# Patient Record
Sex: Female | Born: 1955 | Race: Black or African American | Hispanic: No | Smoking: Never smoker
Health system: Southern US, Community
[De-identification: ages and names within clinical notes are randomized; demographics above are authoritative.]

## PROBLEM LIST (undated history)

## (undated) DIAGNOSIS — Z8489 Family history of other specified conditions: Secondary | ICD-10-CM

## (undated) DIAGNOSIS — D649 Anemia, unspecified: Secondary | ICD-10-CM

## (undated) DIAGNOSIS — M542 Cervicalgia: Secondary | ICD-10-CM

## (undated) DIAGNOSIS — R51 Headache: Secondary | ICD-10-CM

## (undated) DIAGNOSIS — K219 Gastro-esophageal reflux disease without esophagitis: Secondary | ICD-10-CM

## (undated) DIAGNOSIS — I10 Essential (primary) hypertension: Secondary | ICD-10-CM

## (undated) DIAGNOSIS — K589 Irritable bowel syndrome without diarrhea: Secondary | ICD-10-CM

## (undated) DIAGNOSIS — I341 Nonrheumatic mitral (valve) prolapse: Secondary | ICD-10-CM

## (undated) DIAGNOSIS — O24419 Gestational diabetes mellitus in pregnancy, unspecified control: Secondary | ICD-10-CM

## (undated) DIAGNOSIS — G709 Myoneural disorder, unspecified: Secondary | ICD-10-CM

## (undated) DIAGNOSIS — F419 Anxiety disorder, unspecified: Secondary | ICD-10-CM

## (undated) DIAGNOSIS — R002 Palpitations: Secondary | ICD-10-CM

## (undated) HISTORY — DX: Essential (primary) hypertension: I10

## (undated) HISTORY — DX: Cervicalgia: M54.2

## (undated) HISTORY — DX: Irritable bowel syndrome, unspecified: K58.9

## (undated) HISTORY — DX: Headache: R51

## (undated) HISTORY — DX: Anxiety disorder, unspecified: F41.9

## (undated) HISTORY — DX: Gastro-esophageal reflux disease without esophagitis: K21.9

## (undated) HISTORY — PX: LAPAROSCOPY: SHX197

## (undated) HISTORY — DX: Gestational diabetes mellitus in pregnancy, unspecified control: O24.419

## (undated) HISTORY — DX: Nonrheumatic mitral (valve) prolapse: I34.1

## (undated) HISTORY — DX: Palpitations: R00.2

## (undated) HISTORY — DX: Anemia, unspecified: D64.9

## (undated) HISTORY — PX: OTHER SURGICAL HISTORY: SHX169

---

## 1997-11-22 ENCOUNTER — Other Ambulatory Visit: Admission: RE | Admit: 1997-11-22 | Discharge: 1997-11-22 | Payer: Self-pay | Admitting: *Deleted

## 1999-06-05 ENCOUNTER — Other Ambulatory Visit: Admission: RE | Admit: 1999-06-05 | Discharge: 1999-06-05 | Payer: Self-pay | Admitting: Gynecology

## 2000-07-08 ENCOUNTER — Encounter: Payer: Self-pay | Admitting: Pulmonary Disease

## 2000-07-08 ENCOUNTER — Ambulatory Visit (HOSPITAL_COMMUNITY): Admission: RE | Admit: 2000-07-08 | Discharge: 2000-07-08 | Payer: Self-pay | Admitting: Pulmonary Disease

## 2002-01-31 ENCOUNTER — Other Ambulatory Visit: Admission: RE | Admit: 2002-01-31 | Discharge: 2002-01-31 | Payer: Self-pay | Admitting: *Deleted

## 2003-03-14 ENCOUNTER — Other Ambulatory Visit: Admission: RE | Admit: 2003-03-14 | Discharge: 2003-03-14 | Payer: Self-pay | Admitting: *Deleted

## 2003-07-01 ENCOUNTER — Ambulatory Visit (HOSPITAL_COMMUNITY): Admission: RE | Admit: 2003-07-01 | Discharge: 2003-07-01 | Payer: Self-pay | Admitting: Gastroenterology

## 2004-01-05 HISTORY — PX: COLONOSCOPY: SHX174

## 2004-10-10 ENCOUNTER — Encounter: Admission: RE | Admit: 2004-10-10 | Discharge: 2004-10-10 | Payer: Self-pay | Admitting: *Deleted

## 2005-06-09 ENCOUNTER — Other Ambulatory Visit: Admission: RE | Admit: 2005-06-09 | Discharge: 2005-06-09 | Payer: Self-pay | Admitting: Obstetrics and Gynecology

## 2006-02-22 ENCOUNTER — Ambulatory Visit: Payer: Self-pay | Admitting: Pulmonary Disease

## 2006-03-01 ENCOUNTER — Ambulatory Visit: Payer: Self-pay | Admitting: Pulmonary Disease

## 2006-03-01 LAB — CONVERTED CEMR LAB
AST: 25 units/L (ref 0–37)
Albumin: 3.7 g/dL (ref 3.5–5.2)
Basophils Absolute: 0 10*3/uL (ref 0.0–0.1)
Bilirubin, Direct: 0.1 mg/dL (ref 0.0–0.3)
Chloride: 108 meq/L (ref 96–112)
Cholesterol: 184 mg/dL (ref 0–200)
Eosinophils Absolute: 0.1 10*3/uL (ref 0.0–0.6)
Eosinophils Relative: 3.8 % (ref 0.0–5.0)
GFR calc non Af Amer: 93 mL/min
Glucose, Bld: 97 mg/dL (ref 70–99)
HCT: 40.6 % (ref 36.0–46.0)
Hemoglobin: 14.2 g/dL (ref 12.0–15.0)
Lymphocytes Relative: 53.6 % — ABNORMAL HIGH (ref 12.0–46.0)
MCHC: 34.9 g/dL (ref 30.0–36.0)
MCV: 92.7 fL (ref 78.0–100.0)
Monocytes Absolute: 0.2 10*3/uL (ref 0.2–0.7)
Neutro Abs: 1.3 10*3/uL — ABNORMAL LOW (ref 1.4–7.7)
Neutrophils Relative %: 36.3 % — ABNORMAL LOW (ref 43.0–77.0)
Potassium: 4.6 meq/L (ref 3.5–5.1)
RBC: 4.37 M/uL (ref 3.87–5.11)
Sodium: 143 meq/L (ref 135–145)
TSH: 1.57 microintl units/mL (ref 0.35–5.50)
Total Bilirubin: 0.7 mg/dL (ref 0.3–1.2)
Total CHOL/HDL Ratio: 2.6
WBC: 3.5 10*3/uL — ABNORMAL LOW (ref 4.5–10.5)

## 2006-08-16 ENCOUNTER — Emergency Department (HOSPITAL_COMMUNITY): Admission: EM | Admit: 2006-08-16 | Discharge: 2006-08-16 | Payer: Self-pay | Admitting: Emergency Medicine

## 2007-03-24 DIAGNOSIS — K589 Irritable bowel syndrome without diarrhea: Secondary | ICD-10-CM | POA: Insufficient documentation

## 2007-03-24 DIAGNOSIS — F411 Generalized anxiety disorder: Secondary | ICD-10-CM | POA: Insufficient documentation

## 2007-03-24 DIAGNOSIS — J309 Allergic rhinitis, unspecified: Secondary | ICD-10-CM | POA: Insufficient documentation

## 2007-03-24 DIAGNOSIS — K219 Gastro-esophageal reflux disease without esophagitis: Secondary | ICD-10-CM | POA: Insufficient documentation

## 2007-03-24 DIAGNOSIS — Z8679 Personal history of other diseases of the circulatory system: Secondary | ICD-10-CM | POA: Insufficient documentation

## 2007-03-24 DIAGNOSIS — O99810 Abnormal glucose complicating pregnancy: Secondary | ICD-10-CM | POA: Insufficient documentation

## 2007-03-27 ENCOUNTER — Telehealth (INDEPENDENT_AMBULATORY_CARE_PROVIDER_SITE_OTHER): Payer: Self-pay | Admitting: *Deleted

## 2007-04-17 ENCOUNTER — Telehealth (INDEPENDENT_AMBULATORY_CARE_PROVIDER_SITE_OTHER): Payer: Self-pay | Admitting: *Deleted

## 2008-02-15 ENCOUNTER — Encounter: Payer: Self-pay | Admitting: Pulmonary Disease

## 2008-12-25 ENCOUNTER — Ambulatory Visit: Payer: Self-pay | Admitting: Pulmonary Disease

## 2008-12-25 DIAGNOSIS — M542 Cervicalgia: Secondary | ICD-10-CM | POA: Insufficient documentation

## 2008-12-25 DIAGNOSIS — K5900 Constipation, unspecified: Secondary | ICD-10-CM | POA: Insufficient documentation

## 2008-12-25 DIAGNOSIS — R002 Palpitations: Secondary | ICD-10-CM | POA: Insufficient documentation

## 2008-12-25 DIAGNOSIS — D649 Anemia, unspecified: Secondary | ICD-10-CM | POA: Insufficient documentation

## 2008-12-25 DIAGNOSIS — R519 Headache, unspecified: Secondary | ICD-10-CM | POA: Insufficient documentation

## 2008-12-25 DIAGNOSIS — R51 Headache: Secondary | ICD-10-CM | POA: Insufficient documentation

## 2009-01-14 ENCOUNTER — Emergency Department (HOSPITAL_COMMUNITY): Admission: EM | Admit: 2009-01-14 | Discharge: 2009-01-14 | Payer: Self-pay | Admitting: Emergency Medicine

## 2009-08-20 ENCOUNTER — Telehealth (INDEPENDENT_AMBULATORY_CARE_PROVIDER_SITE_OTHER): Payer: Self-pay | Admitting: *Deleted

## 2010-01-01 ENCOUNTER — Ambulatory Visit (HOSPITAL_COMMUNITY)
Admission: RE | Admit: 2010-01-01 | Discharge: 2010-01-01 | Payer: Self-pay | Source: Home / Self Care | Attending: Obstetrics and Gynecology | Admitting: Obstetrics and Gynecology

## 2010-02-03 NOTE — Progress Notes (Signed)
Summary: Company secretary Note From Other Clinic   Caller: Dr. Kriste Basque Call For: Dr. Kriste Basque Summary of Call: Received call from [place] with Dr. Milinda Hirschfeld.  States pt is currently in the dental chair but per Cala Bradford pt is stating she needs to take abx prior to dental appt for mitral valve prolapse.  Pt is there for probing of gum tissue and possible filling/crown.  Cala Bradford calling for clarrifiaction of this.   **Her number [phone] ask for her or Jodie.   Initial call taken by: Gweneth Dimitri RN,  August 20, 2009 11:42 AM  Follow-up for Phone Call        per SN: record reviewed.  dx of MVP.  does not need an abx.  called spoke with Jodie with SN's recs.  Jodie read this back to me and verbalized her understanding. Follow-up by: Boone Master CNA/MA,  August 20, 2009 12:03 PM

## 2010-02-17 ENCOUNTER — Telehealth (INDEPENDENT_AMBULATORY_CARE_PROVIDER_SITE_OTHER): Payer: Self-pay | Admitting: *Deleted

## 2010-02-19 ENCOUNTER — Ambulatory Visit: Payer: Self-pay | Admitting: Pulmonary Disease

## 2010-02-23 ENCOUNTER — Ambulatory Visit (INDEPENDENT_AMBULATORY_CARE_PROVIDER_SITE_OTHER): Payer: Managed Care, Other (non HMO) | Admitting: Pulmonary Disease

## 2010-02-23 ENCOUNTER — Encounter: Payer: Self-pay | Admitting: Pulmonary Disease

## 2010-02-23 DIAGNOSIS — Z8679 Personal history of other diseases of the circulatory system: Secondary | ICD-10-CM

## 2010-02-23 DIAGNOSIS — K219 Gastro-esophageal reflux disease without esophagitis: Secondary | ICD-10-CM

## 2010-02-23 DIAGNOSIS — D649 Anemia, unspecified: Secondary | ICD-10-CM

## 2010-02-23 DIAGNOSIS — R002 Palpitations: Secondary | ICD-10-CM

## 2010-02-23 DIAGNOSIS — J309 Allergic rhinitis, unspecified: Secondary | ICD-10-CM

## 2010-02-23 DIAGNOSIS — K5900 Constipation, unspecified: Secondary | ICD-10-CM

## 2010-02-23 DIAGNOSIS — K589 Irritable bowel syndrome without diarrhea: Secondary | ICD-10-CM

## 2010-02-23 DIAGNOSIS — F411 Generalized anxiety disorder: Secondary | ICD-10-CM

## 2010-02-25 NOTE — Progress Notes (Signed)
Summary: refill pt is out-LMTCB x 1  Phone Note Call from Patient Call back at [phone]   Caller: Patient Call For: nadel Summary of Call: needs refill on atenolol [facility] [address] pt is out  Initial call taken by: Lacinda Axon,  February 17, 2010 12:52 PM  Follow-up for Phone Call        Not seen since 2010, needs appt- LMTCB x 1 Vernie Murders  February 17, 2010 1:47 PM  Spoek with pt and notified needs ov and will refill meds to last until that date.  Pt verbalized understanding.  Appt was sched for 02/19/10 at 11:30 am and refilled atenolol to last until that date.  Follow-up by: Vernie Murders,  February 17, 2010 4:36 PM    Prescriptions: ATENOLOL 50 MG  TABS (ATENOLOL) take 1 tab by mouth once daily  #4 x 0   Entered by:   Vernie Murders   Authorized by:   Michele Mcalpine MD   Signed by:   Vernie Murders on 02/17/2010   Method used:   Electronically to        [facility]  [id number] ([facility])       [address]       [place], [place]  [address]       Ph: [phone] or [phone]       Fax: [phone]   RxID:   [id number]

## 2010-02-27 ENCOUNTER — Other Ambulatory Visit: Payer: Managed Care, Other (non HMO)

## 2010-03-12 NOTE — Assessment & Plan Note (Signed)
Summary: followup//lmr   CC:  14 month ROV & review of mult medical problems....  History of Present Illness: 55 y/o BF here for a follow up visit...    ~  December 25, 2008:  she was last seen 2/08 for refill of her Atenolol for palpit... she has been out of work & still no insurance for full eval or CPX... she notes that she has been doing satis overall- no new complaints or concerns... she would like to get the Aten refilled & she uses OTC Prevacid, Calcium, MVI, Vit D...   ~  February 23, 2010:  46mo ROV- CC not resting well & fair energy;  requests refills for 90d supplies...    Hx MVP w/ occas palpit on Aten50 & doing well she notes;  using Pev15 OTC Prn for reflux symptoms;  GI per [facility] & pt encouraged to f/u w/ her egarding routine screening colonoscopy;  she still sees DrStringer for GYN- Pap, Mammogram, etc are all OK per pt hx;  remote hx neck pain & HAs but doing well w/o recurrent problems apparent;  ?prev hx MS? & she denies neuro problems at this time... she is requested to go to the lab for FASTING blood work, but she declined to do these tests for $$ reasons...   Current Problems:   ALLERGIC RHINITIS (ICD-477.9) - she uses OTC antihist Prn...  MITRAL VALVE PROLAPSE, HX OF (ICD-V12.50), & PALPITATIONS (ICD-785.1) - on ATENOLOL 50mg /d... prev Holter monitor 2005 showed PAC's/ PVC's...  GERD (ICD-530.81) - prev on Aciphex 20mg /d... eval by DrNat-Mann  in 2006 w/ ?EGD at [facility] (we don't have records)... she had EGD 8/02 by Dorris Singh w/ esophagiis seen...  ~  1/10:  she wants cheaper med- try PREVACID 24H OTC daily...   IRRITABLE BOWEL SYNDROME (ICD-564.1), & CONSTIPATION (ICD-564.00) - eval by DrNat-Man 2006.  ~  2/12:  pt is requested to contact DrNat-Mann for needed screening colonoscopy...  Hx of GESTATIONAL DIABETES (ICD-648.80) - GYN= DrStringer & seen 2009 w/ PAP, Mammogram, & Rx w/ Calcium, MVI, Vit D...  ? of MULTIPLE SCLEROSIS (ICD-340) - she presented w/  dysesthesias in 1995 w/ eval by DrSteifel... MRI said to be compatible w/ MS & hosp for IV Solumedrol at that time... hx of being seen at [facility] in the [facility] w/ LP that was also consistant w/ MS... no problems since then x occas sensory symptoms- DrStiefel released her in 1997...  Hx of HEADACHE (ICD-784.0) - eval by drAdelman 1993- neg MRI x ectopic Cbll tissue extending below the level of the foramen magnum... Rx'd w/ SOMA & improved...  Hx of NECK PAIN (ICD-723.1) - she developed neck pain 2007 w/ eval DrKramer for Ortho, and saw DrJenkins for Neurosurg- Dx'd cerv strain & MRI showed few disc bulges & signal abnormalities r/o demyelinating process like MS (she didn't tell DrJenkins about her prev hx as above)...  ANXIETY (ICD-300.00)  Hx of ANEMIA (ICD-285.9)  ~  labs 2/08 showed Hg= 14.2  Health Maintenance - takes OMEGA-3 Fish Oil   Preventive Screening-Counseling & Management  Alcohol-Tobacco     Smoking Status: quit  Comments: pt stated that she tried smoking as a teenager but has not since then  Allergies: 1)  ! Aspirin 2)  ! Cipro  Comments:  Nurse/Medical Assistant: The patient's medications and allergies were reviewed with the patient and were updated in the Medication and Allergy Lists.  Past History:  Past Medical History: ALLERGIC RHINITIS (ICD-477.9) MITRAL VALVE PROLAPSE, HX OF (ICD-V12.50) PALPITATIONS (  ICD-785.1) GERD (ICD-530.81) IRRITABLE BOWEL SYNDROME (ICD-564.1) CONSTIPATION (ICD-564.00) Hx of GESTATIONAL DIABETES (ICD-648.80) ? of MULTIPLE SCLEROSIS (ICD-340) Hx of HEADACHE (ICD-784.0) Hx of NECK PAIN (ICD-723.1) ANXIETY (ICD-300.00) Hx of ANEMIA (ICD-285.9)  Family History: Reviewed history and no changes required.  Social History: Reviewed history and no changes required.  Review of Systems      See HPI  The patient denies anorexia, fever, weight loss, weight gain, vision loss, decreased hearing, hoarseness, chest pain, syncope, dyspnea  on exertion, peripheral edema, prolonged cough, headaches, hemoptysis, abdominal pain, melena, hematochezia, severe indigestion/heartburn, hematuria, incontinence, muscle weakness, suspicious skin lesions, transient blindness, difficulty walking, depression, unusual weight change, abnormal bleeding, enlarged lymph nodes, and angioedema.    Vital Signs:  Patient profile:   55 year old female Height:      62.5 inches Weight:      143 pounds BMI:     25.83 O2 Sat:      100 % on Room air Temp:     96.7 degrees F oral Pulse rate:   55 / minute BP sitting:   120 / 82  (left arm) Cuff size:   regular  Vitals Entered By: Randell Loop CMA (February 23, 2010 11:41 AM)  O2 Sat at Rest %:  100 O2 Flow:  Room air CC: 14 month ROV & review of mult medical problems... Is Patient Diabetic? No Pain Assessment Patient in pain? no      Comments no changes in meds today   Physical Exam  Additional Exam:  WD, WN, 55 y/o BF in NAD... GENERAL:  Alert & oriented; pleasant & cooperative... HEENT:  [place]/AT, EOM-wnl, PERRLA, EACs-clear, TMs-wnl, NOSE-clear, THROAT-clear & wnl. NECK:  Supple w/ full ROM; no JVD; normal carotid impulses w/o bruits; no thyromegaly or nodules palpated; no lymphadenopathy. CHEST:  Clear to P & A; without wheezes/ rales/ or rhonchi. HEART:  Regular Rhythm; without murmurs/ rubs/ or gallops. ABDOMEN:  Soft & nontender; normal bowel sounds; no organomegaly or masses detected. EXT: without deformities or arthritic changes; no varicose veins/ venous insuffic/ or edema. NEURO:  CN's intact; motor testing normal; sensory testing normal; gait normal & balance OK. DERM:  No lesions noted; no rash etc...    Impression & Recommendations:  Problem # 1:  PALPITATIONS (ICD-785.1) She has been stable on Aten50> w/o recurrent palpit... she wishes to contiue as is & requests refill perscription- OK. Her updated medication list for this problem includes:    Atenolol 50 Mg Tabs  (Atenolol) .Marland Kitchen... Take 1 tab by mouth once daily  Problem # 2:  GERD (ICD-530.81) She uses OTC Prev15 as needed... Her updated medication list for this problem includes:    Prevacid 24hr 15 Mg Cpdr (Lansoprazole) .Marland Kitchen... Take one tab by mouth once daily as directed for stomach acid...  Problem # 3:  IRRITABLE BOWEL SYNDROME (ICD-564.1) She is reminded to contact DrNat-Mann, GI > for needed screening colonoscopy...  Problem # 4:  ? of MULTIPLE SCLEROSIS (ICD-340) She deies Neuro sensory or motor symptoms...  Problem # 5:  OTHER MEDICAL DIAGNOSES AS LISTED>>>  Complete Medication List: 1)  Atenolol 50 Mg Tabs (Atenolol) .... Take 1 tab by mouth once daily 2)  Prevacid 24hr 15 Mg Cpdr (Lansoprazole) .... Take one tab by mouth once daily as directed for stomach acid.Marland KitchenMarland Kitchen 3)  Caltrate 600+d Plus 600-400 Mg-unit Tabs (Calcium carbonate-vit d-min) .... Take 1 tab daily.Marland KitchenMarland Kitchen 4)  Womens Multivitamin Plus Tabs (Multiple vitamins-minerals) .... Take 1 tab by mouth once daily.Marland KitchenMarland Kitchen  Patient Instructions: 1)  Today we updated your med list- see below.... 2)  We refilled your ATENOLOL & discussed slowly tapering this medication.Marland KitchenMarland Kitchen 3)  Remember> no caffeine... 4)  Please return to our lab one morning soon for your FASTING blood work... please call the "phone tree" in a few days for your lab results.Marland KitchenMarland Kitchen  5)  Call for any problems.Marland KitchenMarland Kitchen 6)  Please schedule a follow-up appointment in 1 year. Prescriptions: ATENOLOL 50 MG  TABS (ATENOLOL) take 1 tab by mouth once daily  #90 x 4   Entered and Authorized by:   Michele Mcalpine MD   Signed by:   Michele Mcalpine MD on 02/23/2010   Method used:   Print then Give to Patient   RxID:   [id number]    Immunization History:  Influenza Immunization History:    Influenza:  declined (02/23/2010)  Pneumovax Immunization History:    Pneumovax:  declined (02/23/2010)

## 2010-03-17 ENCOUNTER — Encounter: Payer: Self-pay | Admitting: Pulmonary Disease

## 2010-03-17 ENCOUNTER — Encounter (INDEPENDENT_AMBULATORY_CARE_PROVIDER_SITE_OTHER): Payer: Self-pay | Admitting: *Deleted

## 2010-03-17 ENCOUNTER — Other Ambulatory Visit: Payer: Managed Care, Other (non HMO)

## 2010-03-17 ENCOUNTER — Other Ambulatory Visit: Payer: Self-pay | Admitting: Pulmonary Disease

## 2010-03-17 DIAGNOSIS — E039 Hypothyroidism, unspecified: Secondary | ICD-10-CM

## 2010-03-17 DIAGNOSIS — D649 Anemia, unspecified: Secondary | ICD-10-CM

## 2010-03-17 DIAGNOSIS — E785 Hyperlipidemia, unspecified: Secondary | ICD-10-CM

## 2010-03-17 DIAGNOSIS — R748 Abnormal levels of other serum enzymes: Secondary | ICD-10-CM

## 2010-03-17 DIAGNOSIS — I10 Essential (primary) hypertension: Secondary | ICD-10-CM

## 2010-03-17 DIAGNOSIS — E7800 Pure hypercholesterolemia, unspecified: Secondary | ICD-10-CM

## 2010-03-17 LAB — CBC WITH DIFFERENTIAL/PLATELET
Eosinophils Relative: 2.2 % (ref 0.0–5.0)
Lymphocytes Relative: 56.9 % — ABNORMAL HIGH (ref 12.0–46.0)
Monocytes Relative: 7.3 % (ref 3.0–12.0)
Neutrophils Relative %: 32.9 % — ABNORMAL LOW (ref 43.0–77.0)
Platelets: 193 10*3/uL (ref 150.0–400.0)
RBC: 4.5 Mil/uL (ref 3.87–5.11)
WBC: 3.2 10*3/uL — ABNORMAL LOW (ref 4.5–10.5)

## 2010-03-17 LAB — HEPATIC FUNCTION PANEL
ALT: 17 U/L (ref 0–35)
AST: 21 U/L (ref 0–37)
Albumin: 4.1 g/dL (ref 3.5–5.2)

## 2010-03-17 LAB — BASIC METABOLIC PANEL
BUN: 18 mg/dL (ref 6–23)
Chloride: 107 mEq/L (ref 96–112)
Creatinine, Ser: 0.6 mg/dL (ref 0.4–1.2)
Glucose, Bld: 93 mg/dL (ref 70–99)
Potassium: 4.9 mEq/L (ref 3.5–5.1)

## 2010-03-17 LAB — LIPID PANEL
Cholesterol: 206 mg/dL — ABNORMAL HIGH (ref 0–200)
HDL: 75.6 mg/dL (ref >39.00)

## 2010-03-17 LAB — LDL CHOLESTEROL, DIRECT: Direct LDL: 109.3 mg/dL

## 2010-03-17 LAB — TSH: TSH: 1.35 u[IU]/mL (ref 0.35–5.50)

## 2010-03-22 LAB — POCT URINALYSIS DIP (DEVICE)
Bilirubin Urine: NEGATIVE
Glucose, UA: NEGATIVE mg/dL
Ketones, ur: NEGATIVE mg/dL
Protein, ur: NEGATIVE mg/dL

## 2010-10-19 LAB — DIFFERENTIAL
Basophils Relative: 0
Lymphs Abs: 1.6
Monocytes Absolute: 0.4
Monocytes Relative: 4
Neutro Abs: 8.5 — ABNORMAL HIGH

## 2010-10-19 LAB — I-STAT 8, (EC8 V) (CONVERTED LAB)
Acid-Base Excess: 3 — ABNORMAL HIGH
BUN: 6
Chloride: 103
HCT: 49 — ABNORMAL HIGH
Hemoglobin: 16.7 — ABNORMAL HIGH
Operator id: 146091
Potassium: 4.3
pCO2, Ven: 49.1

## 2010-10-19 LAB — CBC
Hemoglobin: 16 — ABNORMAL HIGH
MCHC: 35
MCV: 92
RBC: 4.97
WBC: 10.6 — ABNORMAL HIGH

## 2010-10-19 LAB — POCT I-STAT CREATININE: Creatinine, Ser: 0.8

## 2011-02-24 ENCOUNTER — Ambulatory Visit: Payer: Managed Care, Other (non HMO) | Admitting: Pulmonary Disease

## 2011-03-21 ENCOUNTER — Other Ambulatory Visit: Payer: Self-pay | Admitting: Pulmonary Disease

## 2011-04-05 ENCOUNTER — Encounter: Payer: Self-pay | Admitting: Pulmonary Disease

## 2011-04-05 ENCOUNTER — Other Ambulatory Visit: Payer: Self-pay | Admitting: Obstetrics and Gynecology

## 2011-04-05 ENCOUNTER — Ambulatory Visit (INDEPENDENT_AMBULATORY_CARE_PROVIDER_SITE_OTHER): Payer: Managed Care, Other (non HMO) | Admitting: Pulmonary Disease

## 2011-04-05 VITALS — BP 134/78 | HR 56 | Temp 99.2°F | Ht 62.5 in | Wt 148.4 lb

## 2011-04-05 DIAGNOSIS — Z0000 Encounter for general adult medical examination without abnormal findings: Secondary | ICD-10-CM

## 2011-04-05 DIAGNOSIS — Z8679 Personal history of other diseases of the circulatory system: Secondary | ICD-10-CM

## 2011-04-05 DIAGNOSIS — K589 Irritable bowel syndrome without diarrhea: Secondary | ICD-10-CM

## 2011-04-05 DIAGNOSIS — F411 Generalized anxiety disorder: Secondary | ICD-10-CM

## 2011-04-05 DIAGNOSIS — K219 Gastro-esophageal reflux disease without esophagitis: Secondary | ICD-10-CM

## 2011-04-05 DIAGNOSIS — R002 Palpitations: Secondary | ICD-10-CM

## 2011-04-05 MED ORDER — ALPRAZOLAM 0.5 MG PO TABS: ORAL_TABLET | ORAL | Status: DC

## 2011-04-05 MED ORDER — ATENOLOL 50 MG PO TABS: 50.0000 mg | ORAL_TABLET | Freq: Every day | ORAL | Status: DC

## 2011-04-05 NOTE — Progress Notes (Addendum)
Subjective:     Patient ID: Janice Gross, female   DOB: [birth year], 56 y.o.   MRN: [id number]  HPI 56 y/o BF here for a follow up visit...   ~  December 25, 2008:  she was last seen 2/08 for refill of her Atenolol for palpit... she has been out of work & still no insurance for full eval or CPX... she notes that she has been doing satis overall- no new complaints or concerns... she would like to get the Aten refilled & she uses OTC Prevacid, Calcium, MVI, Vit D...  ~  February 23, 2010:  1mo ROV- CC not resting well & fair energy;  requests refills for 90d supplies...    Hx MVP w/ occas palpit on Aten50 & doing well she notes;  using Pev15 OTC Prn for reflux symptoms;  GI per [facility] & pt encouraged to f/u w/ her egarding routine screening colonoscopy;  she still sees DrStringer for GYN- Pap, Mammogram, etc are all OK per pt hx;  remote hx neck pain & HAs but doing well w/o recurrent problems apparent;  ?prev hx MS? & she denies neuro problems at this time... she is requested to go to the lab for FASTING blood work, but she declined to do these tests for $$ reasons...  ~  April 05, 2011:  86mo ROV & her CC is insomnia w/ difficulty initiating & maintaining sleep> states she averages 4H sleep per night; we reviewed sleep hygiene recommendations & OTC Rx... She is also anxious & we decided to try ALPRAZOLAM 0.5mg  1/2 to 1 tab po Tid for this & her sleep related issue...    She is c/o persistent palpit & has prev been rec to avoid caffeine, nicotine, pseudophed, etc;  ATENOLOL 50mg - 1/2 tab prn helps she says...     We reviewed prob list, meds, xrays and lack of lab data- she refuses due to cost issues> see below for updates >> EKG 4/13 showed SBrady, rate52, WNL, NAD...   Problem List:    ALLERGIC RHINITIS (ICD-477.9) - she uses OTC antihist Prn...  MITRAL VALVE PROLAPSE, HX OF (ICD-V12.50), & PALPITATIONS (ICD-785.1) - on ATENOLOL 50mg - takes 1/2 daily & extra 1/2 prn palpit... prev Holter  monitor 2005 showed PAC's/ PVC's... ~  EKG 4/13 showed SBrady, rate52, WNL, NAD...  GERD (ICD-530.81) - prev on Aciphex 20mg /d... eval by DrNat-Mann  in 2006 w/ ?EGD at [facility] (we don't have records)... she had EGD 8/02 by Dorris Singh w/ esophagiis seen... ~  1/10:  she wants cheaper med- try PREVACID 24H OTC daily...   IRRITABLE BOWEL SYNDROME (ICD-564.1), & CONSTIPATION (ICD-564.00) - eval by DrNat-Man 2006. ~  2/12:  pt is requested to contact DrNat-Mann for needed screening colonoscopy...  Hx of GESTATIONAL DIABETES (ICD-648.80) - GYN= DrStringer & seen 2009 w/ PAP, Mammogram, & Rx w/ Calcium, MVI, Vit D...  ? of MULTIPLE SCLEROSIS (ICD-340) - she presented w/ dysesthesias in 1995 w/ eval by DrSteifel... MRI said to be compatible w/ MS & hosp for IV Solumedrol at that time... hx of being seen at [facility] in the 1980's w/ LP that was also consistant w/ MS... no problems since then x occas sensory symptoms- DrStiefel released her in 1997...  Hx of HEADACHE (ICD-784.0) - eval by drAdelman 1993- neg MRI x ectopic Cbll tissue extending below the level of the foramen magnum... Rx'd w/ SOMA & improved...  Hx of NECK PAIN (ICD-723.1) - she developed neck pain 2007 w/ eval DrKramer for Ortho, and  saw DrJenkins for Neurosurg- Dx'd cerv strain & MRI showed few disc bulges & signal abnormalities r/o demyelinating process like MS (she didn't tell DrJenkins about her prev hx as above)...  ANXIETY (ICD-300.00) - we prescribed ALPRAZOLAM 0.5mg  1/2 to 1 tab Tid as needed...  Hx of ANEMIA (ICD-285.9) ~  labs 2/08 showed Hg= 14.2  Health Maintenance - takes OMEGA-3 Fish Oil   Past Surgical History  Procedure Date  . Surgery to remove ovarian cyst   . C section x 1   . Laparoscopy   . Colonoscopy     Outpatient Encounter Prescriptions as of 04/05/2011  Medication Sig Dispense Refill  . atenolol (TENORMIN) 50 MG tablet Take 1 tablet (50 mg total) by mouth daily.  90 tablet  3  . Biotin (BIOTIN 5000) 5  MG CAPS Take 1 capsule by mouth daily.      . Calcium Carbonate-Vitamin D (CALTRATE 600+D) 600-400 MG-UNIT per tablet Take 1 tablet by mouth daily.      . fish oil-omega-3 fatty acids 1000 MG capsule Take 2 g by mouth daily.      . Multiple Vitamins-Minerals (WOMENS MULTIVITAMIN PLUS PO) Take 1 tablet by mouth daily.      . lansoprazole (PREVACID) 15 MG capsule Take 15 mg by mouth daily.        Allergies  Allergen Reactions  . Aspirin     Makes her lower back hurt  . Ciprofloxacin   . Iodine     shaking    Current Medications, Allergies, Past Medical History, Past Surgical History, Family History, and Social History were reviewed in [employer] record.   Review of Systems        See HPI - all other systems neg except as noted...  The patient denies anorexia, fever, weight loss, weight gain, vision loss, decreased hearing, hoarseness, chest pain, syncope, dyspnea on exertion, peripheral edema, prolonged cough, headaches, hemoptysis, abdominal pain, melena, hematochezia, severe indigestion/heartburn, hematuria, incontinence, muscle weakness, suspicious skin lesions, transient blindness, difficulty walking, depression, unusual weight change, abnormal bleeding, enlarged lymph nodes, and angioedema.     Objective:   Physical Exam     WD, WN, 55 y/o BF in NAD... GENERAL:  Alert & oriented; pleasant & cooperative... HEENT:  [place]/AT, EOM-wnl, PERRLA, EACs-clear, TMs-wnl, NOSE-clear, THROAT-clear & wnl. NECK:  Supple w/ full ROM; no JVD; normal carotid impulses w/o bruits; no thyromegaly or nodules palpated; no lymphadenopathy. CHEST:  Clear to P & A; without wheezes/ rales/ or rhonchi. HEART:  Regular Rhythm; without murmurs/ rubs/ or gallops. ABDOMEN:  Soft & nontender; normal bowel sounds; no organomegaly or masses detected. EXT: without deformities or arthritic changes; no varicose veins/ venous insuffic/ or edema. NEURO:  CN's intact; motor testing normal; sensory  testing normal; gait normal & balance OK. DERM:  No lesions noted; no rash etc...  RADIOLOGY DATA:  Reviewed in the EPIC EMR & discussed w/ the patient...  LABORATORY DATA:  Reviewed in the EPIC EMR & discussed w/ the patient...   Assessment:     Hx AR>  OK on OTC meds but reminded to avoid pseudophed etc...  MVP/ Palpit>  Hx Pacs & PVCs in the past; advised to avoid caffeine, nicotine, pseudophed, etc; on Atenolol 50- 1/2 to 1 daily...  GERD>  Prev on Prevacid & it helped; discussed OTC Prilosec/ Prevacid/ H2Blockers etc...  IBS, Constip>  Followed by [facility]; it is apparent she still needs screening colonoscopy & asked to contact her for this procedure...  ?  MS>  Apparently w/o any neuromusc issues since 1997 & remains asymptomatic...  Hx HAs>  Prev eval by [facility]; prev Rx w/ Soma350 & improved...  Hx Neck Pain>  Eval by DrKramer for Ortho in past, also DrJenkins for NS...  Anxiety>  On ALPRAZOLAM for prn use...    Plan:     Patient's Medications  New Prescriptions   ALPRAZOLAM (XANAX) 0.5 MG TABLET    Take 1/2 to 1 tablet by mouth three times daily as needed  Previous Medications   BIOTIN (BIOTIN 5000) 5 MG CAPS    Take 1 capsule by mouth daily.   CALCIUM CARBONATE-VITAMIN D (CALTRATE 600+D) 600-400 MG-UNIT PER TABLET    Take 1 tablet by mouth daily.   FISH OIL-OMEGA-3 FATTY ACIDS 1000 MG CAPSULE    Take 2 g by mouth daily.   LANSOPRAZOLE (PREVACID) 15 MG CAPSULE    Take 15 mg by mouth daily.   MULTIPLE VITAMINS-MINERALS (WOMENS MULTIVITAMIN PLUS PO)    Take 1 tablet by mouth daily.  Modified Medications   Modified Medication Previous Medication   ATENOLOL (TENORMIN) 50 MG TABLET atenolol (TENORMIN) 50 MG tablet      Take 1 tablet (50 mg total) by mouth daily.    TAKE 1 TAB BY MOUTH ONCE DAILY  Discontinued Medications   No medications on file

## 2011-04-05 NOTE — Patient Instructions (Addendum)
Today we updated your med list in our EPIC system...    Continue your current medications the same...    We wrote a new prescription for ALPRAZOLAM 0.5mg  to take 1/2 to 1 tab  Up to 3 times daily as needed...  Call for any problems...  Let's plan a follow up eval in 1 yrs time, call sooner if needed for any reason.Marland KitchenMarland Kitchen

## 2011-04-13 ENCOUNTER — Other Ambulatory Visit: Payer: Self-pay | Admitting: Obstetrics and Gynecology

## 2011-04-13 DIAGNOSIS — Z1231 Encounter for screening mammogram for malignant neoplasm of breast: Secondary | ICD-10-CM

## 2011-04-19 ENCOUNTER — Ambulatory Visit: Payer: Self-pay | Admitting: Obstetrics and Gynecology

## 2011-05-07 ENCOUNTER — Ambulatory Visit (HOSPITAL_COMMUNITY)
Admission: RE | Admit: 2011-05-07 | Discharge: 2011-05-07 | Disposition: A | Discharge status: Home / Self Care | Payer: Self-pay | Source: Ambulatory Visit | Attending: Obstetrics and Gynecology | Admitting: Obstetrics and Gynecology

## 2011-05-07 DIAGNOSIS — Z1231 Encounter for screening mammogram for malignant neoplasm of breast: Secondary | ICD-10-CM

## 2011-06-07 ENCOUNTER — Ambulatory Visit: Payer: Self-pay | Admitting: Obstetrics and Gynecology

## 2011-06-08 ENCOUNTER — Encounter: Payer: Self-pay | Admitting: Obstetrics and Gynecology

## 2011-07-01 ENCOUNTER — Ambulatory Visit: Payer: Self-pay | Admitting: Obstetrics and Gynecology

## 2011-07-02 ENCOUNTER — Encounter: Payer: Self-pay | Admitting: Obstetrics and Gynecology

## 2011-07-02 DIAGNOSIS — B379 Candidiasis, unspecified: Secondary | ICD-10-CM | POA: Insufficient documentation

## 2011-07-05 ENCOUNTER — Ambulatory Visit (INDEPENDENT_AMBULATORY_CARE_PROVIDER_SITE_OTHER): Payer: Commercial Indemnity | Admitting: Obstetrics and Gynecology

## 2011-07-05 ENCOUNTER — Encounter: Payer: Self-pay | Admitting: Obstetrics and Gynecology

## 2011-07-05 VITALS — BP 124/70 | Temp 98.6°F | Resp 16 | Ht 61.0 in | Wt 146.0 lb

## 2011-07-05 DIAGNOSIS — Z01419 Encounter for gynecological examination (general) (routine) without abnormal findings: Secondary | ICD-10-CM

## 2011-07-05 DIAGNOSIS — Z803 Family history of malignant neoplasm of breast: Secondary | ICD-10-CM

## 2011-07-05 DIAGNOSIS — N952 Postmenopausal atrophic vaginitis: Secondary | ICD-10-CM

## 2011-07-05 DIAGNOSIS — E663 Overweight: Secondary | ICD-10-CM

## 2011-07-05 MED ORDER — ESTRADIOL 10 MCG VA TABS: 10.0000 ug | ORAL_TABLET | VAGINAL | Status: DC

## 2011-07-05 NOTE — Progress Notes (Signed)
Last Pap: 03/2008 WNL: Yes Regular Periods:no Contraception: None  Monthly Breast exam:yes Tetanus<46yrs:no Nl.Bladder Function:no Daily BMs:yes Healthy Diet:yes Calcium:yes Mammogram:yes Date of Mammogram: 05/2011-WNL Exercise:yes How often exercise: Walks everyday Seatbelt: yes Abuse at home: no Stressful work:yes Sigmoid-colonoscopy: Yes/2008 Bone Density: No PCP: Dr. Kriste Basque Change in PMH: None Change in [facility]: Sister has breast cancer  *C/O vaginal dryness & wants to discuss family hx of breast cancer  Subjective:    Janice Gross is a 56 y.o. female G1P1 who presents for annual exam. The patient complains of vaginal dryness.  Her insomnia has improved. The patient's mother and 2 sisters have been diagnosed with breast cancer.  The diagnoses was made with each was at the 58 years of age.  The patient has several female cousins with breast cancer.  She has a history of anxiety.  She is doing well on her medication.  The following portions of the patient's history were reviewed and updated as appropriate: allergies, current medications, past family history, past medical history, past social history, past surgical history and problem list. See above.  Review of Systems Pertinent items are noted in HPI. Gastrointestinal:No change in bowel habits, no abdominal pain, no rectal bleeding Genitourinary:negative for dysuria, frequency, hematuria, nocturia and urinary incontinence    Objective:     BP 124/70  Temp 98.6 F (37 C)  Resp 16  Ht 5\' 1"  (1.549 m)  Wt 146 lb (66.225 kg)  BMI 27.59 kg/m2  Weight:  Wt Readings from Last 1 Encounters:  07/05/11 146 lb (66.225 kg)     BMI: Body mass index is 27.59 kg/(m^2). General Appearance: Alert, appropriate appearance for age. No acute distress HEENT: Grossly normal Neck / Thyroid: Supple, no masses, nodes or enlargement Lungs: clear to auscultation bilaterally Back: No CVA tenderness Breast Exam: No masses or nodes.No  dimpling, nipple retraction or discharge. Cardiovascular: Regular rate and rhythm. S1, S2, no murmur Gastrointestinal: Soft, non-tender, no masses or organomegaly  ++++++++++++++++++++++++++++++++++++++++++++++++++++++++  Pelvic Exam: External genitalia: normal general appearance Vaginal: atrophic mucosa Cervix: normal appearance Adnexa: normal bimanual exam Uterus: normal size shape and consistency Rectovaginal: normal rectal, no masses  ++++++++++++++++++++++++++++++++++++++++++++++++++++++++  Lymphatic Exam: Non-palpable nodes in neck, clavicular, axillary, or inguinal regions  Psychiatric: Alert and oriented, appropriate affect.    Urinalysis:Not done      Assessment:    Normal gyn exam   Atrophic vaginitis  Mother, 2 sisters, and cousins with breast cancer  Overweight or obese: Yes  Pelvic relaxation: yes  Menopausal symptoms: Yes. Severe: Yes.   Plan:   Order genetic counseling because of the strong family history of breast cancer. BRCA testing discussed.  Vagifem 10 g twice each week per vagina  Follow-up:  for annual exam  STD screen request: none,  RPR: No,  HBsAg: No.  Hepatitis C: No.  The updated Pap smear screening guidelines were discussed with the patient. The patient requested that I obtain a Pap smear: No.  Kegel exercises discussed: Yes.  Proper diet and regular exercise were reviewed.  Annual mammograms recommended starting at age 60. Proper breast care was discussed.  Screening colonoscopy is recommended beginning at age 37.  Regular health maintenance was reviewed.  Sleep hygiene was discussed.  Adequate calcium and vitamin D intake was emphasized.  Mylinda Latina.D.

## 2011-09-30 ENCOUNTER — Telehealth: Payer: Self-pay | Admitting: Obstetrics and Gynecology

## 2011-09-30 NOTE — Telephone Encounter (Signed)
LM for pt that she will need eval for ? Yeast inf. Prior to any meds being prescribed. I rec that she try OTC preparations for her sx's, or for her to cb to sced OV. Melody Comas A

## 2012-04-04 ENCOUNTER — Encounter: Payer: Self-pay | Admitting: Pulmonary Disease

## 2012-04-04 ENCOUNTER — Encounter: Payer: Self-pay | Admitting: Gastroenterology

## 2012-04-04 ENCOUNTER — Ambulatory Visit (INDEPENDENT_AMBULATORY_CARE_PROVIDER_SITE_OTHER)
Admission: RE | Admit: 2012-04-04 | Discharge: 2012-04-04 | Disposition: A | Discharge status: Home / Self Care | Payer: PRIVATE HEALTH INSURANCE | Source: Ambulatory Visit | Attending: Pulmonary Disease | Admitting: Pulmonary Disease

## 2012-04-04 ENCOUNTER — Other Ambulatory Visit (INDEPENDENT_AMBULATORY_CARE_PROVIDER_SITE_OTHER): Payer: PRIVATE HEALTH INSURANCE

## 2012-04-04 ENCOUNTER — Ambulatory Visit (INDEPENDENT_AMBULATORY_CARE_PROVIDER_SITE_OTHER): Payer: PRIVATE HEALTH INSURANCE | Admitting: Pulmonary Disease

## 2012-04-04 VITALS — BP 130/82 | HR 73 | Temp 98.2°F | Ht 62.5 in | Wt 158.8 lb

## 2012-04-04 DIAGNOSIS — Z8669 Personal history of other diseases of the nervous system and sense organs: Secondary | ICD-10-CM

## 2012-04-04 DIAGNOSIS — K589 Irritable bowel syndrome without diarrhea: Secondary | ICD-10-CM

## 2012-04-04 DIAGNOSIS — K219 Gastro-esophageal reflux disease without esophagitis: Secondary | ICD-10-CM

## 2012-04-04 DIAGNOSIS — F411 Generalized anxiety disorder: Secondary | ICD-10-CM

## 2012-04-04 DIAGNOSIS — Z0000 Encounter for general adult medical examination without abnormal findings: Secondary | ICD-10-CM

## 2012-04-04 DIAGNOSIS — K5900 Constipation, unspecified: Secondary | ICD-10-CM

## 2012-04-04 DIAGNOSIS — Z8679 Personal history of other diseases of the circulatory system: Secondary | ICD-10-CM

## 2012-04-04 DIAGNOSIS — R002 Palpitations: Secondary | ICD-10-CM

## 2012-04-04 LAB — CBC WITH DIFFERENTIAL/PLATELET
Basophils Relative: 0.7 % (ref 0.0–3.0)
Eosinophils Relative: 1.3 % (ref 0.0–5.0)
Hemoglobin: 13.9 g/dL (ref 12.0–15.0)
MCV: 91.8 fl (ref 78.0–100.0)
Monocytes Absolute: 0.3 10*3/uL (ref 0.1–1.0)
Neutro Abs: 2.3 10*3/uL (ref 1.4–7.7)
Neutrophils Relative %: 43 % (ref 43.0–77.0)
RBC: 4.39 Mil/uL (ref 3.87–5.11)
WBC: 5.3 10*3/uL (ref 4.5–10.5)

## 2012-04-04 LAB — BASIC METABOLIC PANEL
BUN: 18 mg/dL (ref 6–23)
Calcium: 9.3 mg/dL (ref 8.4–10.5)
Creatinine, Ser: 0.8 mg/dL (ref 0.4–1.2)
GFR: 89.19 mL/min (ref >60.00)

## 2012-04-04 LAB — HEPATIC FUNCTION PANEL
ALT: 32 U/L (ref 0–35)
Bilirubin, Direct: 0.1 mg/dL (ref 0.0–0.3)
Total Bilirubin: 0.6 mg/dL (ref 0.3–1.2)

## 2012-04-04 MED ORDER — ALPRAZOLAM 0.5 MG PO TABS: ORAL_TABLET | ORAL | Status: DC

## 2012-04-04 MED ORDER — ATENOLOL 50 MG PO TABS: ORAL_TABLET | ORAL | Status: DC

## 2012-04-04 NOTE — Patient Instructions (Addendum)
Today we updated your med list in our EPIC system...    Continue your current medications the same...    We refilled your meds per request...  For the Abdominal symptoms>     Try the acid suppressor Prevacid or Prilosec daily about 30 min before the 1st meal of the day...    Take the [facility] colon health pill daily as well...    Try the OTC PHAZYME- one tab 4 times daily- w/ meals and at bedtime...  We will arrange for a GI appt w/ DrKaplan...  Today we did your follow up CXR & non-fasting blood work...    We will contact you w/ the results when available...   Let's get on track w/ our diet and exercise program...  Call for any questions...  Let's plan a follow up visit in 89yr, sooner if needed for problems.Marland KitchenMarland Kitchen

## 2012-04-04 NOTE — Progress Notes (Signed)
Subjective:     Patient ID: Janice Gross, female   DOB: [birth year], 57 y.o.   MRN: [id number]  HPI 57 y/o BF here for a follow up visit...   ~  December 25, 2008:  she was last seen 2/08 for refill of her Atenolol for palpit... she has been out of work & still no insurance for full eval or CPX... she notes that she has been doing satis overall- no new complaints or concerns... she would like to get the Aten refilled & she uses OTC Prevacid, Calcium, MVI, Vit D...  ~  February 23, 2010:  58mo ROV- CC not resting well & fair energy;  requests refills for 90d supplies...    Hx MVP w/ occas palpit on Aten50 & doing well she notes;  using Pev15 OTC Prn for reflux symptoms;  GI per [facility] & pt encouraged to f/u w/ her egarding routine screening colonoscopy;  she still sees DrStringer for GYN- Pap, Mammogram, etc are all OK per pt hx;  remote hx neck pain & HAs but doing well w/o recurrent problems apparent;  ?prev hx MS? & she denies neuro problems at this time... she is requested to go to the lab for FASTING blood work, but she declined to do these tests for $$ reasons...  ~  April 05, 2011:  42mo ROV & her CC is insomnia w/ difficulty initiating & maintaining sleep> states she averages 4H sleep per night; we reviewed sleep hygiene recommendations & OTC Rx... She is also anxious & we decided to try ALPRAZOLAM 0.5mg  1/2 to 1 tab po Tid for this & her sleep related issue...    She is c/o persistent palpit & has prev been rec to avoid caffeine, nicotine, pseudophed, etc;  ATENOLOL 50mg - 1/2 tab prn helps she says...     We reviewed prob list, meds, xrays and lack of lab data- she refuses due to cost issues> see below for updates >> EKG 4/13 showed SBrady, rate52, WNL, NAD.Marland Kitchen.  ~  April 04, 2012:  Yearly ROV & CPX> [name] reports a good year overall but her CC = "I have a major problem w/ my digestion- I feel swollen when I eat" c/o not digesting her food, bloating, constip, and in need of GI work-up (she  will decide re: DrNat-Mann or [facility] for this & needed colonoscopy) ... We reviewed the following medical problems during today's office visit >>     AR> on OTC antihist prn; she denies cough, sput, hemoptysis, SOB, etc...    MVP> on Aten50-1/2 daily; BP= 130/82 & doing well w/o CP, palpit, SOB, edema, etc; reminded to avoid caffeine...    GI- GERD, IBS, Constip> on Prev15 OTC daily & Probiotic; she had eval DrNat-Mann 2006    Hx gestational DM yrs ago> on diet alone; labs confirm normal BS and no signs of DM...    ?Hx MS> SEE BELOW- she has not had any manifestations since the late 1990s...    Hx HAs> on OTC analgesics as needed; she notes HA from "tension in my neck" & advised heating pad etc...    Anxiety> on Alpraz0.5prn; she tells me that she takes 1/2 tab Qhs & tolerates well... We reviewed prob list, meds, xrays and labs> see below for updates >> she refuses all vaccinations due to perceived reaction yrs ago... CXR 4/14 showed normal heart size, clear lungs, WNL, NAD... LABS 4/14:  Chems- wnl;  CBC- wnl;  TSH=1.27;  VitD=59.Marland KitchenMarland Kitchen  Problem List:    ALLERGIC RHINITIS (ICD-477.9) - she uses OTC antihist Prn...  MITRAL VALVE PROLAPSE, HX OF (ICD-V12.50), & PALPITATIONS (ICD-785.1) >>  ~  on ATENOLOL 50mg - takes 1/2 daily & extra 1/2 prn palpit... prev Holter monitor 2005 showed PAC's/ PVC's... ~  EKG 4/13 showed SBrady, rate52, WNL, NAD...  GERD (ICD-530.81) - prev on Aciphex 20mg /d... eval by DrNat-Mann  in 2006 w/ ?EGD at [facility] (we don't have records)... she had EGD 8/02 by Dorris Singh w/ esophagiis seen... ~  1/10:  she wants cheaper med- try PREVACID 24H OTC daily...  ~  4/14:  CPX c/o indigestion & needs referral to GI for further eval & colonoscopy due...  IRRITABLE BOWEL SYNDROME (ICD-564.1), & CONSTIPATION (ICD-564.00) - eval by DrNat-Man 2006. ~  2/12:  pt is requested to contact DrNat-Mann for needed screening colonoscopy...  Hx of GESTATIONAL DIABETES (ICD-648.80) -  GYN= DrStringer & seen 2009 w/ PAP, Mammogram, & Rx w/ Calcium, MVI, Vit D...  ? of MULTIPLE SCLEROSIS (ICD-340) - she presented w/ dysesthesias in 1995 w/ eval by DrSteifel... MRI said to be compatible w/ MS & hosp for IV Solumedrol at that time... hx of being seen at [facility] in the 1980's w/ LP that was also consistant w/ MS... no problems since then x occas sensory symptoms- DrStiefel released her in 1997...  Hx of HEADACHE (ICD-784.0) - eval by DrAdelman 1993- neg MRI x ectopic Cbll tissue extending below the level of the foramen magnum... Rx'd w/ SOMA & improved...  Hx of NECK PAIN (ICD-723.1) - she developed neck pain 2007 w/ eval DrKramer for Ortho, and saw DrJenkins for Neurosurg- Dx'd cerv strain & MRI showed few disc bulges & signal abnormalities r/o demyelinating process like MS (she didn't tell DrJenkins about her prev hx as above)...  ANXIETY (ICD-300.00) - we prescribed ALPRAZOLAM 0.5mg  1/2 to 1 tab Tid as needed...  Hx of ANEMIA (ICD-285.9) ~  labs 2/08 showed Hg= 14.2  Health Maintenance - takes OMEGA-3 Fish Oil   Past Surgical History  Procedure Laterality Date  . Surgery to remove ovarian cyst    . C section x 1    . Laparoscopy    . Colonoscopy      Outpatient Encounter Prescriptions as of 04/04/2012  Medication Sig Dispense Refill  . ALPRAZolam (XANAX) 0.5 MG tablet Take 1/2 to 1 tablet by mouth at bedtime as needed      . atenolol (TENORMIN) 50 MG tablet Take 1/2 tablet by mouth daily      . Biotin (BIOTIN 5000) 5 MG CAPS Take 1 capsule by mouth daily.      . Calcium Carbonate-Vitamin D (CALTRATE 600+D) 600-400 MG-UNIT per tablet Take 1 tablet by mouth daily.      . fish oil-omega-3 fatty acids 1000 MG capsule Take 2 g by mouth daily.      . lansoprazole (PREVACID) 15 MG capsule Take 15 mg by mouth daily.      . Multiple Vitamins-Minerals (WOMENS MULTIVITAMIN PLUS PO) Take 1 tablet by mouth daily.      . [DISCONTINUED] ALPRAZolam (XANAX) 0.5 MG tablet Take 1/2 to 1  tablet by mouth three times daily as needed  90 tablet  5  . [DISCONTINUED] atenolol (TENORMIN) 50 MG tablet Take 1 tablet (50 mg total) by mouth daily.  90 tablet  3  . Estradiol (VAGIFEM) 10 MCG TABS Place 1 tablet (10 mcg total) vaginally 2 (two) times a week.  8 tablet  11   No  facility-administered encounter medications on file as of 04/04/2012.    Allergies  Allergen Reactions  . Aspirin     Makes her lower back hurt  . Iodine     shaking    Current Medications, Allergies, Past Medical History, Past Surgical History, Family History, and Social History were reviewed in [employer] record.   Review of Systems        See HPI - all other systems neg except as noted...  The patient denies anorexia, fever, weight loss, weight gain, vision loss, decreased hearing, hoarseness, chest pain, syncope, dyspnea on exertion, peripheral edema, prolonged cough, headaches, hemoptysis, abdominal pain, melena, hematochezia, severe indigestion/heartburn, hematuria, incontinence, muscle weakness, suspicious skin lesions, transient blindness, difficulty walking, depression, unusual weight change, abnormal bleeding, enlarged lymph nodes, and angioedema.     Objective:   Physical Exam     WD, WN, 57 y/o BF in NAD... GENERAL:  Alert & oriented; pleasant & cooperative... HEENT:  [place]/AT, EOM-wnl, PERRLA, EACs-clear, TMs-wnl, NOSE-clear, THROAT-clear & wnl. NECK:  Supple w/ full ROM; no JVD; normal carotid impulses w/o bruits; no thyromegaly or nodules palpated; no lymphadenopathy. CHEST:  Clear to P & A; without wheezes/ rales/ or rhonchi. HEART:  Regular Rhythm; without murmurs/ rubs/ or gallops. ABDOMEN:  Soft & nontender; normal bowel sounds; no organomegaly or masses detected. EXT: without deformities or arthritic changes; no varicose veins/ venous insuffic/ or edema. NEURO:  CN's intact; motor testing normal; sensory testing normal; gait normal & balance OK. DERM:  No lesions  noted; no rash etc...  RADIOLOGY DATA:  Reviewed in the EPIC EMR & discussed w/ the patient...  LABORATORY DATA:  Reviewed in the EPIC EMR & discussed w/ the patient...   Assessment:      Hx AR>  OK on OTC meds but reminded to avoid pseudophed etc...  MVP/ Palpit>  Hx Pacs & PVCs in the past; advised to avoid caffeine, nicotine, pseudophed, etc; on Atenolol 50- 1/2 to 1 daily...  GERD>  Prev on Prevacid & it helped; discussed OTC Prilosec/ Prevacid/ H2Blockers etc...  IBS, Constip>  We still do not have records of EGD/ Colon from 2006 DrNat-Mann; needs GI eval for her indigestion complaints & ?f/u colon...  ?MS>  Apparently w/o any neuromusc issues since 1997 & remains asymptomatic...  Hx HAs>  Prev eval by [facility]; prev Rx w/ Soma350 & improved...  Hx Neck Pain>  Eval by DrKramer for Ortho in past, also DrJenkins for NS...  Anxiety>  On ALPRAZOLAM for prn use...    Plan:     Patient's Medications  New Prescriptions   No medications on file  Previous Medications   CALCIUM CARBONATE-VITAMIN D (CALTRATE 600+D) 600-400 MG-UNIT PER TABLET    Take 1 tablet by mouth daily.   CHOLECALCIFEROL (VITAMIN D) 400 UNITS TABS    Take 400 Units by mouth daily.   FISH OIL-OMEGA-3 FATTY ACIDS 1000 MG CAPSULE    Take 2 g by mouth daily.   L-GLUTAMINE 500 MG CAPS    Take 1 capsule by mouth daily.   LANSOPRAZOLE (PREVACID) 15 MG CAPSULE    Take 15 mg by mouth daily.   MULTIPLE VITAMINS-MINERALS (WOMENS MULTIVITAMIN PLUS PO)    Take 1 tablet by mouth daily.   PROBIOTIC PRODUCT (PROBIOTIC DAILY) CAPS    Take 1 capsule by mouth daily.   WHEAT DEXTRIN (BENEFIBER DRINK MIX) PACK    Take 1 Package by mouth 2 (two) times daily.  Modified Medications   Modified  Medication Previous Medication   ALPRAZOLAM (XANAX) 0.5 MG TABLET ALPRAZolam (XANAX) 0.5 MG tablet      Take 1/2 to 1 tablet by mouth at bedtime as needed    Take 1/2 to 1 tablet by mouth three times daily as needed   ATENOLOL (TENORMIN) 50  MG TABLET atenolol (TENORMIN) 50 MG tablet      Take 1/2 tablet by mouth daily    Take 1 tablet (50 mg total) by mouth daily.  Discontinued Medications   ALPRAZOLAM (XANAX) 0.5 MG TABLET    Take 1/2 to 1 tablet by mouth at bedtime as needed   ATENOLOL (TENORMIN) 50 MG TABLET    Take 1/2 tablet by mouth daily   BIOTIN (BIOTIN 5000) 5 MG CAPS    Take 1 capsule by mouth daily.   ESTRADIOL (VAGIFEM) 10 MCG TABS    Place 1 tablet (10 mcg total) vaginally 2 (two) times a week.

## 2012-04-05 LAB — VITAMIN D 25 HYDROXY (VIT D DEFICIENCY, FRACTURES): Vit D, 25-Hydroxy: 59 ng/mL (ref 30–89)

## 2012-04-06 ENCOUNTER — Telehealth: Payer: Self-pay | Admitting: Pulmonary Disease

## 2012-04-06 NOTE — Telephone Encounter (Signed)
I spoke with patient about results and she verbalized understanding and had no questions 

## 2012-04-06 NOTE — Telephone Encounter (Signed)
Notes Recorded by Michele Mcalpine, MD on 04/06/2012 at 8:55 AM Please notify patient> Continue same meds and supplements... Chems, CBC, Thyroid, VitD> ALL WNL...  Notes Recorded by Michele Mcalpine, MD on 04/06/2012 at 8:56 AM Please notify patient>  CXR is clear, NAD...      [id number]. Carron Curie, CMA

## 2012-04-06 NOTE — Telephone Encounter (Signed)
Returned call. Hazel Sams

## 2012-05-03 ENCOUNTER — Encounter: Payer: Self-pay | Admitting: Gastroenterology

## 2012-05-03 ENCOUNTER — Ambulatory Visit (INDEPENDENT_AMBULATORY_CARE_PROVIDER_SITE_OTHER): Payer: PRIVATE HEALTH INSURANCE | Admitting: Gastroenterology

## 2012-05-03 VITALS — BP 110/70 | HR 80 | Ht 62.5 in | Wt 156.4 lb

## 2012-05-03 DIAGNOSIS — K5900 Constipation, unspecified: Secondary | ICD-10-CM

## 2012-05-03 DIAGNOSIS — K3189 Other diseases of stomach and duodenum: Secondary | ICD-10-CM | POA: Insufficient documentation

## 2012-05-03 DIAGNOSIS — R1013 Epigastric pain: Secondary | ICD-10-CM | POA: Insufficient documentation

## 2012-05-03 NOTE — Patient Instructions (Addendum)
Will will obtain your colonoscopy records from Dr Loreta Ave from 2006

## 2012-05-03 NOTE — Assessment & Plan Note (Signed)
Dyspeptic symptoms are possibly related to GERD. Constipation may be contributing.  Recommendations #1 increase Prevacid to 30 mg daily #2 therapy for constipation

## 2012-05-03 NOTE — Assessment & Plan Note (Signed)
Patient's constipation is likely functional.  Recommendations #1 she will consider enrollment in a chronic idiopathic constipation trial

## 2012-05-03 NOTE — Progress Notes (Signed)
History of Present Illness: Pleasant 57 year old Afro-American female referred at the request of Dr. Kriste Basque for evaluation of constipation. This has been a chronic problem for several years. She has the sense of incomplete evacuation. She complains of postprandial upper abdominal fullness. She denies nausea, vomiting or pyrosis. She takes Prevacid 15 mg a day.  She underwent colonoscopy and upper endoscopy in 2006 although results are not known.  With straining she has had protrusion of hemorrhoids but she denies pain, itching or bleeding.    Past Medical History  Diagnosis Date  . Allergic rhinitis   . Mitral valve prolapse   . Palpitations   . GERD (gastroesophageal reflux disease)   . IBS (irritable bowel syndrome)   . Constipation   . Gestational diabetes   . Multiple sclerosis   . Headache   . Headache   . Neck pain   . Anxiety   . Anemia    Past Surgical History  Procedure Laterality Date  . Surgery to remove ovarian cyst    . C section x 1    . Laparoscopy    . Colonoscopy     family history includes Breast cancer in her mother and sisters; Diabetes in her brother and sisters; and Heart disease in her father. Current Outpatient Prescriptions  Medication Sig Dispense Refill  . ALPRAZolam (XANAX) 0.5 MG tablet Take 1/2 to 1 tablet by mouth at bedtime as needed  30 tablet  5  . atenolol (TENORMIN) 50 MG tablet Take 1/2 tablet by mouth daily  45 tablet  3  . Calcium Carbonate-Vitamin D (CALTRATE 600+D) 600-400 MG-UNIT per tablet Take 1 tablet by mouth daily.      . cholecalciferol (VITAMIN D) 400 UNITS TABS Take 400 Units by mouth daily.      . fish oil-omega-3 fatty acids 1000 MG capsule Take 2 g by mouth daily.      . L-Glutamine 500 MG CAPS Take 1 capsule by mouth daily.      . lansoprazole (PREVACID) 15 MG capsule Take 15 mg by mouth daily.      . Multiple Vitamins-Minerals (WOMENS MULTIVITAMIN PLUS PO) Take 1 tablet by mouth daily.      . Probiotic Product (PROBIOTIC  DAILY) CAPS Take 1 capsule by mouth daily.      . Wheat Dextrin (BENEFIBER DRINK MIX) PACK Take 1 Package by mouth 2 (two) times daily.       No current facility-administered medications for this visit.   Allergies as of 05/03/2012 - Review Complete 05/03/2012  Allergen Reaction Noted  . Aspirin  03/24/2007  . Iodine  04/05/2011    reports that she has never smoked. She has never used smokeless tobacco. She reports that she does not drink alcohol or use illicit drugs.     Review of Systems: Pertinent positive and negative review of systems were noted in the above HPI section. All other review of systems were otherwise negative.  Vital signs were reviewed in today's medical record Physical Exam: General: Well developed , well nourished, no acute distress Skin: anicteric Head: Normocephalic and atraumatic Eyes:  sclerae anicteric, EOMI Ears: Normal auditory acuity Mouth: No deformity or lesions Neck: Supple, no masses or thyromegaly Lungs: Clear throughout to auscultation Heart: Regular rate and rhythm; no murmurs, rubs or bruits Abdomen: Soft, non tender and non distended. No masses, hepatosplenomegaly or hernias noted. Normal Bowel sounds Rectal:deferred Musculoskeletal: Symmetrical with no gross deformities  Skin: No lesions on visible extremities Pulses:  Normal pulses noted  Extremities: No clubbing, cyanosis, edema or deformities noted Neurological: Alert oriented x 4, grossly nonfocal Cervical Nodes:  No significant cervical adenopathy Inguinal Nodes: No significant inguinal adenopathy Psychological:  Alert and cooperative. Normal mood and affect

## 2012-06-19 ENCOUNTER — Ambulatory Visit: Payer: Self-pay

## 2012-06-21 ENCOUNTER — Other Ambulatory Visit: Payer: Self-pay | Admitting: Pulmonary Disease

## 2012-08-09 ENCOUNTER — Encounter: Payer: Self-pay | Admitting: Gynecology

## 2012-08-09 ENCOUNTER — Ambulatory Visit (INDEPENDENT_AMBULATORY_CARE_PROVIDER_SITE_OTHER): Payer: No Typology Code available for payment source | Admitting: Gynecology

## 2012-08-09 ENCOUNTER — Other Ambulatory Visit (HOSPITAL_COMMUNITY)
Admission: RE | Admit: 2012-08-09 | Discharge: 2012-08-09 | Disposition: A | Discharge status: Home / Self Care | Payer: No Typology Code available for payment source | Source: Ambulatory Visit | Attending: Gynecology | Admitting: Gynecology

## 2012-08-09 VITALS — BP 120/76 | Ht 60.25 in | Wt 147.0 lb

## 2012-08-09 DIAGNOSIS — Z01419 Encounter for gynecological examination (general) (routine) without abnormal findings: Secondary | ICD-10-CM

## 2012-08-09 DIAGNOSIS — Z1151 Encounter for screening for human papillomavirus (HPV): Secondary | ICD-10-CM | POA: Insufficient documentation

## 2012-08-09 DIAGNOSIS — N951 Menopausal and female climacteric states: Secondary | ICD-10-CM

## 2012-08-09 DIAGNOSIS — N952 Postmenopausal atrophic vaginitis: Secondary | ICD-10-CM | POA: Insufficient documentation

## 2012-08-09 DIAGNOSIS — Z803 Family history of malignant neoplasm of breast: Secondary | ICD-10-CM

## 2012-08-09 MED ORDER — NONFORMULARY OR COMPOUNDED ITEM: Status: DC

## 2012-08-09 NOTE — Patient Instructions (Addendum)
BRCA-1 and BRCA-2 BRCA-1 and BRCA-2 are 2 genes that are linked with hereditary breast and ovarian cancers. About 200,000 women are diagnosed with invasive breast cancer each year and about 23,000 with ovarian cancer (according to the American Cancer Society). Of these cancers, about 5% to 10% will be due to a mutation in one of the BRCA genes. Men can also inherit an increased risk of developing breast cancer, primarily from an alteration in the BRCA-2 gene.  Individuals with mutations in BRCA1 or BRCA2 have significantly elevated risks for breast cancer (up to 80% lifetime risk), ovarian cancer (up to 40% lifetime risk), bilateral breast cancer and other types of cancers. BRCA mutations are inherited and passed from generation to generation. One half of the time, they are passed from the father's side of the family.  The DNA in white blood cells is used to detect mutations in the BRCA genes. While the gene products (proteins) of the BRCA genes act only in breast and ovarian tissue, the genes are present in every cell of the body and blood is the most easily accessible source of that DNA. PREPARATION FOR TEST The test for BRCA mutations is done on a blood sample collected by needle from a vein in the arm. The test does not require surgical biopsy of breast or ovarian tissue.  NORMAL FINDINGS No genetic mutations. Ranges for normal findings may vary among different laboratories and hospitals. You should always check with your doctor after having lab work or other tests done to discuss the meaning of your test results and whether your values are considered within normal limits. MEANING OF TEST  Your caregiver will go over the test results with you and discuss the importance and meaning of your results, as well as treatment options and the need for additional tests if necessary. OBTAINING THE TEST RESULTS It is your responsibility to obtain your test results. Ask the lab or department performing the test  when and how you will get your results. OTHER THINGS TO KNOW Your test results may have implications for other family members. When one member of a family is tested for BRCA mutations, issues often arise about how or whether to share this information with other family members. Seek advice from a genetic counselor about communication of result with your family members.  Pre and post test consultation with a health care provider knowledgeable about genetic testing cannot be overemphasized.  There are many issues to be considered when preparing for a genetic test and upon learning the results, and a genetic counselor has the knowledge and experience to help you sort through them.  If the BRCA test is positive, the options include increased frequency of check-ups (e.g., mammography, blood tests for CA-125, or transvaginal ultrasonography); medications that could reduce risk (e.g., oral contraceptives or tamoxifen); or surgical removal of the ovaries or breasts. There are a number of variables involved and it is important to discuss your options with your doctor and genetic counselor. Research studies have reported that for every 1000 women negative for BRCA mutations, between 12 and 45 of them will develop breast cancer by age 50 and between 3 and 4 will develop ovarian cancer by age 50. The risk increases with age. The test can be ordered by a doctor, preferably by one who can also offer genetic counseling. The blood sample will be sent to a laboratory that specializes in BRCA testing. The American Society of Clinical Oncology and the National Breast Cancer Coalition encourage women seeking the   test to participate in long-term outcome studies to help gather information on the effectiveness of different check-up and treatment options. Document Released: 01/15/2004 Document Revised: 03/15/2011 Document Reviewed: 11/27/2007 [facility] Patient Information 2014 [place], [place].  Shingles Vaccine What You Need to  Know WHAT IS SHINGLES?  Shingles is a painful skin rash, often with blisters. It is also called Herpes Zoster or just Zoster.  A shingles rash usually appears on one side of the face or body and lasts from 2 to 4 weeks. Its main symptom is pain, which can be quite severe. Other symptoms of shingles can include fever, headache, chills, and upset stomach. Very rarely, a shingles infection can lead to pneumonia, hearing problems, blindness, brain inflammation (encephalitis), or death.  For about 1 person in 5, severe pain can continue even after the rash clears up. This is called post-herpetic neuralgia.  Shingles is caused by the Varicella Zoster virus. This is the same virus that causes chickenpox. Only someone who has had a case of chickenpox or rarely, has gotten chickenpox vaccine, can get shingles. The virus stays in your body. It can reappear many years later to cause a case of shingles.  You cannot catch shingles from another person with shingles. However, a person who has never had chickenpox (or chickenpox vaccine) could get chickenpox from someone with shingles. This is not very common.  Shingles is far more common in people 59 and older than in younger people. It is also more common in people whose immune systems are weakened because of a disease such as cancer or drugs such as steroids or chemotherapy.  At least 1 million people get shingles per year in the [place]. SHINGLES VACCINE  A vaccine for shingles was licensed in 2006. In clinical trials, the vaccine reduced the risk of shingles by 50%. It can also reduce the pain in people who still get shingles after being vaccinated.  A single dose of shingles vaccine is recommended for adults 58 years of age and older. SOME PEOPLE SHOULD NOT GET SHINGLES VACCINE OR SHOULD WAIT A person should not get shingles vaccine if he or she:  Has ever had a life-threatening allergic reaction to gelatin, the antibiotic neomycin, or any other  component of shingles vaccine. Tell your caregiver if you have any severe allergies.  Has a weakened immune system because of current:  AIDS or another disease that affects the immune system.  Treatment with drugs that affect the immune system, such as prolonged use of high-dose steroids.  Cancer treatment, such as radiation or chemotherapy.  Cancer affecting the bone marrow or lymphatic system, such as leukemia or lymphoma.  Is pregnant, or might be pregnant. Women should not become pregnant until at least 4 weeks after getting shingles vaccine. Someone with a minor illness, such as a cold, may be vaccinated. Anyone with a moderate or severe acute illness should usually wait until he or she recovers before getting the vaccine. This includes anyone with a temperature of 101.3 F (38 C) or higher. WHAT ARE THE RISKS FROM SHINGLES VACCINE?  A vaccine, like any medicine, could possibly cause serious problems, such as severe allergic reactions. However, the risk of a vaccine causing serious harm, or death, is extremely small.  No serious problems have been identified with shingles vaccine. Mild Problems  Redness, soreness, swelling, or itching at the site of the injection (about 1 person in 3).  Headache (about 1 person in 70). Like all vaccines, shingles vaccine is being closely monitored  for unusual or severe problems. WHAT IF THERE IS A MODERATE OR SEVERE REACTION? What should I look for? Any unusual condition, such as a severe allergic reaction or a high fever. If a severe allergic reaction occurred, it would be within a few minutes to an hour after the shot. Signs of a serious allergic reaction can include difficulty breathing, weakness, hoarseness or wheezing, a fast heartbeat, hives, dizziness, paleness, or swelling of the throat. What should I do?  Call your caregiver, or get the person to a caregiver right away.  Tell the caregiver what happened, the date and time it happened,  and when the vaccination was given.  Ask the caregiver to report the reaction by filing a Vaccine Adverse Event Reporting System (VAERS) form. Or, you can file this report through the VAERS web site at www.vaers.LAgents.no or by calling 1-(513)781-3717. VAERS does not provide medical advice. HOW CAN I LEARN MORE?  Ask your caregiver. He or she can give you the vaccine package insert or suggest other sources of information.  Contact the Centers for Disease Control and Prevention (CDC):  Call (520)269-7266 (1-800-CDC-INFO).  Visit the CDC website at PicCapture.uy CDC Shingles Vaccine VIS (10/10/07) Document Released: 10/18/2005 Document Revised: 03/15/2011 Document Reviewed: 10/10/2007 [employer] Patient Information 2014 [place], [place].  Bone Densitometry Bone densitometry is a special X-ray that measures your bone density and can be used to help predict your risk of bone fractures. This test is used to determine bone mineral content and density to diagnose osteoporosis. Osteoporosis is the loss of bone that may cause the bone to become weak. Osteoporosis commonly occurs in women entering menopause. However, it may be found in men and in people with other diseases. PREPARATION FOR TEST No preparation necessary. WHO SHOULD BE TESTED?  All women older than 41.  Postmenopausal women (50 to 54) with risk factors for osteoporosis.  People with a previous fracture caused by normal activities.  People with a small body frame (less than 127 poundsor a body mass index [BMI] of less than 21).  People who have a parent with a hip fracture or history of osteoporosis.  People who smoke.  People who have rheumatoid arthritis.  Anyone who engages in excessive alcohol use (more than 3 drinks most days).  Women who experience early menopause. WHEN SHOULD YOU BE RETESTED? Current guidelines suggest that you should wait at least 2 years before doing a bone density test again if your first test  was normal.Recent studies indicated that women with normal bone density may be able to wait a few years before needing to repeat a bone density test. You should discuss this with your caregiver.  NORMAL FINDINGS   Normal: less than standard deviation below normal (greater than -1).  Osteopenia: 1 to 2.5 standard deviations below normal (-1 to -2.5).  Osteoporosis: greater than 2.5 standard deviations below normal (less than -2.5). Test results are reported as a "T score" and a "Z score."The T score is a number that compares your bone density with the bone density of healthy, young women.The Z score is a number that compares your bone density with the scores of women who are the same age, gender, and race.  Ranges for normal findings may vary among different laboratories and hospitals. You should always check with your doctor after having lab work or other tests done to discuss the meaning of your test results and whether your values are considered within normal limits. MEANING OF TEST  Your caregiver will go over  the test results with you and discuss the importance and meaning of your results, as well as treatment options and the need for additional tests if necessary. OBTAINING THE TEST RESULTS It is your responsibility to obtain your test results. Ask the lab or department performing the test when and how you will get your results. Document Released: 01/13/2004 Document Revised: 03/15/2011 Document Reviewed: 02/04/2010 [facility] Patient Information 2014 [place], [place].

## 2012-08-09 NOTE — Progress Notes (Signed)
Janice Gross 1955/08/29 [id number]   History:    57 y.o.  for annual gyn exam who is new to the practice. Patient's been complaining of vaginal dryness and constipation. Patient was previously been followed by another provider here in [place]. Patient states she had a normal Pap smear in 2013. Patient denies any prior history of abnormal Pap smears. Patient with strong family history of breast cancer as follows:  Mother history of breast cancer 3 sisters with breast cancer 2 nieces with breast cancer  Patient has not received genetic counseling for BRCA1 and BRCA2 testing yet. Patient had a colonoscopy 7 years ago patient at times feels bloated. She suffers a tonsillar dyspareunia because of her vaginal dryness. Patient several years ago had been on HRT she discontinued the vaginal estrogen because of fear of breast cancer.  Past medical history,surgical history, family history and social history were all reviewed and documented in the EPIC chart.  Gynecologic History No LMP recorded. Patient is postmenopausal. Contraception: post menopausal status Last Pap: 2013. Results were: normal Last mammogram: 2014. Results were: we do not have records the patient stated that it was normal  Obstetric History OB History   Grav Para Term Preterm Abortions TAB SAB Ect Mult Living   1 1        1      # Outc Date GA Lbr Len/2nd Wgt Sex Del Anes PTL Lv   1 PAR                ROS: A ROS was performed and pertinent positives and negatives are included in the history.  GENERAL: No fevers or chills. HEENT: No change in vision, no earache, sore throat or sinus congestion. NECK: No pain or stiffness. CARDIOVASCULAR: No chest pain or pressure. No palpitations. PULMONARY: No shortness of breath, cough or wheeze. GASTROINTESTINAL: No abdominal pain, nausea, vomiting or diarrhea, melena or bright red blood per rectum. GENITOURINARY: No urinary frequency, urgency, hesitancy or dysuria. MUSCULOSKELETAL: No  joint or muscle pain, no back pain, no recent trauma. DERMATOLOGIC: No rash, no itching, no lesions. ENDOCRINE: No polyuria, polydipsia, no heat or cold intolerance. No recent change in weight. HEMATOLOGICAL: No anemia or easy bruising or bleeding. NEUROLOGIC: No headache, seizures, numbness, tingling or weakness. PSYCHIATRIC: No depression, no loss of interest in normal activity or change in sleep pattern.   vaginal dryness and irritation and dyspareunia  Exam: chaperone present  BP 120/76  Ht 5' 0.25" (1.53 m)  Wt 147 lb (66.679 kg)  BMI 28.48 kg/m2  Body mass index is 28.48 kg/(m^2).  General appearance : Well developed well nourished female. No acute distress HEENT: Neck supple, trachea midline, no carotid bruits, no thyroidmegaly Lungs: Clear to auscultation, no rhonchi or wheezes, or rib retractions  Heart: Regular rate and rhythm, no murmurs or gallops Breast:Examined in sitting and supine position were symmetrical in appearance, no palpable masses or tenderness,  no skin retraction, no nipple inversion, no nipple discharge, no skin discoloration, no axillary or supraclavicular lymphadenopathy Abdomen: no palpable masses or tenderness, no rebound or guarding Extremities: no edema or skin discoloration or tenderness  Pelvic:  Bartholin, Urethra, Skene Glands: Within normal limits             Vagina: No gross lesions or discharge,vaginal atrophy  Cervix: No gross lesions or discharge  Uterus  anteverted, normal size, shape and consistency, non-tender and mobile  Adnexa  Without masses or tenderness  Anus and perineum  normal   Rectovaginal  normal sphincter tone without palpated masses or tenderness             Hemoccult cards provided     Assessment/Plan:  57 y.o. female for annual exam with history of constipation. I've recommended that she begin taking MiraLax 1 tablespoon with juice daily. I've recommended she followup with again her gastroenterologist for further evaluation  of of bloating sensation after meals. We did do a Pap smear today. We discussed the new Pap smear screening guidelines. Patient will be referred to the [facility] for genetic counseling and testing for BRCA1 and BRCA2 since she has strong family history of breast cancer. She was reminded cement in the office in [place] for testing. Her primary physician Dr. Kriste Basque is her PCP and has been drawn her lab work. We had a lengthy discussion on hormone replacement therapy as well as the women's health initiative study. We discussed the risks benefits and pros and cons of oral versus vaginal versus transdermal applications. She would like to go on a low-dose transvaginal estrogen we will place her on estradiol 0.02% to apply intravaginally twice a week. I did give her literature and information on Osphena. Patient will schedule her bone density study here in our office in the next 2 weeks.    Ok Edwards MD, 5:43 PM 08/09/2012

## 2012-08-16 ENCOUNTER — Telehealth: Payer: Self-pay | Admitting: *Deleted

## 2012-08-16 NOTE — Telephone Encounter (Signed)
Patient has only been on medication for one week. She may want to apply it once a week for the first couple weeks then go twice a week thereafter. The other option would be to buy a probiotic gel over-the-counter called Luvena or Rehresh both are nonhormonal. There is a new product in the market call Osphena that is an oral tablet taken to help with vaginal dryness and dyspareunia. I would need to see her to counsel her and go over the medication give her samples if she wants to try this alternative.

## 2012-08-16 NOTE — Telephone Encounter (Signed)
Pt said she will try Rx once a week, then twice a week thereafter. And call to follow up.

## 2012-08-16 NOTE — Telephone Encounter (Signed)
Pt was given Rx for estradiol 0.02% to apply intravaginally twice a week, 08/09/12 pt said she noticed some cramping and tenderness. Pt asked if another rx could be given? Please advise

## 2012-08-25 ENCOUNTER — Other Ambulatory Visit: Payer: Self-pay | Admitting: Pulmonary Disease

## 2012-10-12 ENCOUNTER — Other Ambulatory Visit: Payer: Self-pay | Admitting: Pulmonary Disease

## 2012-11-11 ENCOUNTER — Encounter (HOSPITAL_COMMUNITY): Payer: Self-pay | Admitting: Emergency Medicine

## 2012-11-11 ENCOUNTER — Emergency Department (HOSPITAL_COMMUNITY)
Admission: EM | Admit: 2012-11-11 | Discharge: 2012-11-11 | Disposition: A | Discharge status: Home / Self Care | Payer: Commercial Managed Care - PPO | Attending: Emergency Medicine | Admitting: Emergency Medicine

## 2012-11-11 DIAGNOSIS — K5900 Constipation, unspecified: Secondary | ICD-10-CM | POA: Insufficient documentation

## 2012-11-11 DIAGNOSIS — S0993XA Unspecified injury of face, initial encounter: Secondary | ICD-10-CM | POA: Diagnosis present

## 2012-11-11 DIAGNOSIS — S4980XA Other specified injuries of shoulder and upper arm, unspecified arm, initial encounter: Secondary | ICD-10-CM | POA: Diagnosis not present

## 2012-11-11 DIAGNOSIS — F411 Generalized anxiety disorder: Secondary | ICD-10-CM | POA: Insufficient documentation

## 2012-11-11 DIAGNOSIS — Z79899 Other long term (current) drug therapy: Secondary | ICD-10-CM | POA: Diagnosis not present

## 2012-11-11 DIAGNOSIS — Z862 Personal history of diseases of the blood and blood-forming organs and certain disorders involving the immune mechanism: Secondary | ICD-10-CM | POA: Insufficient documentation

## 2012-11-11 DIAGNOSIS — Z8632 Personal history of gestational diabetes: Secondary | ICD-10-CM | POA: Insufficient documentation

## 2012-11-11 DIAGNOSIS — Y92410 Unspecified street and highway as the place of occurrence of the external cause: Secondary | ICD-10-CM | POA: Diagnosis not present

## 2012-11-11 DIAGNOSIS — IMO0002 Reserved for concepts with insufficient information to code with codable children: Secondary | ICD-10-CM | POA: Diagnosis not present

## 2012-11-11 DIAGNOSIS — K219 Gastro-esophageal reflux disease without esophagitis: Secondary | ICD-10-CM | POA: Insufficient documentation

## 2012-11-11 DIAGNOSIS — S46909A Unspecified injury of unspecified muscle, fascia and tendon at shoulder and upper arm level, unspecified arm, initial encounter: Secondary | ICD-10-CM | POA: Insufficient documentation

## 2012-11-11 DIAGNOSIS — Y9389 Activity, other specified: Secondary | ICD-10-CM | POA: Diagnosis not present

## 2012-11-11 MED ORDER — DIAZEPAM 5 MG PO TABS: 5.0000 mg | ORAL_TABLET | Freq: Two times a day (BID) | ORAL | Status: DC

## 2012-11-11 MED ORDER — KETOROLAC TROMETHAMINE 60 MG/2ML IM SOLN
60.0000 mg | Freq: Once | INTRAMUSCULAR | Status: AC
  Administered 2012-11-11: 60 mg via INTRAMUSCULAR
  Filled 2012-11-11: qty 2

## 2012-11-11 MED ORDER — OXYCODONE-ACETAMINOPHEN 5-325 MG PO TABS: 2.0000 | ORAL_TABLET | ORAL | Status: DC | PRN
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
## 2012-11-11 NOTE — ED Provider Notes (Signed)
Medical screening examination/treatment/procedure(s) were performed by non-physician practitioner and as supervising physician I was immediately available for consultation/collaboration.  Keylen Uzelac L Evetta Renner, MD 11/11/12 1625 
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
## 2013-03-21 ENCOUNTER — Other Ambulatory Visit: Payer: Self-pay | Admitting: Pulmonary Disease

## 2013-03-22 NOTE — Telephone Encounter (Signed)
Called home # line rang several times and no VM Called mobile and LMTCB x1 Need to be made aware SN retiring from [facility] 04/04/13 Need to set up with new PCP.

## 2013-03-22 NOTE — Telephone Encounter (Signed)
lmtcb x2 

## 2013-03-22 NOTE — Telephone Encounter (Signed)
Pt returning call.Janice Gross ° °

## 2013-03-23 ENCOUNTER — Telehealth: Payer: Self-pay | Admitting: Pulmonary Disease

## 2013-03-23 NOTE — Telephone Encounter (Signed)
Inge Rise, CMA at 03/22/2013 11:01 AM     Status: Signed        Called home # line rang several times and no VM  Called mobile and LMTCB x1  Need to be made aware SN retiring from [facility] 04/04/13  Need to set up with new PCP  ---  Called and made pt aware. Nothing further needed. She was giving phone # to call

## 2013-03-28 ENCOUNTER — Telehealth: Payer: Self-pay | Admitting: Pulmonary Disease

## 2013-03-28 MED ORDER — ALPRAZOLAM 0.5 MG PO TABS: ORAL_TABLET | ORAL | Status: DC

## 2013-03-28 NOTE — Telephone Encounter (Signed)
Called and lmomtcb for the pt to make her aware of SN retiring from Primary care and changing over to pulmonary only.   rx for the xanax has been called to the pts pharmacy.  Will need to help her set up with new primary care.

## 2013-03-28 NOTE — Telephone Encounter (Signed)
lmomtcb x1 

## 2013-03-28 NOTE — Telephone Encounter (Signed)
Pt has returned Leigh's call.

## 2013-03-29 NOTE — Telephone Encounter (Signed)
LMTCB x2  

## 2013-03-30 NOTE — Telephone Encounter (Signed)
ATC, LMTCB

## 2013-04-02 NOTE — Telephone Encounter (Signed)
I spoke with the pt and advised of SN retiring. # given to the pt for [employer]. [place] Bing, CMA

## 2013-04-16 ENCOUNTER — Encounter: Payer: Self-pay | Admitting: Family Medicine

## 2013-04-16 ENCOUNTER — Ambulatory Visit (INDEPENDENT_AMBULATORY_CARE_PROVIDER_SITE_OTHER): Payer: Commercial Managed Care - PPO | Admitting: Family Medicine

## 2013-04-16 VITALS — BP 110/80 | Temp 98.5°F | Ht 60.5 in | Wt 142.0 lb

## 2013-04-16 DIAGNOSIS — G4700 Insomnia, unspecified: Secondary | ICD-10-CM

## 2013-04-16 DIAGNOSIS — K5900 Constipation, unspecified: Secondary | ICD-10-CM

## 2013-04-16 DIAGNOSIS — F411 Generalized anxiety disorder: Secondary | ICD-10-CM

## 2013-04-16 DIAGNOSIS — K219 Gastro-esophageal reflux disease without esophagitis: Secondary | ICD-10-CM

## 2013-04-16 DIAGNOSIS — Z8679 Personal history of other diseases of the circulatory system: Secondary | ICD-10-CM

## 2013-04-16 DIAGNOSIS — R002 Palpitations: Secondary | ICD-10-CM

## 2013-04-16 DIAGNOSIS — K589 Irritable bowel syndrome without diarrhea: Secondary | ICD-10-CM

## 2013-04-16 MED ORDER — ALPRAZOLAM 0.25 MG PO TABS: ORAL_TABLET | ORAL | Status: DC

## 2013-04-16 NOTE — Progress Notes (Signed)
Chief Complaint  Patient presents with  . Establish Care    HPI:  Janice Gross is here to establish care.  Last PCP and physical: Sees Dr. Toney Rakes for gyn gyn physicals and UTD per her report. Reports usually got her labs there.  IBS/GERD: -followed by GI, chronic -was supposed to do study but did not have time -no change -chronic constipation, bloating and poor digestion  Needs refills  Insomnia: -had trouble initiating sleep usually taking 1-2 hours to fall asleep and then got anxious and then woke up not feeling rested -no snoring or apneic spells  Mitral Valve Prolapse/Palpitations: -takes atenolol -no SOB, CP, swelling  Large breasts: -dents in shoulder from bra, shoulder pain from breast weight -difficult to exercise -wants referral to consider breast reduction  HM: -is going to think about vaccines - had reaction in the past, but told not a reaction  Has the following chronic problems and concerns today:  Patient Active Problem List   Diagnosis Date Noted  . Family history of breast cancer in first degree relative 08/09/2012  . Vaginal atrophy 08/09/2012  . Menopause syndrome 08/09/2012  . Dyspepsia and other specified disorders of function of stomach 05/03/2012  . History of neurological disorder 04/04/2012  . Overweight 07/05/2011  . Atrophic vaginitis 07/05/2011  . Family history of breast cancer in mother 07/05/2011  . Recurrent yeast infection 07/02/2011  . ANEMIA 12/25/2008  . CONSTIPATION 12/25/2008  . NECK PAIN 12/25/2008  . HEADACHE 12/25/2008  . PALPITATIONS 12/25/2008  . ANXIETY 03/24/2007  . ALLERGIC RHINITIS 03/24/2007  . GERD 03/24/2007  . IRRITABLE BOWEL SYNDROME 03/24/2007  . GESTATIONAL DIABETES 03/24/2007  . MITRAL VALVE PROLAPSE, HX OF 03/24/2007    Health Maintenance:  ROS: See pertinent positives and negatives per HPI.  Past Medical History  Diagnosis Date  . Allergic rhinitis   . Mitral valve prolapse   .  Palpitations   . GERD (gastroesophageal reflux disease)   . IBS (irritable bowel syndrome)   . Constipation   . Headache(784.0)   . Headache(784.0)   . Neck pain   . Anxiety   . Anemia   . Gestational diabetes     Family History  Problem Relation Age of Onset  . Breast cancer Sister   . Diabetes Sister   . Breast cancer Sister   . Diabetes Sister   . Breast cancer Mother   . Breast cancer Sister   . Heart disease Father   . Diabetes Brother     History   Social History  . Marital Status: Divorced    Spouse Name: N/A    Number of Children: 1  . Years of Education: N/A   Occupational History  . CONTRACTOR    Social History Main Topics  . Smoking status: Never Smoker   . Smokeless tobacco: Never Used     Comment: tried as a teenager  . Alcohol Use: No  . Drug Use: No  . Sexual Activity: Yes    Birth Control/ Protection: None   Other Topics Concern  . None   Social History Narrative   Work or School: Scientist, physiological Situation: lives alone      Spiritual Beliefs: Christian      Lifestyle:walking on a regular basis; diet is fair             Current outpatient prescriptions:ALPRAZolam (XANAX) 0.25 MG tablet, 0.25mg  nightly then wean per instructions provided, Disp: 60 tablet, Rfl: 0;  atenolol (TENORMIN) 50 MG tablet, Take 25 mg by mouth daily., Disp: , Rfl: ;  Calcium Carbonate-Vitamin D (CALTRATE 600+D) 600-400 MG-UNIT per tablet, Take 1 tablet by mouth daily., Disp: , Rfl: ;  cholecalciferol (VITAMIN D) 400 UNITS TABS, Take 400 Units by mouth daily., Disp: , Rfl:  fish oil-omega-3 fatty acids 1000 MG capsule, Take 2 g by mouth daily., Disp: , Rfl: ;  NONFORMULARY OR COMPOUNDED ITEM, Place 1 application vaginally 2 (two) times a week. Estradiol 0.02 % 1 mL prefilled applicator., Disp: , Rfl: ;  polyethylene glycol (MIRALAX / GLYCOLAX) packet, Take 17 g by mouth daily., Disp: , Rfl: ;  Probiotic Product (PROBIOTIC DAILY) CAPS, Take 1 capsule by  mouth daily., Disp: , Rfl:   EXAM:  Filed Vitals:   04/16/13 1609  BP: 110/80  Temp: 98.5 F (36.9 C)    Body mass index is 27.27 kg/(m^2).  GENERAL: vitals reviewed and listed above, alert, oriented, appears well hydrated and in no acute distress  HEENT: atraumatic, conjunttiva clear, no obvious abnormalities on inspection of external nose and ears  NECK: no obvious masses on inspection  LUNGS: clear to auscultation bilaterally, no wheezes, rales or rhonchi, good air movement  CV: HRRR, no peripheral edema  MS: moves all extremities without noticeable abnormality  PSYCH: pleasant and cooperative, no obvious depression or anxiety  ASSESSMENT AND PLAN:  Discussed the following assessment and plan:  Insomnia - Plan: ALPRAZolam (XANAX) 0.25 MG tablet  GERD  CONSTIPATION  ANXIETY  Irritable bowel syndrome  Palpitations  MITRAL VALVE PROLAPSE, HX OF  -We reviewed the PMH, PSH, FH, SH, Meds and Allergies. -We provided refills for any medications we will prescribe as needed. -We addressed current concerns per orders and patient instructions. -We have asked for records for pertinent exams, studies, vaccines and notes from previous providers. -We have advised patient to follow up per instructions below. -follow up for physical  -Patient advised to return or notify a doctor immediately if symptoms worsen or persist or new concerns arise.  Patient Instructions  Insomnia: -cool and dark bedroom and reserve only for sleep and... -wake up at the same time every day; try to have a set bedtime as well -try to sleep for about 15 minutes, if not successful get up and do a quiet activity, journal, etc then go back to bed; repeat as many time as needed - DO NOT WORRY THAT YOU ARE NOT SLEEPING -wean off the xanax slowly - 0.25mg  for a few weeks nightly, then half tab night for a few weeks, then every othe rnight for a few weeks, then a few times per week for a few weeks, then  stop -we advise counesling as well -if it seems you will need the xanax chornically would advise seeing psychiatrist  -We have ordered labs or studies at this visit. It can take up to 1-2 weeks for results and processing. We will contact you with instructions IF your results are abnormal. Normal results will be released to your [facility]. If you have not heard from [place] or can not find your results in [facility] in 2 weeks please contact our office.  -PLEASE SIGN UP FOR MYCHART TODAY   We recommend the following healthy lifestyle measures: - eat a healthy diet consisting of lots of vegetables, fruits, beans, nuts, seeds, healthy meats such as white chicken and fish and whole grains.  - avoid fried foods, fast food, processed foods, sodas, red meet and other fattening foods.  - get  a least 150 minutes of aerobic exercise per week.   Follow up in: CPE in next few months        Lucretia Kern

## 2013-04-16 NOTE — Patient Instructions (Addendum)
Insomnia: -cool and dark bedroom and reserve only for sleep and... -wake up at the same time every day; try to have a set bedtime as well -try to sleep for about 15 minutes, if not successful get up and do a quiet activity, journal, etc then go back to bed; repeat as many time as needed - DO NOT WORRY THAT YOU ARE NOT SLEEPING -wean off the xanax slowly - 0.25mg  for a few weeks nightly, then half tab night for a few weeks, then every othe rnight for a few weeks, then a few times per week for a few weeks, then stop -we advise counesling as well -if it seems you will need the xanax chornically would advise seeing psychiatrist  -We have ordered labs or studies at this visit. It can take up to 1-2 weeks for results and processing. We will contact you with instructions IF your results are abnormal. Normal results will be released to your [facility]. If you have not heard from [place] or can not find your results in [facility] in 2 weeks please contact our office.  -PLEASE SIGN UP FOR MYCHART TODAY   We recommend the following healthy lifestyle measures: - eat a healthy diet consisting of lots of vegetables, fruits, beans, nuts, seeds, healthy meats such as white chicken and fish and whole grains.  - avoid fried foods, fast food, processed foods, sodas, red meet and other fattening foods.  - get a least 150 minutes of aerobic exercise per week.   Follow up in: CPE in next few months

## 2013-04-16 NOTE — Progress Notes (Signed)
Pre visit review using our clinic review tool, if applicable. No additional management support is needed unless otherwise documented below in the visit note. 

## 2013-05-24 ENCOUNTER — Ambulatory Visit (INDEPENDENT_AMBULATORY_CARE_PROVIDER_SITE_OTHER): Payer: Commercial Managed Care - PPO

## 2013-05-24 ENCOUNTER — Other Ambulatory Visit: Payer: Self-pay | Admitting: Gynecology

## 2013-05-24 DIAGNOSIS — Z92241 Personal history of systemic steroid therapy: Secondary | ICD-10-CM

## 2013-05-24 DIAGNOSIS — IMO0002 Reserved for concepts with insufficient information to code with codable children: Secondary | ICD-10-CM

## 2013-05-24 DIAGNOSIS — Z13820 Encounter for screening for osteoporosis: Secondary | ICD-10-CM

## 2013-05-24 DIAGNOSIS — N951 Menopausal and female climacteric states: Secondary | ICD-10-CM

## 2013-06-04 ENCOUNTER — Encounter: Payer: Self-pay | Admitting: Family Medicine

## 2013-06-04 ENCOUNTER — Ambulatory Visit (INDEPENDENT_AMBULATORY_CARE_PROVIDER_SITE_OTHER): Payer: Commercial Managed Care - PPO | Admitting: Family Medicine

## 2013-06-04 VITALS — BP 128/80 | HR 57 | Temp 98.2°F | Ht 60.75 in | Wt 143.5 lb

## 2013-06-04 DIAGNOSIS — K5900 Constipation, unspecified: Secondary | ICD-10-CM

## 2013-06-04 DIAGNOSIS — R1013 Epigastric pain: Secondary | ICD-10-CM

## 2013-06-04 DIAGNOSIS — Z0000 Encounter for general adult medical examination without abnormal findings: Secondary | ICD-10-CM

## 2013-06-04 DIAGNOSIS — R002 Palpitations: Secondary | ICD-10-CM

## 2013-06-04 DIAGNOSIS — F411 Generalized anxiety disorder: Secondary | ICD-10-CM

## 2013-06-04 DIAGNOSIS — K219 Gastro-esophageal reflux disease without esophagitis: Secondary | ICD-10-CM

## 2013-06-04 DIAGNOSIS — K3189 Other diseases of stomach and duodenum: Secondary | ICD-10-CM

## 2013-06-04 DIAGNOSIS — Z23 Encounter for immunization: Secondary | ICD-10-CM

## 2013-06-04 DIAGNOSIS — K589 Irritable bowel syndrome without diarrhea: Secondary | ICD-10-CM

## 2013-06-04 DIAGNOSIS — M62838 Other muscle spasm: Secondary | ICD-10-CM

## 2013-06-04 LAB — LIPID PANEL
CHOL/HDL RATIO: 3
Cholesterol: 206 mg/dL — ABNORMAL HIGH (ref 0–200)
HDL: 78.8 mg/dL (ref >39.00)
LDL Cholesterol: 118 mg/dL — ABNORMAL HIGH (ref 0–99)
TRIGLYCERIDES: 46 mg/dL (ref 0.0–149.0)
VLDL: 9.2 mg/dL (ref 0.0–40.0)

## 2013-06-04 LAB — HEMOGLOBIN A1C: Hgb A1c MFr Bld: 5.1 % (ref 4.6–6.5)

## 2013-06-04 NOTE — Patient Instructions (Signed)
-  call your gastroenterologist for an appointment: Address: [address], [place], [place] [address]  [phone]  -take the pravacid daily and fiber supplement daily  -get mammogram  -mirilax when constipated once daily in liquid for 3 days and then can do twice daily if not working until soft BM daily for three days then stop  We recommend the following healthy lifestyle measures: - eat a healthy diet consisting of lots of vegetables, fruits, beans, nuts, seeds, healthy meats such as white chicken and fish and whole grains.  - avoid fried foods, fast food, processed foods, sodas, red meet and other fattening foods.  - get a least 150 minutes of aerobic exercise per week.   -We have ordered labs or studies at this visit. It can take up to 1-2 weeks for results and processing. We will contact you with instructions IF your results are abnormal. Normal results will be released to your [facility]. If you have not heard from [place] or can not find your results in [facility] in 2 weeks please contact our office.  -follow up as needed and yearly

## 2013-06-04 NOTE — Progress Notes (Signed)
Pre visit review using our clinic review tool, if applicable. No additional management support is needed unless otherwise documented below in the visit note. 

## 2013-06-04 NOTE — Progress Notes (Signed)
No chief complaint on file.   HPI:  Here for CPE:  -Concerns today/follow up:  GAD/Insomnia: -was to wean of xanax and do sleep hygeine; has weaned off the xanax  -reports: she has been working on the sleep hygeine and is improving -she has started exercise  Palpitations: -takes atenolol  IBS/GERD:  -followed by GI, chronic  -was supposed to do study but did not have time  -no change  -chronic constipation, bloating and poor digestion, GERD, on prevacid - not taking this now -she has not called GI  Chronic neck and shoulder muscle tension: -when flys and from breasts -wants to see plastic surgeon  -Diet: variety of foods, balance and well rounded, larger portion sizes  -Taking Vit D and calcium: yes  -Exercise: started walking and going to the gym  -Diabetes and Dyslipidemia Screening: doing labs today - FASTING  -Hx of HTN: no  -Vaccines: wants tdap; she will check on shingles vaccine  -pap history: sees Dr. Toney Rakes  -FDLMP: N/A  -sexual activity: yes, female partner, no new partners  -wants STI testing: no  -FH breast, colon or ovarian ca: see FH -gets mammogram yearly -sees GI for colonoscopy  -Alcohol, Tobacco, drug use: see social history  Review of Systems - Review of Systems  Constitutional: Negative for fever, weight loss and malaise/fatigue.  HENT: Negative for hearing loss.   Eyes: Negative for blurred vision and double vision.  Respiratory: Negative for cough and shortness of breath.   Cardiovascular: Negative for chest pain and palpitations.  Gastrointestinal: Positive for heartburn and constipation. Negative for blood in stool and melena.  Genitourinary: Negative for dysuria and urgency.  Musculoskeletal: Negative for falls and myalgias.  Skin: Negative for rash.  Neurological: Negative for seizures and headaches.  Endo/Heme/Allergies: Does not bruise/bleed easily.  Psychiatric/Behavioral: Negative for depression and memory loss.      Past Medical History  Diagnosis Date  . Allergic rhinitis   . Mitral valve prolapse   . Palpitations   . GERD (gastroesophageal reflux disease)   . IBS (irritable bowel syndrome)   . Constipation   . Headache(784.0)   . Headache(784.0)   . Neck pain   . Anxiety   . Anemia   . Gestational diabetes     Past Surgical History  Procedure Laterality Date  . Surgery to remove ovarian cyst    . C section x 1    . Laparoscopy    . Colonoscopy      Family History  Problem Relation Age of Onset  . Breast cancer Sister   . Diabetes Sister   . Breast cancer Sister   . Diabetes Sister   . Breast cancer Mother   . Breast cancer Sister   . Heart disease Father   . Diabetes Brother     History   Social History  . Marital Status: Divorced    Spouse Name: N/A    Number of Children: 1  . Years of Education: N/A   Occupational History  . CONTRACTOR    Social History Main Topics  . Smoking status: Never Smoker   . Smokeless tobacco: Never Used     Comment: tried as a teenager  . Alcohol Use: No  . Drug Use: No  . Sexual Activity: Yes    Birth Control/ Protection: None   Other Topics Concern  . 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 Take 12.5mg  daily, Disp: , Rfl: ;  Calcium Carbonate-Vitamin D (CALTRATE 600+D) 600-400 MG-UNIT per tablet, Take 1 tablet by mouth daily., Disp: , Rfl: ;  cholecalciferol (VITAMIN D) 400 UNITS TABS, Take 400 Units by mouth daily., Disp: , Rfl:  fish oil-omega-3 fatty acids 1000 MG capsule, Take 2 g by mouth daily., Disp: , Rfl: ;  Multiple Vitamin (MULTIVITAMINS  PO), Take by mouth., Disp: , Rfl: ;  NONFORMULARY OR COMPOUNDED ITEM, Place 1 application vaginally 2 (two) times a week. Estradiol 0.02 % 1 mL prefilled applicator., Disp: , Rfl: ;  polyethylene glycol (MIRALAX / GLYCOLAX) packet, Take 17 g by mouth daily., Disp: , Rfl:  Probiotic Product (PROBIOTIC DAILY) CAPS, Take 1 capsule by mouth daily., Disp: , Rfl:   EXAM:  Filed Vitals:   06/04/13 0820  BP: 128/80  Pulse: 57  Temp: 98.2 F (36.8 C)    GENERAL: vitals reviewed and listed below, alert, oriented, appears well hydrated and in no acute distress  HEENT: head atraumatic, PERRLA, normal appearance of eyes, ears, nose and mouth. moist mucus membranes.  NECK: supple, no masses or lymphadenopathy  LUNGS: clear to auscultation bilaterally, no rales, rhonchi or wheeze  CV: HRRR, no peripheral edema or cyanosis, normal pedal pulses  BREAST: declined - done with gyn  ABDOMEN: bowel sounds normal, soft, non tender to palpation, no masses, no rebound or guarding  GU: declined done with gyn  RECTAL: refused  SKIN: no rash or abnormal lesions  MS: normal gait, moves all extremities normally  NEURO: CN II-XII grossly intact, normal muscle strength and sensation to light touch on extremities  PSYCH: normal affect, pleasant and cooperative  ASSESSMENT AND PLAN:  Discussed the following assessment and plan:  Encounter for preventive health examination - Plan: Lipid panel, Hemoglobin A1c  Neck muscle spasm -exercises provided, return precuations, supportive care discussed -number for plastic surgeon provided  GERD -restart prevacid follow with GI  CONSTIPATION -fiber, mirilax, follow with GI  Irritable bowel syndrome  ANXIETY  Dyspepsia and other specified disorders of function of stomach  Palpitations  -Discussed and advised all [place] preventive services health task force level A and B recommendations for age, sex and risks.  -Advised at least 150 minutes of exercise  per week and a healthy diet low in saturated fats and sweets and consisting of fresh fruits and vegetables, lean meats such as fish and white chicken and whole grains.  -labs, studies and vaccines per orders this encounter  Orders Placed This Encounter  Procedures  . Lipid panel  . Hemoglobin A1c    Patient Instructions  -call your gastroenterologist for an appointment: Address: [address] [place], [place], [place] [address]  [phone]  -take the pravacid daily and fiber supplement daily  -get mammogram  -mirilax when constipated once daily in liquid for 3 days and then can do twice daily if not working until soft BM daily for three days then stop  We recommend the following healthy lifestyle measures: - eat a healthy diet consisting of lots of vegetables, fruits, beans, nuts, seeds, healthy meats such as white chicken and fish and whole grains.  - avoid  fried foods, fast food, processed foods, sodas, red meet and other fattening foods.  - get a least 150 minutes of aerobic exercise per week.   -follow up as needed and yearly     Patient advised to return to clinic immediately if symptoms worsen or persist or new concerns.    No Follow-up on file.  Lucretia Kern

## 2013-06-04 NOTE — Addendum Note (Signed)
Addended by: Agnes Lawrence on: 06/04/2013 09:48 AM   Modules accepted: Orders

## 2013-07-03 ENCOUNTER — Encounter: Payer: Self-pay | Admitting: Gynecology

## 2013-07-16 ENCOUNTER — Telehealth: Payer: Self-pay | Admitting: Family Medicine

## 2013-07-16 NOTE — Telephone Encounter (Signed)
I called the pt and left a detailed message with the information below and asked that the pt call back with the physicians name.

## 2013-07-16 NOTE — Telephone Encounter (Signed)
Please have her let me know whom she wants to see and I will make a referral, though I prefer she see the same office that started her evaluation.

## 2013-07-16 NOTE — Telephone Encounter (Signed)
Pt wants another referral to see a GI specialist. She does not want to see Dr. Deatra Ina again.

## 2013-07-18 ENCOUNTER — Telehealth: Payer: Self-pay | Admitting: Family Medicine

## 2013-07-18 DIAGNOSIS — K589 Irritable bowel syndrome without diarrhea: Secondary | ICD-10-CM

## 2013-07-18 DIAGNOSIS — K219 Gastro-esophageal reflux disease without esophagitis: Secondary | ICD-10-CM

## 2013-07-18 NOTE — Telephone Encounter (Signed)
Pt states dr Maudie Mercury wanted to know who she would prefer to see: Pt states either Dr Kalman Shan or Dr Elicia Lamp.

## 2013-07-19 NOTE — Telephone Encounter (Signed)
Order placed for referral to Dr Cristina Gong as the pt has been seen by Dr Deatra Ina and requests to change to a different GI physician.

## 2013-07-27 ENCOUNTER — Telehealth: Payer: Self-pay | Admitting: Family Medicine

## 2013-07-27 NOTE — Telephone Encounter (Signed)
Sent referral to [facility] to be scheduled

## 2013-07-27 NOTE — Telephone Encounter (Signed)
Pt states [facility] told her dr buccinni is not accepting any pt that has seen a gi doc before, and she has.. So pt wuld like to switch to dr Maurene Capes for referral

## 2013-07-31 ENCOUNTER — Telehealth: Payer: Self-pay | Admitting: Gastroenterology

## 2013-07-31 NOTE — Telephone Encounter (Signed)
Spoke with pt and let her know that Dr. Olevia Perches is not accepting transfers due to her retiring next year. Asked pt if she wanted to switch to another MD and she said no.

## 2013-08-01 ENCOUNTER — Telehealth: Payer: Self-pay | Admitting: Gastroenterology

## 2013-08-07 NOTE — Telephone Encounter (Signed)
Pt would like to switch from Dr. Deatra Ina to Dr. Ardis Hughs. Dr. Deatra Ina will you approve the switch?

## 2013-08-17 ENCOUNTER — Encounter: Payer: No Typology Code available for payment source | Admitting: Gynecology

## 2013-09-06 NOTE — Telephone Encounter (Signed)
Ok

## 2013-09-18 ENCOUNTER — Telehealth: Payer: Self-pay | Admitting: Gastroenterology

## 2013-09-18 NOTE — Telephone Encounter (Signed)
Dr. Jacobs will you accept?  

## 2013-09-18 NOTE — Telephone Encounter (Signed)
Did you send it to Dr Ardis Hughs for his response?  I can't find it.  Thanks

## 2013-09-18 NOTE — Telephone Encounter (Signed)
The pt was notified and scheduled to see Dr Ardis Hughs 11/23/13

## 2013-09-18 NOTE — Telephone Encounter (Signed)
I don't know why you cannot find it, but it was routed back to [facility]. So I have routed it to Dr Ardis Hughs now.

## 2013-09-18 NOTE — Telephone Encounter (Signed)
Did you get a message about this patient switching?

## 2013-09-18 NOTE — Telephone Encounter (Signed)
Switch is OK with me. 

## 2013-09-18 NOTE — Telephone Encounter (Signed)
Sure did. It's in the telephone messages from July.

## 2013-09-21 ENCOUNTER — Encounter: Payer: Self-pay | Admitting: Gynecology

## 2013-09-21 ENCOUNTER — Ambulatory Visit (INDEPENDENT_AMBULATORY_CARE_PROVIDER_SITE_OTHER): Payer: Commercial Managed Care - PPO | Admitting: Gynecology

## 2013-09-21 VITALS — BP 126/82 | Ht 60.5 in | Wt 144.8 lb

## 2013-09-21 DIAGNOSIS — Z01419 Encounter for gynecological examination (general) (routine) without abnormal findings: Secondary | ICD-10-CM

## 2013-09-21 DIAGNOSIS — N9089 Other specified noninflammatory disorders of vulva and perineum: Secondary | ICD-10-CM

## 2013-09-21 DIAGNOSIS — G4700 Insomnia, unspecified: Secondary | ICD-10-CM

## 2013-09-21 DIAGNOSIS — Z780 Asymptomatic menopausal state: Secondary | ICD-10-CM

## 2013-09-21 DIAGNOSIS — Z79890 Hormone replacement therapy (postmenopausal): Secondary | ICD-10-CM

## 2013-09-21 DIAGNOSIS — N951 Menopausal and female climacteric states: Secondary | ICD-10-CM

## 2013-09-21 DIAGNOSIS — N952 Postmenopausal atrophic vaginitis: Secondary | ICD-10-CM

## 2013-09-21 DIAGNOSIS — Z803 Family history of malignant neoplasm of breast: Secondary | ICD-10-CM

## 2013-09-21 MED ORDER — ZOLPIDEM TARTRATE 10 MG PO TABS: 10.0000 mg | ORAL_TABLET | Freq: Every evening | ORAL | Status: DC | PRN

## 2013-09-21 MED ORDER — ZOLPIDEM TARTRATE 10 MG PO TABS: 10.0000 mg | ORAL_TABLET | Freq: Every evening | ORAL | Status: AC | PRN

## 2013-09-21 MED ORDER — NONFORMULARY OR COMPOUNDED ITEM: Status: DC

## 2013-09-21 MED ORDER — NONFORMULARY OR COMPOUNDED ITEM: 1.0000 "application " | Status: DC

## 2013-09-21 NOTE — Addendum Note (Signed)
Addended by: Terrance Mass on: 09/21/2013 05:03 PM   Modules accepted: Orders, Medications

## 2013-09-21 NOTE — Patient Instructions (Signed)
Shingles Vaccine What You Need to Know WHAT IS SHINGLES?  Shingles is a painful skin rash, often with blisters. It is also called Herpes Zoster or just Zoster.  A shingles rash usually appears on one side of the face or body and lasts from 2 to 4 weeks. Its main symptom is pain, which can be quite severe. Other symptoms of shingles can include fever, headache, chills, and upset stomach. Very rarely, a shingles infection can lead to pneumonia, hearing problems, blindness, brain inflammation (encephalitis), or death.  For about 1 person in 5, severe pain can continue even after the rash clears up. This is called post-herpetic neuralgia.  Shingles is caused by the Varicella Zoster virus. This is the same virus that causes chickenpox. Only someone who has had a case of chickenpox or rarely, has gotten chickenpox vaccine, can get shingles. The virus stays in your body. It can reappear many years later to cause a case of shingles.  You cannot catch shingles from another person with shingles. However, a person who has never had chickenpox (or chickenpox vaccine) could get chickenpox from someone with shingles. This is not very common.  Shingles is far more common in people 50 and older than in younger people. It is also more common in people whose immune systems are weakened because of a disease such as cancer or drugs such as steroids or chemotherapy.  At least 1 million people get shingles per year in the [place]. SHINGLES VACCINE  A vaccine for shingles was licensed in 2006. In clinical trials, the vaccine reduced the risk of shingles by 50%. It can also reduce the pain in people who still get shingles after being vaccinated.  A single dose of shingles vaccine is recommended for adults 60 years of age and older. SOME PEOPLE SHOULD NOT GET SHINGLES VACCINE OR SHOULD WAIT A person should not get shingles vaccine if he or she:  Has ever had a life-threatening allergic reaction to gelatin, the  antibiotic neomycin, or any other component of shingles vaccine. Tell your caregiver if you have any severe allergies.  Has a weakened immune system because of current:  AIDS or another disease that affects the immune system.  Treatment with drugs that affect the immune system, such as prolonged use of high-dose steroids.  Cancer treatment, such as radiation or chemotherapy.  Cancer affecting the bone marrow or lymphatic system, such as leukemia or lymphoma.  Is pregnant, or might be pregnant. Women should not become pregnant until at least 4 weeks after getting shingles vaccine. Someone with a minor illness, such as a cold, may be vaccinated. Anyone with a moderate or severe acute illness should usually wait until he or she recovers before getting the vaccine. This includes anyone with a temperature of 101.3 F (38 C) or higher. WHAT ARE THE RISKS FROM SHINGLES VACCINE?  A vaccine, like any medicine, could possibly cause serious problems, such as severe allergic reactions. However, the risk of a vaccine causing serious harm, or death, is extremely small.  No serious problems have been identified with shingles vaccine. Mild Problems  Redness, soreness, swelling, or itching at the site of the injection (about 1 person in 3).  Headache (about 1 person in 70). Like all vaccines, shingles vaccine is being closely monitored for unusual or severe problems. WHAT IF THERE IS A MODERATE OR SEVERE REACTION? What should I look for? Any unusual condition, such as a severe allergic reaction or a high fever. If a severe allergic reaction   occurred, it would be within a few minutes to an hour after the shot. Signs of a serious allergic reaction can include difficulty breathing, weakness, hoarseness or wheezing, a fast heartbeat, hives, dizziness, paleness, or swelling of the throat. What should I do?  Call your caregiver, or get the person to a caregiver right away.  Tell the caregiver what  happened, the date and time it happened, and when the vaccination was given.  Ask the caregiver to report the reaction by filing a Vaccine Adverse Event Reporting System (VAERS) form. Or, you can file this report through the VAERS web site at www.vaers.hhs.gov or by calling 1-800-822-7967. VAERS does not provide medical advice. HOW CAN I LEARN MORE?  Ask your caregiver. He or she can give you the vaccine package insert or suggest other sources of information.  Contact the Centers for Disease Control and Prevention (CDC):  Call 1-800-232-4636 (1-800-CDC-INFO).  Visit the CDC website at www.cdc.gov/vaccines CDC Shingles Vaccine VIS (10/10/07) Document Released: 10/18/2005 Document Revised: 03/15/2011 Document Reviewed: 04/12/2012 Exit[employer] Patient Information 2015 [employer]. This information is not intended to replace advice given to you by your health care provider. Make sure you discuss any questions you have with your health care provider.  

## 2013-09-21 NOTE — Addendum Note (Signed)
Addended by: Terrance Mass on: 09/21/2013 04:58 PM   Modules accepted: Orders

## 2013-09-21 NOTE — Progress Notes (Signed)
Janice Gross Jun 09, 1955 [id number]   History:    58 y.o.  for annual gyn exam with no complaints today.Patient denies any prior history of abnormal Pap smears. Patient with strong family history of breast cancer as follows:  Mother history of breast cancer  3 sisters with breast cancer  2 nieces with breast cancer  Patient has not received genetic counseling for BRCA1 and BRCA2 testing yet. Patient had a colonoscopy 8  years ago patient had a normal bone density study mammogram this year. Patient not interested in flute vaccine but would like to receive additional vaccine. Patient has suffered also from insomnia.   Past medical history,surgical history, family history and social history were all reviewed and documented in the EPIC chart.  Gynecologic History No LMP recorded. Patient is postmenopausal. Contraception: post menopausal status Last Pap: 2014. Results were: normal Last mammogram: 2015. Results were: normal  Obstetric History OB History  Gravida Para Term Preterm AB SAB TAB Ectopic Multiple Living  1 1        1     # Outcome Date GA Lbr Len/2nd Weight Sex Delivery Anes PTL Lv  1 PAR                ROS: A ROS was performed and pertinent positives and negatives are included in the history.  GENERAL: No fevers or chills. HEENT: No change in vision, no earache, sore throat or sinus congestion. NECK: No pain or stiffness. CARDIOVASCULAR: No chest pain or pressure. No palpitations. PULMONARY: No shortness of breath, cough or wheeze. GASTROINTESTINAL: No abdominal pain, nausea, vomiting or diarrhea, melena or bright red blood per rectum. GENITOURINARY: No urinary frequency, urgency, hesitancy or dysuria. MUSCULOSKELETAL: No joint or muscle pain, no back pain, no recent trauma. DERMATOLOGIC: No rash, no itching, no lesions. ENDOCRINE: No polyuria, polydipsia, no heat or cold intolerance. No recent change in weight. HEMATOLOGICAL: No anemia or easy bruising or bleeding.  NEUROLOGIC: No headache, seizures, numbness, tingling or weakness. PSYCHIATRIC: No depression, no loss of interest in normal activity or change in sleep pattern.     Exam: chaperone present  BP 126/82  Ht 5' 0.5" (1.537 m)  Wt 144 lb 12.8 oz (65.681 kg)  BMI 27.80 kg/m2  Body mass index is 27.8 kg/(m^2).  General appearance : Well developed well nourished female. No acute distress HEENT: Neck supple, trachea midline, no carotid bruits, no thyroidmegaly Lungs: Clear to auscultation, no rhonchi or wheezes, or rib retractions  Heart: Regular rate and rhythm, no murmurs or gallops Breast:Examined in sitting and supine position were symmetrical in appearance, no palpable masses or tenderness,  no skin retraction, no nipple inversion, no nipple discharge, no skin discoloration, no axillary or supraclavicular lymphadenopathy Abdomen: no palpable masses or tenderness, no rebound or guarding Extremities: no edema or skin discoloration or tenderness  Pelvic:  Bartholin, Urethra, Skene Glands: Within normal limits             Vagina: No gross lesions or discharge, atrophic changes  Cervix: No gross lesions or discharge  Uterus  anteverted, normal size, shape and consistency, non-tender and mobile  Adnexa  Without masses or tenderness  Anus and perineum  normal   Rectovaginal  normal sphincter tone without palpated masses or tenderness             Hemoccult card provided     Assessment/Plan:  58 y.o. female for annual exam,  Patient [facility] for genetic counseling  and testing for BRCA1 and BRCA2 since she has strong family history of breast cancer. She'll return back to the office in 1-2 weeks for a pelvic ultrasound for better assessment of her ovaries and we'll do so yearly because of strong family history of breast cancer. Pap smear not done today. Prescription to receive her shingles vaccine was provided. Patient was reminded to do her monthly breast  exam. We discussed importance of calcium vitamin D and regular exercise for osteoporosis prevention. She was reminded to submit to the office the fecal Hemoccult cards for testing at a later date. Patient declined flu vaccine. Prescription for Ambien 10 mg to take 1 by mouth at bedtime when necessary insomnia provided. Prescription refill of her vaginal estrogen to apply twice a week was provided as well.   Note: This dictation was prepared with  Dragon/digital dictation along withSmart phrase technology. Any transcriptional errors that result from this process are unintentional.   Terrance Mass MD, 4:48 PM 09/21/2013

## 2013-09-24 NOTE — Telephone Encounter (Signed)
Appointment scheduled for 11-23-13 w/Dr Ardis [email or web address].yf

## 2013-10-08 ENCOUNTER — Telehealth: Payer: Self-pay | Admitting: Family Medicine

## 2013-10-08 NOTE — Telephone Encounter (Signed)
Advise any restrictions be advised by treated physician in GI. Thanks.

## 2013-10-08 NOTE — Telephone Encounter (Signed)
Patient informed. 

## 2013-10-08 NOTE — Telephone Encounter (Signed)
I left a message at the pts cell number to return my call. 

## 2013-10-08 NOTE — Telephone Encounter (Signed)
Pt said she need a note saying that she can not travel. She is having a lot of flair ups with her IBS and feel she does need to travel right now

## 2013-10-08 NOTE — Telephone Encounter (Signed)
Pt needs note today to go back to work tomorrow

## 2013-10-19 ENCOUNTER — Ambulatory Visit (INDEPENDENT_AMBULATORY_CARE_PROVIDER_SITE_OTHER): Payer: Commercial Managed Care - PPO | Admitting: Gynecology

## 2013-10-19 ENCOUNTER — Encounter: Payer: Self-pay | Admitting: Gynecology

## 2013-10-19 ENCOUNTER — Ambulatory Visit (INDEPENDENT_AMBULATORY_CARE_PROVIDER_SITE_OTHER): Payer: Commercial Managed Care - PPO

## 2013-10-19 DIAGNOSIS — Z780 Asymptomatic menopausal state: Secondary | ICD-10-CM

## 2013-10-19 DIAGNOSIS — N952 Postmenopausal atrophic vaginitis: Secondary | ICD-10-CM

## 2013-10-19 DIAGNOSIS — Z803 Family history of malignant neoplasm of breast: Secondary | ICD-10-CM

## 2013-10-19 NOTE — Progress Notes (Signed)
   Patient presented to the office today for an ultrasound. Patient was uncertain years ago she wanted her ovaries removed with a right or left. She stated pathology report was benign. Patient also vaginal atrophy for which she's on vaginal estrogen plus week. She has a very strong family history of breast cancer in her family as follows:  Mother history of breast cancer  3 sisters with breast cancer  2 nieces with breast cancer  Since she is BRCA1 and BRCA2 testing we decided to do an ultrasound to assess her remaining ovary year. Ultrasound today:  Uterus measures 6.5 3.9 x 2.7 cm with endometrial stripe of 1.7 mm. Normal uterus and right ovary. Left ovary not seen. Both adnexa were normal the flu cul-de-sac.  Patient will be referred to the:[facility] to see the geneticist and to schedule BRCA one BRCA2 testing. Patient was provided with Fecal Hemoccult cards for testing       

## 2013-10-19 NOTE — Patient Instructions (Signed)
BRCA-1 and BRCA-2 BRCA-1 and BRCA-2 are 2 genes that are linked with hereditary breast and ovarian cancers. About 200,000 women are diagnosed with invasive breast cancer each year and about 23,000 with ovarian cancer (according to the American Cancer Society). Of these cancers, about 5% to 10% will be due to a mutation in one of the BRCA genes. Men can also inherit an increased risk of developing breast cancer, primarily from an alteration in the BRCA-2 gene.  Individuals with mutations in BRCA1 or BRCA2 have significantly elevated risks for breast cancer (up to 80% lifetime risk), ovarian cancer (up to 40% lifetime risk), bilateral breast cancer and other types of cancers. BRCA mutations are inherited and passed from generation to generation. One half of the time, they are passed from the father's side of the family.  The DNA in white blood cells is used to detect mutations in the BRCA genes. While the gene products (proteins) of the BRCA genes act only in breast and ovarian tissue, the genes are present in every cell of the body and blood is the most easily accessible source of that DNA. PREPARATION FOR TEST The test for BRCA mutations is done on a blood sample collected by needle from a vein in the arm. The test does not require surgical biopsy of breast or ovarian tissue.  NORMAL FINDINGS No genetic mutations. Ranges for normal findings may vary among different laboratories and hospitals. You should always check with your doctor after having lab work or other tests done to discuss the meaning of your test results and whether your values are considered within normal limits. MEANING OF TEST  Your caregiver will go over the test results with you and discuss the importance and meaning of your results, as well as treatment options and the need for additional tests if necessary. OBTAINING THE TEST RESULTS It is your responsibility to obtain your test results. Ask the lab or department performing the test  when and how you will get your results. OTHER THINGS TO KNOW Your test results may have implications for other family members. When one member of a family is tested for BRCA mutations, issues often arise about how or whether to share this information with other family members. Seek advice from a genetic counselor about communication of result with your family members.  Pre and post test consultation with a health care provider knowledgeable about genetic testing cannot be overemphasized.  There are many issues to be considered when preparing for a genetic test and upon learning the results, and a genetic counselor has the knowledge and experience to help you sort through them.  If the BRCA test is positive, the options include increased frequency of check-ups (e.g., mammography, blood tests for CA-125, or transvaginal ultrasonography); medications that could reduce risk (e.g., oral contraceptives or tamoxifen); or surgical removal of the ovaries or breasts. There are a number of variables involved and it is important to discuss your options with your doctor and genetic counselor. Research studies have reported that for every 1000 women negative for BRCA mutations, between 12 and 45 of them will develop breast cancer by age 50 and between 3 and 4 will develop ovarian cancer by age 50. The risk increases with age. The test can be ordered by a doctor, preferably by one who can also offer genetic counseling. The blood sample will be sent to a laboratory that specializes in BRCA testing. The American Society of Clinical Oncology and the National Breast Cancer Coalition encourage women seeking the   test to participate in long-term outcome studies to help gather information on the effectiveness of different check-up and treatment options. Document Released: 01/15/2004 Document Revised: 03/15/2011 Document Reviewed: 03/23/2013 [employer] Patient Information 2015 [place], [place]. This information is not intended to  replace advice given to you by your health care provider. Make sure you discuss any questions you have with your health care provider.

## 2013-10-22 ENCOUNTER — Telehealth: Payer: Self-pay | Admitting: *Deleted

## 2013-10-22 ENCOUNTER — Telehealth: Payer: Self-pay | Admitting: Genetic Counselor

## 2013-10-22 NOTE — Telephone Encounter (Signed)
S/W PATIENT AND GAVE GENETIC APPT FOR 10/26 @ 1/[facility].  REFERRING DR. Uvaldo Rising DX-FHX OF BREAST CANCER

## 2013-10-22 NOTE — Telephone Encounter (Signed)
Referral form faxed to [facility], they will contact pt to schedule.

## 2013-10-22 NOTE — Telephone Encounter (Signed)
Message copied by Thamas Jaegers on Mon Oct 22, 2013  9:26 AM ------      Message from: Terrance Mass      Created: Fri Oct 19, 2013  3:49 PM       Anderson Malta, please make appointment for this patient with the geneticist at [facility] secondary to strong family history of breast cancer as follows:            Mother history of breast cancer       3 sisters with breast cancer       2 nieces with breast cancer       ------

## 2013-10-22 NOTE — Telephone Encounter (Signed)
LEFT MESSAGE FOR PATIENT TO RETURN CALL TO SCHEDULE GENETIC APPT.  °

## 2013-10-25 NOTE — Telephone Encounter (Signed)
Appointment 10/29/13 @ 11:00am

## 2013-10-29 ENCOUNTER — Other Ambulatory Visit: Payer: Self-pay | Admitting: Pulmonary Disease

## 2013-10-29 ENCOUNTER — Encounter: Payer: Self-pay | Admitting: Genetic Counselor

## 2013-10-29 ENCOUNTER — Ambulatory Visit (HOSPITAL_BASED_OUTPATIENT_CLINIC_OR_DEPARTMENT_OTHER): Payer: Commercial Managed Care - PPO | Admitting: Genetic Counselor

## 2013-10-29 ENCOUNTER — Other Ambulatory Visit: Payer: Commercial Managed Care - PPO

## 2013-10-29 DIAGNOSIS — Z315 Encounter for genetic counseling: Secondary | ICD-10-CM

## 2013-10-29 DIAGNOSIS — Z8041 Family history of malignant neoplasm of ovary: Secondary | ICD-10-CM

## 2013-10-29 DIAGNOSIS — Z803 Family history of malignant neoplasm of breast: Secondary | ICD-10-CM

## 2013-10-29 DIAGNOSIS — Z800 Family history of malignant neoplasm of digestive organs: Secondary | ICD-10-CM

## 2013-10-29 NOTE — Progress Notes (Signed)
Dr.  Toney Rakes requested a consultation for genetic counseling and risk assessment for Janice Gross, a 58 y.o. female, for discussion of her family history of breast, ovarian and stomach cancer.  She presents to clinic today to discuss the possibility of a genetic predisposition to cancer, and to further clarify her risks, as well as her family members' risks for cancer.   HISTORY OF PRESENT ILLNESS: Janice Gross is a 58 y.o. female with no personal history of cancer.    Past Medical History  Diagnosis Date  . Allergic rhinitis   . Mitral valve prolapse   . Palpitations   . GERD (gastroesophageal reflux disease)   . IBS (irritable bowel syndrome)   . Constipation   . Headache(784.0)   . Headache(784.0)   . Neck pain   . Anxiety   . Anemia   . Gestational diabetes     Past Surgical History  Procedure Laterality Date  . Surgery to remove ovarian cyst    . C section x 1    . Laparoscopy    . Colonoscopy      History   Social History  . Marital Status: Divorced    Spouse Name: N/A    Number of Children: 1  . Years of Education: N/A   Occupational History  . CONTRACTOR    Social History Main Topics  . Smoking status: Never Smoker   . Smokeless tobacco: Never Used     Comment: tried as a teenager  . Alcohol Use: No  . Drug Use: No  . Sexual Activity: Yes    Birth Control/ Protection: None   Other Topics Concern  . Not on file   Social History Narrative   Work or School: Scientist, physiological Situation: lives alone      Spiritual Beliefs: Christian      Lifestyle:walking on a regular basis; diet is fair             REPRODUCTIVE HISTORY AND PERSONAL RISK ASSESSMENT FACTORS: Menarche was at age 27.   postmenopausal Uterus Intact: yes Ovaries Intact: yes G1P1A0, first live birth at age 18  She has previously undergone treatment for infertility.   She has used HRT in the past.    FAMILY HISTORY:  We obtained a detailed,  4-generation family history.  Significant diagnoses are listed below: Family History  Problem Relation Age of Onset  . Breast cancer Sister 56  . Diabetes Sister   . Breast cancer Sister 4  . Diabetes Sister   . Breast cancer Mother   . Ovarian cancer Sister 90  . Heart disease Father   . Diabetes Brother   . Lung cancer Brother   . Breast cancer Other     dx in her 50s/dx in her 70s  . Stomach cancer Other 23  . Lung cancer Other     dx in his 74s    Patient's maternal ancestors are of [place] and [place] descent, and paternal ancestors are of African American descent. There is no reported Ashkenazi Jewish ancestry. There is no known consanguinity.  GENETIC COUNSELING ASSESSMENT: Janice Gross is a 58 y.o. female with a family history of breast, ovarian and stomach cancer which somewhat suggestive of a hereditary cancer syndrome and predisposition to cancer. We, therefore, discussed and recommended the following at today's visit.   DISCUSSION: We reviewed the characteristics, features and inheritance patterns of hereditary cancer syndromes. We also discussed genetic testing,  including the appropriate family members to test, the process of testing, insurance coverage and turn-around-time for results.   We reviewed hereditary cancer syndromes most consistent with the cancer in her family including BRCA1/BRCA2, Lynch syndrome and CDH1 mutations.  We reviewed that if Janice Gross is negative, that she is not informative for her family.  Therefore, one of her family members who has cancer should consider testing.  We also discussed genetic discrimination and that insurance and employer discrimination is prohibited by GINA and insurance alone by the affordable care act.  We also reviewed that life, disability and long-term care insurance may not be covered by either GINA or the ACA.  This information gave Janice Gross pause.  She decided to think about genetic testing and will  contact me in the future if she decides to pursue.  PLAN: After considering the risks, benefits, and limitations, Janice Gross provided informed consent to pursue genetic testing but ultimately declined testing because of her concern for genetic discimination. We discussed the implications of a positive, negative and/ or variant of uncertain significance genetic test result. Results should be available within approximately 3 weeks' time, at which point they will be disclosed by telephone to Janice Gross, as will any additional recommendations warranted by these results. Janice Gross will receive a summary of her genetic counseling visit and a copy of her results once available. This information will also be available in Epic. We encouraged Janice Gross to remain in contact with cancer genetics annually so that we can continuously update the family history and inform her of any changes in cancer genetics and testing that may be of benefit for her family. Janice Gross's questions were answered to her satisfaction today. Our contact information was provided should additional questions or concerns arise.  The patient was seen for a total of 60 minutes, greater than 50% of which was spent face-to-face counseling.  This note will also be sent to the referring provider via the electronic medical record. The patient will be supplied with a summary of this genetic counseling discussion as well as educational information on the discussed hereditary cancer syndromes following the conclusion of their visit.   _______________________________________________________________________ For Office Staff:  Number of people involved in session: 1 Was an Intern/ student involved with case: no

## 2013-10-31 ENCOUNTER — Telehealth: Payer: Self-pay | Admitting: Genetic Counselor

## 2013-10-31 NOTE — Telephone Encounter (Signed)
Patient had called to set up an appointment for blood draw.  Contacted Kim in the lab.  Appointment scheduled for 10/29 at 3:45.  Relayed to paitent.  Will leave kit in the lab.

## 2013-11-01 ENCOUNTER — Other Ambulatory Visit: Payer: Commercial Managed Care - PPO

## 2013-11-01 NOTE — Progress Notes (Deleted)
Patient was seen in the lab today to get her blood drawn for OvaNext Cancer Panel from [facility].  Results will be back in 2-4 weeks.

## 2013-11-01 NOTE — Telephone Encounter (Signed)
error 

## 2013-11-02 NOTE — Progress Notes (Signed)
Janice Gross made an appointment for a blood draw for 11/01/2013.  She kept this appointment and had her blood drawn for the OvaNext panel.  This sample will be sent to [facility] for the OvaNext test.  Results should be back in 2-4 weeks.

## 2013-11-05 ENCOUNTER — Encounter: Payer: Self-pay | Admitting: Genetic Counselor

## 2013-11-23 ENCOUNTER — Encounter: Payer: Self-pay | Admitting: Gastroenterology

## 2013-11-23 ENCOUNTER — Telehealth: Payer: Self-pay | Admitting: Genetic Counselor

## 2013-11-23 ENCOUNTER — Encounter: Payer: Self-pay | Admitting: Genetic Counselor

## 2013-11-23 ENCOUNTER — Ambulatory Visit (INDEPENDENT_AMBULATORY_CARE_PROVIDER_SITE_OTHER): Payer: Commercial Managed Care - PPO | Admitting: Gastroenterology

## 2013-11-23 VITALS — BP 122/80 | HR 64 | Ht 65.0 in | Wt 143.0 lb

## 2013-11-23 DIAGNOSIS — R194 Change in bowel habit: Secondary | ICD-10-CM

## 2013-11-23 DIAGNOSIS — Z1379 Encounter for other screening for genetic and chromosomal anomalies: Secondary | ICD-10-CM | POA: Insufficient documentation

## 2013-11-23 DIAGNOSIS — K5900 Constipation, unspecified: Secondary | ICD-10-CM

## 2013-11-23 DIAGNOSIS — R140 Abdominal distension (gaseous): Secondary | ICD-10-CM

## 2013-11-23 MED ORDER — MOVIPREP 100 G PO SOLR: 1.0000 | Freq: Once | ORAL | Status: DC

## 2013-11-23 NOTE — Telephone Encounter (Signed)
Left good news message on VM.  Asked that she CB. 

## 2013-11-23 NOTE — Progress Notes (Signed)
HPI: This is a   very pleasant very pleasant 58 year old woman whom I am meeting for the first time today.    Colonoscopy Dr. Collene Mares 03/2004 for screening; was normal except small internal hemorrhoids.  She thinks she had EGD a long time ago, possibly with Dr. Collene Mares, many years ago.  GES 2005, ? Slight delay at one hour  She had bloating, feels food not digesting well.  Alternating bowel habits, will have to really strain at times.  This change has been in the past year.  Tried miralax, not daily because  She takes probiotics.    She tried fiber gummies for about a month helped but caused a lot of bloating.   Overall stable weight, no blood in her stool.  She had hemocult testing with gyen    Review of systems: Pertinent positive and negative review of systems were noted in the above HPI section. Complete review of systems was performed and was otherwise normal.    Past Medical History  Diagnosis Date  . Allergic rhinitis   . Mitral valve prolapse   . Palpitations   . GERD (gastroesophageal reflux disease)   . IBS (irritable bowel syndrome)   . Constipation   . Headache(784.0)   . Neck pain   . Anxiety   . Anemia   . Gestational diabetes     Past Surgical History  Procedure Laterality Date  . Surgery to remove ovarian cyst    . C section x 1    . Laparoscopy    . Colonoscopy  2006    Dr. Isa Rankin     Current Outpatient Prescriptions  Medication Sig Dispense Refill  . ALPRAZolam (XANAX) 0.25 MG tablet 0.25mg  nightly then wean per instructions provided 60 tablet 0  . atenolol (TENORMIN) 50 MG tablet Take 25 mg by mouth daily. Take 12.5mg  daily    . Calcium Carbonate-Vitamin D (CALTRATE 600+D) 600-400 MG-UNIT per tablet Take 1 tablet by mouth daily.    . cholecalciferol (VITAMIN D) 400 UNITS TABS Take 400 Units by mouth daily.    . fish oil-omega-3 fatty acids 1000 MG capsule Take 2 g by mouth daily.    . NONFORMULARY OR COMPOUNDED ITEM Estradiol 0.02 % 93ml  prefilled applicator Sig: apply twice a week 24 each 4  . polyethylene glycol (MIRALAX / GLYCOLAX) packet Take 17 g by mouth daily.    . Probiotic Product (PROBIOTIC DAILY) CAPS Take 1 capsule by mouth daily.     No current facility-administered medications for this visit.    Allergies as of 11/23/2013 - Review Complete 11/23/2013  Allergen Reaction Noted  . Aspirin Other (See Comments) 03/24/2007  . Iodine Other (See Comments) 04/05/2011    Family History  Problem Relation Age of Onset  . Breast cancer Sister 87  . Diabetes Sister   . Breast cancer Sister 20  . Diabetes Sister   . Breast cancer Mother   . Ovarian cancer Sister 32  . Heart disease Father   . Diabetes Brother   . Lung cancer Brother   . Breast cancer Other     dx in her 50s/dx in her 82s  . Colon cancer Other 23  . Lung cancer Other     dx in his 48s  . Stomach cancer Neg Hx     History   Social History  . Marital Status: Divorced    Spouse Name: N/A    Number of Children: 1  . Years of Education: N/A   Occupational  History  . CONTRACTOR    Social History Main Topics  . Smoking status: Never Smoker   . Smokeless tobacco: Never Used     Comment: tried as a teenager  . Alcohol Use: No  . Drug Use: No  . Sexual Activity: Yes    Birth Control/ Protection: None   Other Topics Concern  . Not on file   Social History Narrative   Work or School: Scientist, physiological Situation: lives alone      Spiritual Beliefs: Christian      Lifestyle:walking on a regular basis; diet is fair                Physical Exam: BP 122/80 mmHg  Pulse 64  Ht 5\' 5"  (1.651 m)  Wt 143 lb (64.864 kg)  BMI 23.80 kg/m2 Constitutional: generally well-appearing Psychiatric: alert and oriented x3 Eyes: extraocular movements intact Mouth: oral pharynx moist, no lesions Neck: supple no lymphadenopathy Cardiovascular: heart regular rate and rhythm Lungs: clear to auscultation bilaterally Abdomen:  soft, nontender, nondistended, no obvious ascites, no peritoneal signs, normal bowel sounds Extremities: no lower extremity edema bilaterally Skin: no lesions on visible extremities    Assessment and plan: 58 y.o. female with  constipation, straining to move her bowels, bloating  I recommended she try fiber supplements on a daily basis. She has some bloating but hoping if I regulate her bowels better bloating will improve as the constipation improves. She will call to report on her symptoms in 3-4 weeks. We will also proceed with colonoscopy at her soonest convenience for her recent change in her bowel habits.

## 2013-11-23 NOTE — Telephone Encounter (Signed)
Revealed Negative testing on OvaNext test.  Genes involved include: ATM, BARD1, BRCA1, BRCA2, BRIP1, CDH1, CHEK2, EPCAM, MLH1, MRE11A, MSH2, MAH6, MUTYH, NBN, NF1, PALB2, PMS2, PTEN, RAD50, RAD51C, RAD51D, SMARCA4, STK11, and TP53.

## 2013-11-23 NOTE — Patient Instructions (Addendum)
You have been scheduled for a colonoscopy. Please follow written instructions given to you at your visit today.  Please pick up your prep kit at the pharmacy within the next 1-3 days. If you use inhalers (even only as needed), please bring them with you on the day of your procedure. Your physician has requested that you go to www.startemmi.com and enter the access code given to you at your visit today.(SENT TO YOUR E-MAIL) This web site gives a general overview about your procedure. However, you should still follow specific instructions given to you by our office regarding your preparation for the procedure.  Please start taking citrucel (orange flavored) powder fiber supplement.  This may cause some bloating at first but that usually goes away. Begin with a small spoonful and work your way up to a large, heaping spoonful daily over a week.  Stop the miralax and probiotic for now.  Call in 4-5 weeks to report on your response.

## 2013-11-26 ENCOUNTER — Encounter: Payer: Self-pay | Admitting: Genetic Counselor

## 2013-12-02 ENCOUNTER — Other Ambulatory Visit: Payer: Self-pay | Admitting: Pulmonary Disease

## 2013-12-04 ENCOUNTER — Other Ambulatory Visit: Payer: Commercial Managed Care - PPO | Admitting: Anesthesiology

## 2013-12-04 DIAGNOSIS — Z1211 Encounter for screening for malignant neoplasm of colon: Secondary | ICD-10-CM

## 2013-12-10 ENCOUNTER — Other Ambulatory Visit: Payer: Self-pay | Admitting: Pulmonary Disease

## 2013-12-12 ENCOUNTER — Telehealth: Payer: Self-pay | Admitting: Family Medicine

## 2013-12-12 NOTE — Telephone Encounter (Signed)
Pt request refill of the following: atenolol (TENORMIN) 50 MG tablet  Pt said this was rx by Dr Priscille Loveless: [facility] [place]

## 2013-12-13 MED ORDER — ATENOLOL 50 MG PO TABS: 25.0000 mg | ORAL_TABLET | Freq: Every day | ORAL | Status: DC

## 2013-12-13 NOTE — Telephone Encounter (Signed)
Patient called back about re-fill request, she states she is completely out of the medication.

## 2013-12-13 NOTE — Telephone Encounter (Signed)
I do not see any other requests. Please rx fro 90 days 1 refill. Thanks.

## 2013-12-13 NOTE — Telephone Encounter (Signed)
Rx was sent and I left a message at the pts cell number this was sent to her pharmacy.

## 2013-12-14 ENCOUNTER — Encounter: Payer: Self-pay | Admitting: Gastroenterology

## 2013-12-14 ENCOUNTER — Ambulatory Visit (AMBULATORY_SURGERY_CENTER): Payer: Commercial Managed Care - PPO | Admitting: Gastroenterology

## 2013-12-14 VITALS — BP 133/75 | HR 57 | Temp 97.6°F | Resp 19 | Ht 65.0 in | Wt 143.0 lb

## 2013-12-14 DIAGNOSIS — K5900 Constipation, unspecified: Secondary | ICD-10-CM

## 2013-12-14 DIAGNOSIS — K5909 Other constipation: Secondary | ICD-10-CM

## 2013-12-14 MED ORDER — SODIUM CHLORIDE 0.9 % IV SOLN: 500.0000 mL | INTRAVENOUS | Status: DC

## 2013-12-14 NOTE — Patient Instructions (Addendum)
YOU HAD AN ENDOSCOPIC PROCEDURE TODAY AT THE [facility]: Refer to the procedure report that was given to you for any specific questions about what was found during the examination.  If the procedure report does not answer your questions, please call your gastroenterologist to clarify.  If you requested that your care partner not be given the details of your procedure findings, then the procedure report has been included in a sealed envelope for you to review at your convenience later.  YOU SHOULD EXPECT: Some feelings of bloating in the abdomen. Passage of more gas than usual.  Walking can help get rid of the air that was put into your GI tract during the procedure and reduce the bloating. If you had a lower endoscopy (such as a colonoscopy or flexible sigmoidoscopy) you may notice spotting of blood in your stool or on the toilet paper. If you underwent a bowel prep for your procedure, then you may not have a normal bowel movement for a few days.  DIET: Your first meal following the procedure should be a light meal and then it is ok to progress to your normal diet.  A half-sandwich or bowl of soup is an example of a good first meal.  Heavy or fried foods are harder to digest and may make you feel nauseous or bloated.  Likewise meals heavy in dairy and vegetables can cause extra gas to form and this can also increase the bloating.  Drink plenty of fluids but you should avoid alcoholic beverages for 24 hours.  ACTIVITY: Your care partner should take you home directly after the procedure.  You should plan to take it easy, moving slowly for the rest of the day.  You can resume normal activity the day after the procedure however you should NOT DRIVE or use heavy machinery for 24 hours (because of the sedation medicines used during the test).    SYMPTOMS TO REPORT IMMEDIATELY: A gastroenterologist can be reached at any hour.  During normal business hours, 8:30 AM to 5:00 PM Monday through Friday,  call [phone].  After hours and on weekends, please call the GI answering service at [phone] who will take a message and have the physician on call contact you.   Following lower endoscopy (colonoscopy or flexible sigmoidoscopy):  Excessive amounts of blood in the stool  Significant tenderness or worsening of abdominal pains  Swelling of the abdomen that is new, acute  Fever of 100F or higher  Following upper endoscopy (EGD)  Vomiting of blood or coffee ground material  New chest pain or pain under the shoulder blades  Painful or persistently difficult swallowing  New shortness of breath  Fever of 100F or higher  Black, tarry-looking stools  FOLLOW UP: If any biopsies were taken you will be contacted by phone or by letter within the next 1-3 weeks.  Call your gastroenterologist if you have not heard about the biopsies in 3 weeks.  Our staff will call the home number listed on your records the next business day following your procedure to check on you and address any questions or concerns that you may have at that time regarding the information given to you following your procedure. This is a courtesy call and so if there is no answer at the home number and we have not heard from you through the emergency physician on call, we will assume that you have returned to your regular daily activities without incident.  SIGNATURES/CONFIDENTIALITY: You and/or your care   partner have signed paperwork which will be entered into your electronic medical record.  These signatures attest to the fact that that the information above on your After Visit Summary has been reviewed and is understood.  Full responsibility of the confidentiality of this discharge information lies with you and/or your care-partner.   CONTINUE DAILY FIBER SUPPLEMENT (CITRACEL)   START DAILY MIRALAX    CALL DR JACOBS OFFICE REPORT RESPONSE IN 4-5 WEEKS

## 2013-12-14 NOTE — Op Note (Signed)
[facility]  [address] [place], [address]   COLONOSCOPY PROCEDURE REPORT  PATIENT: Janice, Gross  MR#: [id number] BIRTHDATE: [birth year] , 58  yrs. old GENDER: female ENDOSCOPIST: Milus Banister, MD PROCEDURE DATE:  12/14/2013 PROCEDURE:   Colonoscopy, diagnostic First Screening Colonoscopy - Avg.  risk and is 50 yrs.  old or older - No.  Prior Negative Screening - Now for repeat screening. N/A  History of Adenoma - Now for follow-up colonoscopy & has been > or = to 3 yrs.  N/A  Polyps Removed Today? No.  Recommend repeat exam, <10 yrs? No. ASA CLASS:   Class II INDICATIONS:change in bowels, constipation. MEDICATIONS: Monitored anesthesia care and Propofol 200 mg IV  DESCRIPTION OF PROCEDURE:   After the risks benefits and alternatives of the procedure were thoroughly explained, informed consent was obtained.  The digital rectal exam revealed no abnormalities of the rectum.   The LB [id number]  endoscope was introduced through the anus and advanced to the cecum, which was identified by both the appendix and ileocecal valve. No adverse events experienced.   The quality of the prep was excellent.  The instrument was then slowly withdrawn as the colon was fully examined.   COLON FINDINGS: A normal appearing cecum, ileocecal valve, and appendiceal orifice were identified.  The ascending, transverse, descending, sigmoid colon, and rectum appeared unremarkable. Retroflexed views revealed no abnormalities. The time to cecum=2 minutes 29 seconds.  Withdrawal time=6 minutes 23 seconds.  The scope was withdrawn and the procedure completed. COMPLICATIONS: There were no immediate complications.  ENDOSCOPIC IMPRESSION: Normal colonoscopy No polyps or cancers  RECOMMENDATIONS: 1. You should continue to follow colorectal cancer screening guidelines for "routine risk" patients with a repeat colonoscopy in 10 years. 2. Please continue once daily fiber  supplement (citrucel).  Also please start once daily miralax.  Please call Dr. Ardis Hughs' office in 4-5 weeks to report on your response.  eSigned:  Milus Banister, MD 12/14/2013 11:52 AM

## 2013-12-14 NOTE — Progress Notes (Signed)
Report to PACU, RN, vss, BBS= Clear.  

## 2013-12-17 ENCOUNTER — Telehealth: Payer: Self-pay | Admitting: *Deleted

## 2013-12-17 NOTE — Telephone Encounter (Signed)
No answer, message left for the patient. 

## 2014-06-01 ENCOUNTER — Other Ambulatory Visit: Payer: Self-pay | Admitting: Family Medicine

## 2014-06-06 ENCOUNTER — Other Ambulatory Visit: Payer: Self-pay | Admitting: Family Medicine

## 2015-03-11 ENCOUNTER — Telehealth: Payer: Self-pay | Admitting: *Deleted

## 2015-03-11 MED ORDER — NONFORMULARY OR COMPOUNDED ITEM: Status: DC

## 2015-03-11 NOTE — Telephone Encounter (Signed)
Pt called requesting refill on estradiol 0.02% vaginal cream , annual was scheduled 03/26/15, but rescheduled per office. Annual now scheduled on 05/08/15, Rx called in, pt aware.

## 2015-03-26 ENCOUNTER — Encounter: Payer: Commercial Managed Care - PPO | Admitting: Gynecology

## 2015-05-08 ENCOUNTER — Encounter: Payer: Self-pay | Admitting: Gynecology

## 2015-05-08 ENCOUNTER — Ambulatory Visit (INDEPENDENT_AMBULATORY_CARE_PROVIDER_SITE_OTHER): Payer: No Typology Code available for payment source | Admitting: Gynecology

## 2015-05-08 VITALS — BP 142/90 | Ht 60.0 in | Wt 139.0 lb

## 2015-05-08 DIAGNOSIS — I10 Essential (primary) hypertension: Secondary | ICD-10-CM | POA: Diagnosis not present

## 2015-05-08 DIAGNOSIS — Z803 Family history of malignant neoplasm of breast: Secondary | ICD-10-CM

## 2015-05-08 DIAGNOSIS — F419 Anxiety disorder, unspecified: Secondary | ICD-10-CM | POA: Insufficient documentation

## 2015-05-08 DIAGNOSIS — Z01419 Encounter for gynecological examination (general) (routine) without abnormal findings: Secondary | ICD-10-CM

## 2015-05-08 DIAGNOSIS — Z780 Asymptomatic menopausal state: Secondary | ICD-10-CM | POA: Diagnosis not present

## 2015-05-08 DIAGNOSIS — G4700 Insomnia, unspecified: Secondary | ICD-10-CM

## 2015-05-08 DIAGNOSIS — N952 Postmenopausal atrophic vaginitis: Secondary | ICD-10-CM | POA: Diagnosis not present

## 2015-05-08 DIAGNOSIS — Z79890 Hormone replacement therapy (postmenopausal): Secondary | ICD-10-CM

## 2015-05-08 MED ORDER — ALPRAZOLAM 0.25 MG PO TABS: ORAL_TABLET | ORAL | Status: DC

## 2015-05-08 MED ORDER — NONFORMULARY OR COMPOUNDED ITEM: Status: DC

## 2015-05-08 MED ORDER — ATENOLOL 50 MG PO TABS: 50.0000 mg | ORAL_TABLET | Freq: Every day | ORAL | Status: DC

## 2015-05-08 NOTE — Patient Instructions (Signed)
Atenolol tablets What is this medicine? ATENOLOL (a TEN oh lole) is a beta-blocker. Beta-blockers reduce the workload on the heart and help it to beat more regularly. This medicine is used to treat high blood pressure and to prevent chest pain. It is also used to protect the heart during a heart attack and to prevent an additional heart attack from occurring. This medicine may be used for other purposes; ask your health care provider or pharmacist if you have questions. What should I tell my health care provider before I take this medicine? They need to know if you have any of these conditions: -diabetes -heart or vessel disease like slow heart rate, worsening heart failure, heart block, sick sinus syndrome or Raynaud's disease -kidney disease -lung or breathing disease, like asthma or emphysema -pheochromocytoma -thyroid disease -an unusual or allergic reaction to atenolol, other beta-blockers, medicines, foods, dyes, or preservatives -pregnant or trying to get pregnant -breast-feeding How should I use this medicine? Take this medicine by mouth with a drink of water. Follow the directions on the prescription label. This medicine may be taken with or without food. Take your medicine at regular intervals. Do not take more medicine than directed. Do not stop taking this medicine suddenly. This could lead to serious heart-related effects. Talk to your pediatrician regarding the use of this medicine in children. Special care may be needed. Overdosage: If you think you have taken too much of this medicine contact a poison control center or emergency room at once. NOTE: This medicine is only for you. Do not share this medicine with others. What if I miss a dose? If you miss a dose, take it as soon as you can. If it is almost time for your next dose, take only that dose. Do not take double or extra doses. What may interact with this medicine? This medicine may interact with the following  medications: -certain medicines for blood pressure, heart disease, irregular heart beat -clonidine -digoxin -diuretics -dobutamine -epinephrine -isoproterenol -NSAIDs, medicines for pain and inflammation, like ibuprofen or naproxen -reserpine This list may not describe all possible interactions. Give your health care provider a list of all the medicines, herbs, non-prescription drugs, or dietary supplements you use. Also tell them if you smoke, drink alcohol, or use illegal drugs. Some items may interact with your medicine. What should I watch for while using this medicine? Visit your doctor or health care professional for regular check ups. Check your blood pressure and pulse rate regularly. Ask your health care professional what your blood pressure and pulse rate should be, and when you should contact him or her. You may get drowsy or dizzy. Do not drive, use machinery, or do anything that needs mental alertness until you know how this medicine affects you. Do not stand or sit up quickly. Alcohol may interfere with the effect of this medicine. Avoid alcoholic drinks. This medicine can affect blood sugar levels. If you have diabetes, check with your doctor or health care professional before you change your diet or the dose of your diabetic medicine. Do not treat yourself for coughs, colds, or pain while you are taking this medicine without asking your doctor or health care professional for advice. Some ingredients may increase your blood pressure. What side effects may I notice from receiving this medicine? Side effects that you should report to your doctor or health care professional as soon as possible: -allergic reactions like skin rash, itching or hives, swelling of the face, lips, or tongue -breathing problems -  changes in vision -chest pain -cold, tingling, or numb hands or feet -depression -fast, irregular heartbeat -feeling faint or lightheaded, falls -fever with sore throat -rapid  weight gain -swollen ankles, legs Side effects that usually do not require medical attention (report to your doctor or health care professional if they continue or are bothersome): -anxiety, nervous -diarrhea -dry skin -change in sex drive or performance -headache -nightmares or trouble sleeping -short term memory loss -stomach upset -unusually tired This list may not describe all possible side effects. Call your doctor for medical advice about side effects. You may report side effects to FDA at 1-800-FDA-1088. Where should I keep my medicine? Keep out of the reach of children. Store at room temperature between 20 and 25 degrees C (68 and 77 degrees F). Close tightly and protect from light. Throw away any unused medicine after the expiration date. NOTE: This sheet is a summary. It may not cover all possible information. If you have questions about this medicine, talk to your doctor, pharmacist, or health care provider.    2016, Elsevier/Gold Standard. (2012-08-27 14:21:28) Hypertension Hypertension, commonly called high blood pressure, is when the force of blood pumping through your arteries is too strong. Your arteries are the blood vessels that carry blood from your heart throughout your body. A blood pressure reading consists of a higher number over a lower number, such as 110/72. The higher number (systolic) is the pressure inside your arteries when your heart pumps. The lower number (diastolic) is the pressure inside your arteries when your heart relaxes. Ideally you want your blood pressure below 120/80. Hypertension forces your heart to work harder to pump blood. Your arteries may become narrow or stiff. Having untreated or uncontrolled hypertension can cause heart attack, stroke, kidney disease, and other problems. RISK FACTORS Some risk factors for high blood pressure are controllable. Others are not.  Risk factors you cannot control include:   Race. You may be at higher risk if  you are African American.  Age. Risk increases with age.  Gender. Men are at higher risk than women before age 37 years. After age 8, women are at higher risk than men. Risk factors you can control include:  Not getting enough exercise or physical activity.  Being overweight.  Getting too much fat, sugar, calories, or salt in your diet.  Drinking too much alcohol. SIGNS AND SYMPTOMS Hypertension does not usually cause signs or symptoms. Extremely high blood pressure (hypertensive crisis) may cause headache, anxiety, shortness of breath, and nosebleed. DIAGNOSIS To check if you have hypertension, your health care provider will measure your blood pressure while you are seated, with your arm held at the level of your heart. It should be measured at least twice using the same arm. Certain conditions can cause a difference in blood pressure between your right and left arms. A blood pressure reading that is higher than normal on one occasion does not mean that you need treatment. If it is not clear whether you have high blood pressure, you may be asked to return on a different day to have your blood pressure checked again. Or, you may be asked to monitor your blood pressure at home for 1 or more weeks. TREATMENT Treating high blood pressure includes making lifestyle changes and possibly taking medicine. Living a healthy lifestyle can help lower high blood pressure. You may need to change some of your habits. Lifestyle changes may include:  Following the DASH diet. This diet is high in fruits, vegetables, and whole  grains. It is low in salt, red meat, and added sugars.  Keep your sodium intake below 2,300 mg per day.  Getting at least 30-45 minutes of aerobic exercise at least 4 times per week.  Losing weight if necessary.  Not smoking.  Limiting alcoholic beverages.  Learning ways to reduce stress. Your health care provider may prescribe medicine if lifestyle changes are not enough to  get your blood pressure under control, and if one of the following is true:  You are 46-8 years of age and your systolic blood pressure is above 140.  You are 70 years of age or older, and your systolic blood pressure is above 150.  Your diastolic blood pressure is above 90.  You have diabetes, and your systolic blood pressure is over [id number] or your diastolic blood pressure is over 90.  You have kidney disease and your blood pressure is above 140/90.  You have heart disease and your blood pressure is above 140/90. Your personal target blood pressure may vary depending on your medical conditions, your age, and other factors. HOME CARE INSTRUCTIONS  Have your blood pressure rechecked as directed by your health care provider.   Take medicines only as directed by your health care provider. Follow the directions carefully. Blood pressure medicines must be taken as prescribed. The medicine does not work as well when you skip doses. Skipping doses also puts you at risk for problems.  Do not smoke.   Monitor your blood pressure at home as directed by your health care provider. SEEK MEDICAL CARE IF:   You think you are having a reaction to medicines taken.  You have recurrent headaches or feel dizzy.  You have swelling in your ankles.  You have trouble with your vision. SEEK IMMEDIATE MEDICAL CARE IF:  You develop a severe headache or confusion.  You have unusual weakness, numbness, or feel faint.  You have severe chest or abdominal pain.  You vomit repeatedly.  You have trouble breathing. MAKE SURE YOU:   Understand these instructions.  Will watch your condition.  Will get help right away if you are not doing well or get worse.   This information is not intended to replace advice given to you by your health care provider. Make sure you discuss any questions you have with your health care provider.   Document Released: 12/21/2004 Document Revised: 05/07/2014 Document  Reviewed: 10/13/2012 Elsevier Interactive Patient Education [employer].

## 2015-05-08 NOTE — Progress Notes (Signed)
Janice Gross May 13, 1955 [id number]   History:    60 y.o.  for annual gyn exam who has not been seen in the office since 2015. Since then she had seen the genetic counselor as a result of the following breast cancer family history:  Mother history of breast cancer  3 sisters with breast cancer  2 nieces with breast cancer  Patient stated her genetic testing was all negative to include BRCA1 and BRCA2 testing. She has not seen her family doctor Dr. Maudie Mercury  In over a year. She reports having history of hypertension in the past but stopped taking her atenolol. She has been under a lot of stress recently with her son causing her to be anxious and not sleeping well. She wanted a refill for Xanax 0.25 mg which she takes in a when necessary basis. Her blood pressure today was 144/88 and 142/90 respectively. She reports normal colonoscopy in 2015. She had a normal bone density study in 2015. She has done well with her vaginal estrogen twice a week for vaginal atrophy and reports no vaginal bleeding.  Past medical history,surgical history, family history and social history were all reviewed and documented in the EPIC chart.  Gynecologic History No LMP recorded. Patient is postmenopausal. Contraception: post menopausal status Last Pap: 2014. Results were: normal Last mammogram: 2015. Results were: normal  Obstetric History OB History  Gravida Para Term Preterm AB SAB TAB Ectopic Multiple Living  1 1        1     # Outcome Date GA Lbr Len/2nd Weight Sex Delivery Anes PTL Lv  1 Para                ROS: A ROS was performed and pertinent positives and negatives are included in the history.  GENERAL: No fevers or chills. HEENT: No change in vision, no earache, sore throat or sinus congestion. NECK: No pain or stiffness. CARDIOVASCULAR: No chest pain or pressure. No palpitations. PULMONARY: No shortness of breath, cough or wheeze. GASTROINTESTINAL: No abdominal pain, nausea, vomiting or  diarrhea, melena or bright red blood per rectum. GENITOURINARY: No urinary frequency, urgency, hesitancy or dysuria. MUSCULOSKELETAL: No joint or muscle pain, no back pain, no recent trauma. DERMATOLOGIC: No rash, no itching, no lesions. ENDOCRINE: No polyuria, polydipsia, no heat or cold intolerance. No recent change in weight. HEMATOLOGICAL: No anemia or easy bruising or bleeding. NEUROLOGIC: No headache, seizures, numbness, tingling or weakness. PSYCHIATRIC: No depression, no loss of interest in normal activity or change in sleep pattern.     Exam: chaperone present  BP 142/90 mmHg  Ht 5' (1.524 m)  Wt 139 lb (63.05 kg)  BMI 27.15 kg/m2  Body mass index is 27.15 kg/(m^2).  General appearance : Well developed well nourished female. No acute distress HEENT: Eyes: no retinal hemorrhage or exudates,  Neck supple, trachea midline, no carotid bruits, no thyroidmegaly Lungs: Clear to auscultation, no rhonchi or wheezes, or rib retractions  Heart: Regular rate and rhythm, no murmurs or gallops Breast:Examined in sitting and supine position were symmetrical in appearance, no palpable masses or tenderness,  no skin retraction, no nipple inversion, no nipple discharge, no skin discoloration, no axillary or supraclavicular lymphadenopathy Abdomen: no palpable masses or tenderness, no rebound or guarding Extremities: no edema or skin discoloration or tenderness  Pelvic:  Bartholin, Urethra, Skene Glands: Within normal limits             Vagina: No gross lesions or discharge, atrophic changes  Cervix: No gross lesions or discharge  Uterus  anteverted, normal size, shape and consistency, non-tender and mobile  Adnexa  Without masses or tenderness  Anus and perineum  normal   Rectovaginal  normal sphincter tone without palpated masses or tenderness             Hemoccult cards provided     Assessment/Plan:  60 y.o. female for annual exam will be restarted once again on her atenolol 50 mg daily for  hypertension. Have asked her to maintain a log of her blood pressure readings. She can take her blood pressure work twice a day and take that information to her PCP Dr. Maudie Mercury when she goes season within the next week for fasting blood work. She was reminded to do her monthly self breast examination. A requisition was provided for her to contact the radiology services schedule her mammogram. She will schedule a bone density study in June of this year. Pap smear was done today without HPV. Patient is instructed to do her monthly breast exam. Discussed importance of calcium vitamin D and regular exercise for osteoporosis prevention. Prescription refill for Xanax 0.25 mg take 1 by mouth daily when necessary was provided as well prescription refill for her atenolol and her vaginal estrogen.   Terrance Mass MD, 3:01 PM 05/08/2015

## 2015-05-09 LAB — PAP IG W/ RFLX HPV ASCU

## 2015-05-15 ENCOUNTER — Ambulatory Visit (INDEPENDENT_AMBULATORY_CARE_PROVIDER_SITE_OTHER): Payer: PRIVATE HEALTH INSURANCE | Admitting: Family Medicine

## 2015-05-15 ENCOUNTER — Encounter: Payer: Self-pay | Admitting: Family Medicine

## 2015-05-15 ENCOUNTER — Telehealth: Payer: Self-pay | Admitting: Family Medicine

## 2015-05-15 VITALS — BP 140/90 | HR 76 | Temp 98.7°F | Ht 60.0 in | Wt 142.3 lb

## 2015-05-15 DIAGNOSIS — I10 Essential (primary) hypertension: Secondary | ICD-10-CM

## 2015-05-15 DIAGNOSIS — R002 Palpitations: Secondary | ICD-10-CM

## 2015-05-15 MED ORDER — ATENOLOL 50 MG PO TABS: 50.0000 mg | ORAL_TABLET | Freq: Every day | ORAL | Status: DC

## 2015-05-15 NOTE — Telephone Encounter (Signed)
I called the pt and informed her of the message below

## 2015-05-15 NOTE — Progress Notes (Signed)
Pre visit review using our clinic review tool, if applicable. No additional management support is needed unless otherwise documented below in the visit note. 

## 2015-05-15 NOTE — Progress Notes (Signed)
HPI:  Janice Gross pleasant 60 year old with a past medical history significant for palpitations, MVP, anxiety gestational diabetes here for an acute visit for elevated blood pressure. She has not been in to see [place] in some time.She had been on atenolol chronically for palpitations, but she stopped this about a year ago. She did still notices palpitations, but they were not too bad and did not feel that she needed this medication. When she saw her gynecologist recently he noticed that her blood pressure was elevated and restarted her atenolol. Is taking 25 mg of this daily. She denies chest pain, dyspnea on exertion, exertional chest pain, headache, swelling or vision changes. She has a physical scheduled in about 1 month.family history of hypertension and heart disease in her father.   ROS: See pertinent positives and negatives per HPI.  Past Medical History  Diagnosis Date  . Allergic rhinitis   . Mitral valve prolapse   . Palpitations   . GERD (gastroesophageal reflux disease)   . IBS (irritable bowel syndrome)   . Constipation   . Headache(784.0)   . Neck pain   . Anxiety   . Anemia   . Gestational diabetes     Past Surgical History  Procedure Laterality Date  . Surgery to remove ovarian cyst    . C section x 1    . Laparoscopy    . Colonoscopy  2006    Dr. Isa Rankin     Family History  Problem Relation Age of Onset  . Breast cancer Sister 59  . Diabetes Sister   . Breast cancer Sister 64  . Diabetes Sister   . Breast cancer Mother   . Ovarian cancer Sister 5  . Heart disease Father   . Diabetes Brother   . Lung cancer Brother   . Breast cancer Other     dx in her 50s/dx in her 13s  . Colon cancer Other 23  . Lung cancer Other     dx in his 28s  . Stomach cancer Neg Hx     Social History   Social History  . Marital Status: Divorced    Spouse Name: N/A  . Number of Children: 1  . Years of Education: N/A   Occupational History  . CONTRACTOR     Social History Main Topics  . Smoking status: Never Smoker   . Smokeless tobacco: Never Used     Comment: tried as a teenager  . Alcohol Use: No  . Drug Use: No  . Sexual Activity: Yes    Birth Control/ Protection: None   Other Topics Concern  . 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  ALPRAZolam (XANAX) 0.25 MG tablet, 0.25mg  nightly then wean per instructions provided, Disp: 30 tablet, Rfl: 3 .  atenolol (TENORMIN) 50 MG tablet, Take 1 tablet (50 mg total) by mouth daily., Disp: 90 tablet, Rfl: 3 .  Calcium Carbonate-Vitamin D (CALTRATE 600+D) 600-400 MG-UNIT per tablet, Take 1 tablet by mouth daily., Disp: , Rfl:  .  cholecalciferol (VITAMIN D) 400 UNITS TABS, Take 400 Units by mouth daily., Disp: ,  Rfl:  .  fish oil-omega-3 fatty acids 1000 MG capsule, Take 2 g by mouth daily., Disp: , Rfl:  .  NONFORMULARY OR COMPOUNDED ITEM, Estradiol 0.02 % 71ml prefilled applicator Sig: apply twice a week, Disp: 3 each, Rfl: 5 .  polyethylene glycol (MIRALAX / GLYCOLAX) packet, Take 17 g by mouth daily., Disp: , Rfl:   EXAM:  Filed Vitals:   05/15/15 1705  BP: 140/90  Pulse: 76  Temp: 98.7 F (37.1 C)    Body mass index is 27.79 kg/(m^2).  GENERAL: vitals reviewed and listed above, alert, oriented, appears well hydrated and in no acute distress  HEENT: atraumatic, conjunttiva clear, no obvious abnormalities on inspection of external nose and ears  NECK: no obvious masses on inspection  LUNGS: clear to auscultation bilaterally, no wheezes, rales or rhonchi, good air movement  CV: HRRR, no peripheral edema  MS: moves all extremities without noticeable abnormality  PSYCH: pleasant and  cooperative, no obvious depression or anxiety  ASSESSMENT AND PLAN:  Discussed the following assessment and plan:  Essential hypertension  Palpitations  -discussed various options for treatment of blood pressure - given her history of palpitations but we will go ahead and use the atenolol that she is taking, but will increase the dose to 50 mg daily - Also discussed lifestyle changes at length and advise a healthy whole foods diet and regular aerobic exerciser 10,000 steps a day - We'll plan to check labs including basic metabolic panel, CBC, lipid and hemoglobin A1c at her physical -Patient advised to return or notify a doctor immediately if symptoms worsen or persist or new concerns arise.  Patient Instructions  Please increase your atenolol to 50 mg once daily.  Plan to come fasting for your physical and we will check labs that day. Please drink plenty of water that morning.  Please in sure that you're getting 150 minutes of aerobic exercise weekly or 10,000 steps per day.  You've need a healthy diet. We recommend the following healthy lifestyle measures: - eat a healthy whole foods diet consisting of regular small meals composed of vegetables, fruits, beans, nuts, seeds, healthy meats such as white chicken and fish and whole grains.  - avoid sweets, white starchy foods, fried foods, fast food, processed foods, sodas, red meet and other fattening foods.  - get a least 150-300 minutes of aerobic exercise per week.       Colin Benton R.

## 2015-05-15 NOTE — Telephone Encounter (Signed)
Pt has appointment at 5 pm today for high bp issue. Pt would like to know if she can just increase her  atenolol (TENORMIN) 50 MG tablet   to BID?  Pt has cpe on 6/19 and was hoping not to have to come in today.

## 2015-05-15 NOTE — Telephone Encounter (Signed)
I recommend she keep her appointment today. We need to check her blood pressure.She has not been seen in some time and this is not the best medication for blood pressure.

## 2015-05-15 NOTE — Patient Instructions (Signed)
Please increase your atenolol to 50 mg once daily.  Plan to come fasting for your physical and we will check labs that day. Please drink plenty of water that morning.  Please in sure that you're getting 150 minutes of aerobic exercise weekly or 10,000 steps per day.  You've need a healthy diet. We recommend the following healthy lifestyle measures: - eat a healthy whole foods diet consisting of regular small meals composed of vegetables, fruits, beans, nuts, seeds, healthy meats such as white chicken and fish and whole grains.  - avoid sweets, white starchy foods, fried foods, fast food, processed foods, sodas, red meet and other fattening foods.  - get a least 150-300 minutes of aerobic exercise per week.

## 2015-05-22 ENCOUNTER — Telehealth: Payer: Self-pay | Admitting: Family Medicine

## 2015-05-22 NOTE — Telephone Encounter (Signed)
error 

## 2015-05-22 NOTE — Telephone Encounter (Signed)
Pt would like to know if you are going to change med or increase atenolol. Pt bp this am was 147/94.  So pt took another half 25 mg atenolol. That brought it down to 140/77. Pt does not know what to do.

## 2015-05-22 NOTE — Telephone Encounter (Signed)
Pt states she will take 1 1/2 tabs atenolol (TENORMIN) 50 MG tablet   until she can follow up next week with Dr Maudie Mercury.  Pt works in [employer] and requested latest appointment of the day. Pt will see you at 5 pm next Thurs.  Advised pt to keep a record of her bp readings on the increased dose of atenolol   and bring with her to appointment. Also if she has any concerns in the meantime, please call back.

## 2015-05-22 NOTE — Telephone Encounter (Signed)
Pt call to say that the following med is not working atenolol (TENORMIN) 50 MG tablet  Was told to call back if the med was not working      Pharmacy  [place]

## 2015-05-22 NOTE — Telephone Encounter (Signed)
Can she come today or tomorrow to check bp here and discuss options? Those are not terrible readings for a home cuff, but want to check. Please have her bring cuff with her so we can check her cuff.

## 2015-05-26 ENCOUNTER — Ambulatory Visit (HOSPITAL_COMMUNITY)
Admission: EM | Admit: 2015-05-26 | Discharge: 2015-05-26 | Disposition: A | Discharge status: Home / Self Care | Payer: PRIVATE HEALTH INSURANCE | Attending: Family Medicine | Admitting: Family Medicine

## 2015-05-26 ENCOUNTER — Encounter (HOSPITAL_COMMUNITY): Payer: Self-pay | Admitting: Emergency Medicine

## 2015-05-26 DIAGNOSIS — R5383 Other fatigue: Secondary | ICD-10-CM

## 2015-05-26 DIAGNOSIS — I10 Essential (primary) hypertension: Secondary | ICD-10-CM

## 2015-05-26 NOTE — ED Provider Notes (Signed)
CSN: [id number]     Arrival date & time 05/26/15  1830 History   First MD Initiated Contact with Patient 05/26/15 1939     Chief Complaint  Patient presents with  . Hypertension   (Consider location/radiation/quality/duration/timing/severity/associated sxs/prior Treatment) HPI Comments: Pleasant 60 year old female states she recognizes that she had hypertension approximately one month ago. She states that she spoke with her PCP and was advised to restart her atenolol 25 mg. As she was checking her blood pressure work she noticed that her blood pressure restraining elevated and she called her PCP again. He ruptured dose to 50 mg. Now she is feeling tired and sluggish with occasional dizziness. And she is concerned about her blood pressure not responding as she had expected. She denies chest pain, heaviness, tightness, fullness or pressure. Denies shortness of breath denies edema, DOE, leg pain or neurologic symptoms.  Patient is a 60 y.o. female presenting with hypertension.  Hypertension Pertinent negatives include no headaches.    Past Medical History  Diagnosis Date  . Allergic rhinitis   . Mitral valve prolapse   . Palpitations   . GERD (gastroesophageal reflux disease)   . IBS (irritable bowel syndrome)   . Constipation   . Headache(784.0)   . Neck pain   . Anxiety   . Anemia   . Gestational diabetes    Past Surgical History  Procedure Laterality Date  . Surgery to remove ovarian cyst    . C section x 1    . Laparoscopy    . Colonoscopy  2006    Dr. Isa Rankin    Family History  Problem Relation Age of Onset  . Breast cancer Sister 50  . Diabetes Sister   . Breast cancer Sister 67  . Diabetes Sister   . Breast cancer Mother   . Ovarian cancer Sister 51  . Heart disease Father   . Diabetes Brother   . Lung cancer Brother   . Breast cancer Other     dx in her 50s/dx in her 19s  . Colon cancer Other 23  . Lung cancer Other     dx in his 86s  . Stomach cancer  Neg Hx    Social History  Substance Use Topics  . Smoking status: Never Smoker   . Smokeless tobacco: Never Used     Comment: tried as a teenager  . Alcohol Use: No   OB History    Gravida Para Term Preterm AB TAB SAB Ectopic Multiple Living   1 1        1      Review of Systems  Constitutional: Positive for activity change. Negative for fever, diaphoresis and unexpected weight change.  HENT: Negative.   Eyes: Negative.   Respiratory: Negative.   Cardiovascular: Negative.   Genitourinary: Negative.   Musculoskeletal: Negative.   Skin: Negative.   Neurological: Positive for dizziness, syncope and weakness. Negative for tremors, seizures, facial asymmetry, speech difficulty, numbness and headaches.  Hematological: Negative.   Psychiatric/Behavioral: Negative.     Allergies  Aspirin and Iodine  Home Medications   Prior to Admission medications   Medication Sig Start Date End Date Taking? Authorizing Provider  ALPRAZolam Duanne Moron) 0.25 MG tablet 0.25mg  nightly then wean per instructions provided 05/08/15   Terrance Mass, MD  atenolol (TENORMIN) 50 MG tablet Take 1 tablet (50 mg total) by mouth daily. 05/15/15   Lucretia Kern, DO  Calcium Carbonate-Vitamin D (CALTRATE 600+D) 600-400 MG-UNIT per tablet Take 1 tablet by  mouth daily.    Historical Provider, MD  cholecalciferol (VITAMIN D) 400 UNITS TABS Take 400 Units by mouth daily.    Historical Provider, MD  fish oil-omega-3 fatty acids 1000 MG capsule Take 2 g by mouth daily.    Historical Provider, MD  NONFORMULARY OR COMPOUNDED ITEM Estradiol 0.02 % 31ml prefilled applicator Sig: apply twice a week 05/08/15   Terrance Mass, MD  polyethylene glycol [facility] / GLYCOLAX) packet Take 17 g by mouth daily.    Historical Provider, MD   Meds Ordered and Administered this Visit  Medications - No data to display  BP 175/89 mmHg  Pulse 62  Temp(Src) 98.6 F (37 C) (Oral)  Resp 18  SpO2 100% No data found.   Physical Exam   Constitutional: She is oriented to person, place, and time. She appears well-developed and well-nourished. No distress.  HENT:  Head: Normocephalic and atraumatic.  Eyes: Conjunctivae and EOM are normal. Pupils are equal, round, and reactive to light.  Neck: Normal range of motion. Neck supple.  Cardiovascular: Normal rate, regular rhythm, normal heart sounds and intact distal pulses.   Pulmonary/Chest: Effort normal and breath sounds normal. No respiratory distress. She has no wheezes. She has no rales.  Musculoskeletal: Normal range of motion. She exhibits no edema.  Neurological: She is alert and oriented to person, place, and time. No cranial nerve deficit. She exhibits normal muscle tone.  Skin: Skin is warm and dry.  Psychiatric: She has a normal mood and affect.  Nursing note and vitals reviewed.   ED Course  Procedures (including critical care time)  Labs Review Labs Reviewed - No data to display  Imaging Review No results found.   Visual Acuity Review  Right Eye Distance:   Left Eye Distance:   Bilateral Distance:    Right Eye Near:   Left Eye Near:    Bilateral Near:         MDM   1. Essential hypertension   2. Other fatigue    Long discussion with patient regarding hypertension and medication. Likely the beta blocker increases causing her to have some tiredness and sluggishness. She has no signs or symptoms of cardiovascular or neurologic events. Physical exam is unremarkable. She is fully awake and alert and with no signs of distress. We discussed the best plan for her is to call her PCP tomorrow and let them know of her visit to the urgent care tonight and make a decision on a medication change by her PCP. For any new symptoms or problems such as chest pain, syncope, shortness of breath or other problems may need to go to the emergency department however she is stable at this time and without the symptoms.    Janne Napoleon, NP 05/26/15 2000

## 2015-05-26 NOTE — ED Notes (Signed)
Patient saw ob/gyn last month.  They noted that blood pressure was running high and instructed patient to contact her pcp.  Patient was started on atenolol.  Patient has been checking blood pressure at work and has been in contact with primary.  Atenolol was increased to 50mg  recently.  Patient reports not feeling well and checked blood pressure at work again.  Continues to report elevated blood pressure.  Patient did not call primary today.

## 2015-05-26 NOTE — Discharge Instructions (Signed)
Hypertension Hypertension, commonly called high blood pressure, is when the force of blood pumping through your arteries is too strong. Your arteries are the blood vessels that carry blood from your heart throughout your body. A blood pressure reading consists of a higher number over a lower number, such as 110/72. The higher number (systolic) is the pressure inside your arteries when your heart pumps. The lower number (diastolic) is the pressure inside your arteries when your heart relaxes. Ideally you want your blood pressure below 120/80. Hypertension forces your heart to work harder to pump blood. Your arteries may become narrow or stiff. Having untreated or uncontrolled hypertension can cause heart attack, stroke, kidney disease, and other problems. RISK FACTORS Some risk factors for high blood pressure are controllable. Others are not.  Risk factors you cannot control include:   Race. You may be at higher risk if you are African American.  Age. Risk increases with age.  Gender. Men are at higher risk than women before age 45 years. After age 65, women are at higher risk than men. Risk factors you can control include:  Not getting enough exercise or physical activity.  Being overweight.  Getting too much fat, sugar, calories, or salt in your diet.  Drinking too much alcohol. SIGNS AND SYMPTOMS Hypertension does not usually cause signs or symptoms. Extremely high blood pressure (hypertensive crisis) may cause headache, anxiety, shortness of breath, and nosebleed. DIAGNOSIS To check if you have hypertension, your health care provider will measure your blood pressure while you are seated, with your arm held at the level of your heart. It should be measured at least twice using the same arm. Certain conditions can cause a difference in blood pressure between your right and left arms. A blood pressure reading that is higher than normal on one occasion does not mean that you need treatment. If  it is not clear whether you have high blood pressure, you may be asked to return on a different day to have your blood pressure checked again. Or, you may be asked to monitor your blood pressure at home for 1 or more weeks. TREATMENT Treating high blood pressure includes making lifestyle changes and possibly taking medicine. Living a healthy lifestyle can help lower high blood pressure. You may need to change some of your habits. Lifestyle changes may include:  Following the DASH diet. This diet is high in fruits, vegetables, and whole grains. It is low in salt, red meat, and added sugars.  Keep your sodium intake below 2,300 mg per day.  Getting at least 30-45 minutes of aerobic exercise at least 4 times per week.  Losing weight if necessary.  Not smoking.  Limiting alcoholic beverages.  Learning ways to reduce stress. Your health care provider may prescribe medicine if lifestyle changes are not enough to get your blood pressure under control, and if one of the following is true:  You are 18-59 years of age and your systolic blood pressure is above 140.  You are 60 years of age or older, and your systolic blood pressure is above 150.  Your diastolic blood pressure is above 90.  You have diabetes, and your systolic blood pressure is over 140 or your diastolic blood pressure is over 90.  You have kidney disease and your blood pressure is above 140/90.  You have heart disease and your blood pressure is above 140/90. Your personal target blood pressure may vary depending on your medical conditions, your age, and other factors. HOME CARE INSTRUCTIONS    Have your blood pressure rechecked as directed by your health care provider.   Take medicines only as directed by your health care provider. Follow the directions carefully. Blood pressure medicines must be taken as prescribed. The medicine does not work as well when you skip doses. Skipping doses also puts you at risk for  problems.  Do not smoke.   Monitor your blood pressure at home as directed by your health care provider. SEEK MEDICAL CARE IF:   You think you are having a reaction to medicines taken.  You have recurrent headaches or feel dizzy.  You have swelling in your ankles.  You have trouble with your vision. SEEK IMMEDIATE MEDICAL CARE IF:  You develop a severe headache or confusion.  You have unusual weakness, numbness, or feel faint.  You have severe chest or abdominal pain.  You vomit repeatedly.  You have trouble breathing. MAKE SURE YOU:   Understand these instructions.  Will watch your condition.  Will get help right away if you are not doing well or get worse.   This information is not intended to replace advice given to you by your health care provider. Make sure you discuss any questions you have with your health care provider.   Document Released: 12/21/2004 Document Revised: 05/07/2014 Document Reviewed: 10/13/2012 Elsevier Interactive Patient Education 2016 [employer] Your High Blood Pressure Blood pressure is a measurement of how forceful your blood is pressing against the walls of the arteries. Arteries are muscular tubes within the circulatory system. Blood pressure does not stay the same. Blood pressure rises when you are active, excited, or nervous; and it lowers during sleep and relaxation. If the numbers measuring your blood pressure stay above normal most of the time, you are at risk for health problems. High blood pressure (hypertension) is a long-term (chronic) condition in which blood pressure is elevated. A blood pressure reading is recorded as two numbers, such as 120 over 80 (or 120/80). The first, higher number is called the systolic pressure. It is a measure of the pressure in your arteries as the heart beats. The second, lower number is called the diastolic pressure. It is a measure of the pressure in your arteries as the heart relaxes  between beats.  Keeping your blood pressure in a normal range is important to your overall health and prevention of health problems, such as heart disease and stroke. When your blood pressure is uncontrolled, your heart has to work harder than normal. High blood pressure is a very common condition in adults because blood pressure tends to rise with age. Men and women are equally likely to have hypertension but at different times in life. Before age 33, men are more likely to have hypertension. After 62 years of age, women are more likely to have it. Hypertension is especially common in African Americans. This condition often has no signs or symptoms. The cause of the condition is usually not known. Your caregiver can help you come up with a plan to keep your blood pressure in a normal, healthy range. BLOOD PRESSURE STAGES Blood pressure is classified into four stages: normal, prehypertension, stage 1, and stage 2. Your blood pressure reading will be used to determine what type of treatment, if any, is necessary. Appropriate treatment options are tied to these four stages:  Normal  Systolic pressure (mm Hg): below 120.  Diastolic pressure (mm Hg): below 80. Prehypertension  Systolic pressure (mm Hg): 120 to 139.  Diastolic pressure (mm Hg): 80  to 89. Stage1  Systolic pressure (mm Hg): 140 to 159.  Diastolic pressure (mm Hg): 90 to 99. Stage2  Systolic pressure (mm Hg): 160 or above.  Diastolic pressure (mm Hg): 100 or above. RISKS RELATED TO HIGH BLOOD PRESSURE Managing your blood pressure is an important responsibility. Uncontrolled high blood pressure can lead to:  A heart attack.  A stroke.  A weakened blood vessel (aneurysm).  Heart failure.  Kidney damage.  Eye damage.  Metabolic syndrome.  Memory and concentration problems. HOW TO MANAGE YOUR BLOOD PRESSURE Blood pressure can be managed effectively with lifestyle changes and medicines (if needed). Your caregiver will  help you come up with a plan to bring your blood pressure within a normal range. Your plan should include the following: Education  Read all information provided by your caregivers about how to control blood pressure.  Educate yourself on the latest guidelines and treatment recommendations. New research is always being done to further define the risks and treatments for high blood pressure. Lifestylechanges  Control your weight.  Avoid smoking.  Stay physically active.  Reduce the amount of salt in your diet.  Reduce stress.  Control any chronic conditions, such as high cholesterol or diabetes.  Reduce your alcohol intake. Medicines  Several medicines (antihypertensive medicines) are available, if needed, to bring blood pressure within a normal range. Communication  Review all the medicines you take with your caregiver because there may be side effects or interactions.  Talk with your caregiver about your diet, exercise habits, and other lifestyle factors that may be contributing to high blood pressure.  See your caregiver regularly. Your caregiver can help you create and adjust your plan for managing high blood pressure. RECOMMENDATIONS FOR TREATMENT AND FOLLOW-UP  The following recommendations are based on current guidelines for managing high blood pressure in nonpregnant adults. Use these recommendations to identify the proper follow-up period or treatment option based on your blood pressure reading. You can discuss these options with your caregiver.  Systolic pressure of 123456 to [id number] or diastolic pressure of 80 to 89: Follow up with your caregiver as directed.  Systolic pressure of [id number] to 0000000 or diastolic pressure of 90 to 100: Follow up with your caregiver within 2 months.  Systolic pressure above 0000000 or diastolic pressure above [id number]: Follow up with your caregiver within 1 month.  Systolic pressure above [id number] or diastolic pressure above [id number]: Consider antihypertensive  therapy; follow up with your caregiver within 1 week.  Systolic pressure above [id number] or diastolic pressure above 123456: Begin antihypertensive therapy; follow up with your caregiver within 1 week.   This information is not intended to replace advice given to you by your health care provider. Make sure you discuss any questions you have with your health care provider.   Document Released: 09/15/2011 Document Reviewed: 09/15/2011 Elsevier Interactive Patient Education [employer].

## 2015-05-27 ENCOUNTER — Encounter: Payer: Self-pay | Admitting: Family Medicine

## 2015-05-27 ENCOUNTER — Ambulatory Visit (INDEPENDENT_AMBULATORY_CARE_PROVIDER_SITE_OTHER): Payer: PRIVATE HEALTH INSURANCE | Admitting: Family Medicine

## 2015-05-27 VITALS — BP 142/90 | HR 60 | Temp 98.3°F | Ht 60.0 in | Wt 141.3 lb

## 2015-05-27 DIAGNOSIS — I10 Essential (primary) hypertension: Secondary | ICD-10-CM

## 2015-05-27 DIAGNOSIS — R002 Palpitations: Secondary | ICD-10-CM

## 2015-05-27 LAB — CHOLESTEROL, TOTAL: Cholesterol: 228 mg/dL — ABNORMAL HIGH (ref 0–200)

## 2015-05-27 LAB — BASIC METABOLIC PANEL
BUN: 11 mg/dL (ref 6–23)
CALCIUM: 10.1 mg/dL (ref 8.4–10.5)
CO2: 31 mEq/L (ref 19–32)
Chloride: 105 mEq/L (ref 96–112)
Creatinine, Ser: 0.64 mg/dL (ref 0.40–1.20)
GFR: 120.8 mL/min (ref >60.00)
GLUCOSE: 81 mg/dL (ref 70–99)
Potassium: 4.5 mEq/L (ref 3.5–5.1)
Sodium: 143 mEq/L (ref 135–145)

## 2015-05-27 LAB — HDL CHOLESTEROL: HDL: 76.5 mg/dL (ref >39.00)

## 2015-05-27 MED ORDER — HYDROCHLOROTHIAZIDE 25 MG PO TABS: 25.0000 mg | ORAL_TABLET | Freq: Every day | ORAL | Status: DC

## 2015-05-27 NOTE — Patient Instructions (Addendum)
BEFORE YOU LEAVE: -follow up: next month as scheduled for CPE -lab  DECREASE Atenolol to 50mg  nightly  START Hydrochlorthiazide 25 mg daily in the morning  We recommend the following healthy lifestyle measures: - eat a healthy whole foods diet consisting of regular small meals composed of vegetables, fruits, beans, nuts, seeds, healthy meats such as white chicken and fish and whole grains.  - avoid sweets, white starchy foods, fried foods, fast food, processed foods, sodas, red meet and other fattening foods.  - get a least 150-300 minutes of aerobic exercise per week.

## 2015-05-27 NOTE — Progress Notes (Signed)
HPI:  Follow up HTN: -her gynecologist recently restarted her atenolol (she had been on for a long time for palpitations and HTN, then stopped on her own and had elevated BP at gyn visit) -she went to [facility] yesterday and BP elevated and she feels she doesn't tolerate higher dose BB (1.5 tabs, 75mg )as makes her tired - she change dit to taking at night and feels fine, but checking BP at work and remains elevated -denies: CP, SOB, DOE, dizziness, HA, vision changes -diet and exercise: good, avid exerciser and no CP, DOE or symptoms with exercise -also hx mitral valve prolapse/murmur, reports had eval with echo with prior PCP and told fine  ROS: See pertinent positives and negatives per HPI.  Past Medical History  Diagnosis Date  . Allergic rhinitis   . Mitral valve prolapse   . Palpitations   . GERD (gastroesophageal reflux disease)   . IBS (irritable bowel syndrome)   . Constipation   . Headache(784.0)   . Neck pain   . Anxiety   . Anemia   . Gestational diabetes     Past Surgical History  Procedure Laterality Date  . Surgery to remove ovarian cyst    . C section x 1    . Laparoscopy    . Colonoscopy  2006    Dr. Isa Rankin     Family History  Problem Relation Age of Onset  . Breast cancer Sister 28  . Diabetes Sister   . Breast cancer Sister 32  . Diabetes Sister   . Breast cancer Mother   . Ovarian cancer Sister 21  . Heart disease Father   . Diabetes Brother   . Lung cancer Brother   . Breast cancer Other     dx in her 50s/dx in her 77s  . Colon cancer Other 23  . Lung cancer Other     dx in his 90s  . Stomach cancer Neg Hx     Social History   Social History  . Marital Status: Divorced    Spouse Name: N/A  . Number of Children: 1  . Years of Education: N/A   Occupational History  . CONTRACTOR    Social History Main Topics  . Smoking status: Never Smoker   . Smokeless tobacco: Never Used     Comment: tried as a teenager  . Alcohol Use: No  .  Drug Use: No  . Sexual Activity: Yes    Birth Control/ Protection: None   Other Topics Concern  . 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  ALPRAZolam (XANAX) 0.25 MG tablet, 0.25mg  nightly then wean per instructions provided, Disp: 30 tablet, Rfl: 3 .  atenolol (TENORMIN) 50 MG tablet, Take 1 tablet (50 mg total) by mouth daily., Disp: 90 tablet, Rfl: 3 .  Calcium Carbonate-Vitamin D (CALTRATE 600+D) 600-400 MG-UNIT per tablet, Take 1 tablet by mouth daily., Disp: , Rfl:  .  cholecalciferol (VITAMIN D) 400 UNITS TABS, Take 400 Units by mouth daily., Disp: , Rfl:  .  fish oil-omega-3 fatty acids 1000 MG capsule, Take 2 g by mouth daily.,  Disp: , Rfl:  .  NONFORMULARY OR COMPOUNDED ITEM, Estradiol 0.02 % 76ml prefilled applicator Sig: apply twice a week, Disp: 3 each, Rfl: 5 .  polyethylene glycol (MIRALAX / GLYCOLAX) packet, Take 17 g by mouth daily., Disp: , Rfl:  .  hydrochlorothiazide (HYDRODIURIL) 25 MG tablet, Take 1 tablet (25 mg total) by mouth daily., Disp: 90 tablet, Rfl: 0  EXAM:  Filed Vitals:   05/27/15 1326  BP: 142/90  Pulse: 60  Temp: 98.3 F (36.8 C)    Body mass index is 27.6 kg/(m^2).  GENERAL: vitals reviewed and listed above, alert, oriented, appears well hydrated and in no acute distress  HEENT: atraumatic, conjunttiva clear, no obvious abnormalities on inspection of external nose and ears  NECK: no obvious masses on inspection  LUNGS: clear to auscultation bilaterally, no wheezes, rales or rhonchi, good air movement  CV: HRRR, no peripheral edema  MS: moves all extremities without noticeable abnormality  PSYCH: pleasant and cooperative, no obvious depression or  anxiety  ASSESSMENT AND PLAN:  Discussed the following assessment and plan:  Essential hypertension - Plan: Basic metabolic panel, Cholesterol, Total, HDL cholesterol  Palpitations  -BMP -feels fine, but advised decrease BB given HR on low end of normal and start a different antihypertensive - she opted to try hctz -follow up in 1 month, sooner as needed -Patient advised to return or notify a doctor immediately if symptoms worsen or persist or new concerns arise.  Patient Instructions  BEFORE YOU LEAVE: -follow up: 4 weeks -lab  DECREASE Atenolol to 50mg  nightly  START Hydrochlorthiazide 25 mg daily in the morning  We recommend the following healthy lifestyle measures: - eat a healthy whole foods diet consisting of regular small meals composed of vegetables, fruits, beans, nuts, seeds, healthy meats such as white chicken and fish and whole grains.  - avoid sweets, white starchy foods, fried foods, fast food, processed foods, sodas, red meet and other fattening foods.  - get a least 150-300 minutes of aerobic exercise per week.         Colin Benton R.

## 2015-05-27 NOTE — Progress Notes (Signed)
Pre visit review using our clinic review tool, if applicable. No additional management support is needed unless otherwise documented below in the visit note. 

## 2015-05-29 ENCOUNTER — Ambulatory Visit: Payer: PRIVATE HEALTH INSURANCE | Admitting: Family Medicine

## 2015-05-30 ENCOUNTER — Encounter: Payer: Commercial Managed Care - PPO | Admitting: Family Medicine

## 2015-06-04 ENCOUNTER — Telehealth: Payer: Self-pay | Admitting: Family Medicine

## 2015-06-04 NOTE — Telephone Encounter (Signed)
Pt states she can hardly get out of the bed and things there new bp med hydrochlorothiazide (HYDRODIURIL) 25 MG tablet  Pt had to work today, so made the decision to half the pill. Pt states it is perforated, but would like Dr Maudie Mercury to confirm this is ok.  Pt aware Dr Maudie Mercury is out today.

## 2015-06-04 NOTE — Telephone Encounter (Signed)
Left message for patient to return phone call.  

## 2015-06-04 NOTE — Telephone Encounter (Signed)
She can take half a pill but follow up with dr. Maudie Mercury

## 2015-06-04 NOTE — Telephone Encounter (Signed)
Please advise in Dr. Julianne Rice absence.

## 2015-06-04 NOTE — Telephone Encounter (Signed)
Tried calling 2nd time with NA.

## 2015-06-05 NOTE — Telephone Encounter (Signed)
I left a message for the pt to return my call. 

## 2015-06-05 NOTE — Telephone Encounter (Signed)
Patient called back and stated she took it upon herself to begin taking half and is feeling better.  States she will see how she does, has an appt for later this month and will call back sooner if needed for an earlier appt.

## 2015-06-05 NOTE — Telephone Encounter (Signed)
Not sure I understand question. Ok to take 1/2 pill if she feels ok on this. may be able to increase once she gets use to it. I am not sure what she means by not being able to get out of bed. Would advised appointment if this continues as this is not a common reaction to this medication.

## 2015-06-10 ENCOUNTER — Other Ambulatory Visit: Payer: Self-pay | Admitting: Anesthesiology

## 2015-06-10 DIAGNOSIS — Z1211 Encounter for screening for malignant neoplasm of colon: Secondary | ICD-10-CM

## 2015-06-19 ENCOUNTER — Telehealth: Payer: Self-pay | Admitting: Family Medicine

## 2015-06-19 NOTE — Telephone Encounter (Signed)
Pt is sch for cpx on 6-19 and already had lipid and bmp panel done on 05-27-15. Pt would like to know if she should still come in fasting. Please advise

## 2015-06-19 NOTE — Telephone Encounter (Signed)
Pt is aware to come in fasting tomorrow

## 2015-06-19 NOTE — Telephone Encounter (Signed)
Yes, as that lab was not fasting and was not a full cholesterol panel. Unfortunately it was abnormal, and we need to repeat it fasting after she has worked on her diet and exercise.

## 2015-06-23 ENCOUNTER — Encounter: Payer: Commercial Managed Care - PPO | Admitting: Family Medicine

## 2015-08-04 ENCOUNTER — Encounter: Payer: PRIVATE HEALTH INSURANCE | Admitting: Family Medicine

## 2015-09-05 ENCOUNTER — Encounter: Payer: PRIVATE HEALTH INSURANCE | Admitting: Family Medicine

## 2015-09-26 ENCOUNTER — Other Ambulatory Visit: Payer: Self-pay | Admitting: Family Medicine

## 2015-11-16 ENCOUNTER — Other Ambulatory Visit: Payer: Self-pay | Admitting: Family Medicine

## 2015-11-25 ENCOUNTER — Other Ambulatory Visit: Payer: Self-pay | Admitting: Gynecology

## 2015-11-25 ENCOUNTER — Ambulatory Visit (INDEPENDENT_AMBULATORY_CARE_PROVIDER_SITE_OTHER): Payer: 59

## 2015-11-25 DIAGNOSIS — Z13820 Encounter for screening for osteoporosis: Secondary | ICD-10-CM

## 2015-11-25 DIAGNOSIS — Z780 Asymptomatic menopausal state: Secondary | ICD-10-CM | POA: Diagnosis not present

## 2015-12-01 ENCOUNTER — Other Ambulatory Visit: Payer: Self-pay | Admitting: *Deleted

## 2015-12-01 DIAGNOSIS — E559 Vitamin D deficiency, unspecified: Secondary | ICD-10-CM

## 2015-12-01 DIAGNOSIS — Z1321 Encounter for screening for nutritional disorder: Secondary | ICD-10-CM

## 2015-12-03 ENCOUNTER — Telehealth: Payer: Self-pay

## 2015-12-03 DIAGNOSIS — I341 Nonrheumatic mitral (valve) prolapse: Secondary | ICD-10-CM

## 2015-12-03 NOTE — Telephone Encounter (Signed)
Janice Gross @ [facility] was calling regarding pt. She wanted to know about pre-medicating for dental procedures due to pt's mitral valve prolapse. She wanted ok to give pt amoxicillin as pre-med. Per pt's chart there are no allergies to any abx. This medication was approved. Nothing further needed.

## 2015-12-31 ENCOUNTER — Other Ambulatory Visit: Payer: Self-pay | Admitting: Family Medicine

## 2016-01-02 ENCOUNTER — Telehealth: Payer: Self-pay | Admitting: Family Medicine

## 2016-01-02 MED ORDER — HYDROCHLOROTHIAZIDE 25 MG PO TABS: 25.0000 mg | ORAL_TABLET | Freq: Every day | ORAL | 0 refills | Status: DC

## 2016-01-02 NOTE — Telephone Encounter (Signed)
Rx done. 

## 2016-01-02 NOTE — Telephone Encounter (Signed)
Pt has schedule an appt for 01-13-2016. Pt needs refill on hctz [facility] [address]

## 2016-01-13 ENCOUNTER — Ambulatory Visit: Payer: Self-pay | Admitting: Family Medicine

## 2016-02-06 ENCOUNTER — Ambulatory Visit: Payer: Self-pay | Admitting: Family Medicine

## 2016-02-06 ENCOUNTER — Telehealth: Payer: Self-pay | Admitting: *Deleted

## 2016-02-06 NOTE — Telephone Encounter (Signed)
Patient was a no-show for today's appt and I called and left a detailed message to check on her and asked that she call back to reschedule.

## 2016-02-26 ENCOUNTER — Telehealth: Payer: Self-pay | Admitting: *Deleted

## 2016-02-26 NOTE — Telephone Encounter (Signed)
[facility]ed a refill request for Atenolol 25mg -Take 2 tablets by mouth every day.  I do not see this on the pts current medication list and after the phone note on 06/04/2015 the pt called in stating she took it upon herself and is going to take 1/2 of the Atenolol 50mg  that was given.  Patient was a no-show on 2/2.

## 2016-02-27 NOTE — Telephone Encounter (Signed)
I left a detailed message at the pts cell number with the information below. 

## 2016-02-27 NOTE — Telephone Encounter (Signed)
Please call pt.  See what she is taking. Rx 30 days and schedule appt. Thanks.

## 2016-03-01 DIAGNOSIS — Z803 Family history of malignant neoplasm of breast: Secondary | ICD-10-CM | POA: Diagnosis not present

## 2016-03-01 DIAGNOSIS — I10 Essential (primary) hypertension: Secondary | ICD-10-CM | POA: Diagnosis not present

## 2016-03-01 DIAGNOSIS — I341 Nonrheumatic mitral (valve) prolapse: Secondary | ICD-10-CM | POA: Diagnosis not present

## 2016-03-01 DIAGNOSIS — Z0000 Encounter for general adult medical examination without abnormal findings: Secondary | ICD-10-CM | POA: Diagnosis not present

## 2016-04-17 ENCOUNTER — Encounter (HOSPITAL_COMMUNITY): Payer: Self-pay | Admitting: Family Medicine

## 2016-04-17 ENCOUNTER — Ambulatory Visit (HOSPITAL_COMMUNITY)
Admission: EM | Admit: 2016-04-17 | Discharge: 2016-04-17 | Disposition: A | Discharge status: Home / Self Care | Payer: 59 | Attending: Family Medicine | Admitting: Family Medicine

## 2016-04-17 DIAGNOSIS — J0100 Acute maxillary sinusitis, unspecified: Secondary | ICD-10-CM | POA: Diagnosis not present

## 2016-04-17 MED ORDER — AMOXICILLIN 875 MG PO TABS: 875.0000 mg | ORAL_TABLET | Freq: Two times a day (BID) | ORAL | 0 refills | Status: DC

## 2016-04-17 MED ORDER — IPRATROPIUM BROMIDE 0.03 % NA SOLN: 2.0000 | Freq: Two times a day (BID) | NASAL | 0 refills | Status: DC

## 2016-04-17 NOTE — ED Triage Notes (Signed)
Pt stated that she has had nasal congestion and SOB for 3-5 days has taken a zertec unproductive cough and fatigue

## 2016-04-17 NOTE — Discharge Instructions (Signed)
Please return if her symptoms aren't adequately controlled over the next 3 days.

## 2016-04-17 NOTE — ED Provider Notes (Signed)
Janice Gross    CSN: [id number] Arrival date & time: 04/17/16  1411     History   Chief Complaint Chief Complaint  Patient presents with  . Shortness of Breath  . Nasal Congestion  . Cough  . Palpitations    HPI Janice Gross is a 61 y.o. female.   Pt stated that she has had nasal congestion and SOB for 7-10 days has taken a zertec unproductive cough and fatigue. At first she thought it might be the pollen in the air but she has taken Zyrtec and that has not helped.  Patient recently changed her hypertensive medicine from hydrochlorothiazide to chlorthalidone. She's had a few more episodes of palpitations recently.  A works as a Banker for [employer].      Past Medical History:  Diagnosis Date  . Allergic rhinitis   . Anemia   . Anxiety   . Constipation   . GERD (gastroesophageal reflux disease)   . Gestational diabetes   . Headache(784.0)   . IBS (irritable bowel syndrome)   . Mitral valve prolapse   . Neck pain   . Palpitations     Patient Active Problem List   Diagnosis Date Noted  . Mitral valve prolapse 12/03/2015  . Anxiety 05/08/2015  . Essential hypertension 05/08/2015  . Genetic testing 11/23/2013  . Family history of breast cancer in first degree relative 08/09/2012  . Vaginal atrophy 08/09/2012  . Menopause syndrome 08/09/2012  . Dyspepsia and other specified disorders of function of stomach 05/03/2012  . History of neurological disorder 04/04/2012  . Overweight(278.02) 07/05/2011  . Atrophic vaginitis 07/05/2011  . Family history of breast cancer in mother 07/05/2011  . Recurrent yeast infection 07/02/2011  . ANEMIA 12/25/2008  . CONSTIPATION 12/25/2008  . NECK PAIN 12/25/2008  . HEADACHE 12/25/2008  . PALPITATIONS 12/25/2008  . ANXIETY 03/24/2007  . ALLERGIC RHINITIS 03/24/2007  . GERD 03/24/2007  . IRRITABLE BOWEL SYNDROME 03/24/2007  . GESTATIONAL DIABETES 03/24/2007  . MITRAL VALVE PROLAPSE, HX OF 03/24/2007     Past Surgical History:  Procedure Laterality Date  . c section x 1    . COLONOSCOPY  2006   Dr. Isa Rankin   . LAPAROSCOPY    . surgery to remove ovarian cyst      OB History    Gravida Para Term Preterm AB Living   1 1       1    SAB TAB Ectopic Multiple Live Births                   Home Medications    Prior to Admission medications   Medication Sig Start Date End Date Taking? Authorizing Provider  ALPRAZolam Duanne Moron) 0.25 MG tablet 0.25mg  nightly then wean per instructions provided 05/08/15   Terrance Mass, MD  amoxicillin (AMOXIL) 875 MG tablet Take 1 tablet (875 mg total) by mouth 2 (two) times daily. 04/17/16   Robyn Haber, MD  atenolol (TENORMIN) 50 MG tablet Take 1 tablet (50 mg total) by mouth daily. 05/15/15   Lucretia Kern, DO  Calcium Carbonate-Vitamin D (CALTRATE 600+D) 600-400 MG-UNIT per tablet Take 1 tablet by mouth daily.    Historical Provider, MD  cholecalciferol (VITAMIN D) 400 UNITS TABS Take 400 Units by mouth daily.    Historical Provider, MD  fish oil-omega-3 fatty acids 1000 MG capsule Take 2 g by mouth daily.    Historical Provider, MD  hydrochlorothiazide (HYDRODIURIL) 25 MG tablet Take 1 tablet (25 mg  total) by mouth daily. 01/02/16   Lucretia Kern, DO  ipratropium (ATROVENT) 0.03 % nasal spray Place 2 sprays into both nostrils 2 (two) times daily. 04/17/16   Robyn Haber, MD  NONFORMULARY OR COMPOUNDED ITEM Estradiol 0.02 % 33ml prefilled applicator Sig: apply twice a week 05/08/15   Terrance Mass, MD  polyethylene glycol [facility] / GLYCOLAX) packet Take 17 g by mouth daily.    Historical Provider, MD    Family History Family History  Problem Relation Age of Onset  . Breast cancer Sister 14  . Diabetes Sister   . Breast cancer Sister 80  . Diabetes Sister   . Breast cancer Mother   . Ovarian cancer Sister 80  . Heart disease Father   . Diabetes Brother   . Lung cancer Brother   . Breast cancer Other     dx in her 50s/dx in her 36s   . Colon cancer Other 23  . Lung cancer Other     dx in his 68s  . Stomach cancer Neg Hx     Social History Social History  Substance Use Topics  . Smoking status: Never Smoker  . Smokeless tobacco: Never Used     Comment: tried as a teenager  . Alcohol use No     Allergies   Aspirin and Iodine   Review of Systems Review of Systems  Constitutional: Negative.   HENT: Positive for congestion.   Respiratory: Negative for cough.   Cardiovascular: Positive for palpitations. Negative for chest pain.  Musculoskeletal: Negative.   Neurological: Negative.      Physical Exam Triage Vital Signs ED Triage Vitals  Enc Vitals Group     BP      Pulse      Resp      Temp      Temp src      SpO2      Weight      Height      Head Circumference      Peak Flow      Pain Score      Pain Loc      Pain Edu?      Excl. in [place]?    No data found.   Updated Vital Signs BP 117/77 (BP Location: Right Arm)   Pulse 66   Temp 98.4 F (36.9 C) (Oral)   SpO2 97%    Physical Exam  Constitutional: She is oriented to person, place, and time. She appears well-developed and well-nourished.  HENT:  Right Ear: External ear normal.  Left Ear: External ear normal.  Mouth/Throat: Oropharynx is clear and moist.  Eyes: Conjunctivae and EOM are normal. Pupils are equal, round, and reactive to light.  Neck: Normal range of motion. Neck supple.  Cardiovascular: Normal rate, regular rhythm and normal heart sounds.   Pulmonary/Chest: Effort normal and breath sounds normal.  Musculoskeletal: Normal range of motion.  Neurological: She is alert and oriented to person, place, and time.  Skin: Skin is warm and dry.  Nursing note and vitals reviewed.    UC Treatments / Results  Labs (all labs ordered are listed, but only abnormal results are displayed) Labs Reviewed - No data to display  EKG  EKG Interpretation None       Radiology No results found.  Procedures Procedures  (including critical care time)  Medications Ordered in UC Medications - No data to display   Initial Impression / Assessment and Plan / UC Course  I  have reviewed the triage vital signs and the nursing notes.  Pertinent labs & imaging results that were available during my care of the patient were reviewed by me and considered in my medical decision making (see chart for details).     Final Clinical Impressions(s) / UC Diagnoses   Final diagnoses:  Acute maxillary sinusitis, recurrence not specified    New Prescriptions New Prescriptions   AMOXICILLIN (AMOXIL) 875 MG TABLET    Take 1 tablet (875 mg total) by mouth 2 (two) times daily.   IPRATROPIUM (ATROVENT) 0.03 % NASAL SPRAY    Place 2 sprays into both nostrils 2 (two) times daily.     Robyn Haber, MD 04/17/16 1531

## 2016-05-14 ENCOUNTER — Ambulatory Visit (INDEPENDENT_AMBULATORY_CARE_PROVIDER_SITE_OTHER): Payer: 59 | Admitting: Gynecology

## 2016-05-14 ENCOUNTER — Encounter: Payer: Self-pay | Admitting: Gynecology

## 2016-05-14 VITALS — BP 128/84 | Ht 61.0 in | Wt 140.4 lb

## 2016-05-14 DIAGNOSIS — Z79890 Hormone replacement therapy (postmenopausal): Secondary | ICD-10-CM

## 2016-05-14 DIAGNOSIS — Z01419 Encounter for gynecological examination (general) (routine) without abnormal findings: Secondary | ICD-10-CM | POA: Diagnosis not present

## 2016-05-14 DIAGNOSIS — Z780 Asymptomatic menopausal state: Secondary | ICD-10-CM | POA: Diagnosis not present

## 2016-05-14 DIAGNOSIS — F419 Anxiety disorder, unspecified: Secondary | ICD-10-CM | POA: Diagnosis not present

## 2016-05-14 MED ORDER — ALPRAZOLAM 0.25 MG PO TABS
ORAL_TABLET | ORAL | 3 refills | Status: DC
Start: 2016-05-14 — End: 2021-09-22

## 2016-05-14 MED ORDER — NONFORMULARY OR COMPOUNDED ITEM: 5 refills | Status: DC

## 2016-05-17 ENCOUNTER — Other Ambulatory Visit: Payer: Self-pay | Admitting: Gynecology

## 2016-05-17 DIAGNOSIS — Z1231 Encounter for screening mammogram for malignant neoplasm of breast: Secondary | ICD-10-CM

## 2016-05-17 NOTE — Progress Notes (Signed)
Janice Gross Apr 29, 1955 [id number]   History:    61 y.o.  for annual gyn exam with no complaints today. Patient was last seen the office last year. Patient had seen the genetic counselors a result of her family history of breast cancer as follows:   Mother history of breast cancer  3 sisters with breast cancer  2 nieces with breast cancer  Patient stated her genetic testing was all negative to include BRCA1 and BRCA2 testing  Her PCP is Dr. Laurann Montana who is been doing her blood work. Patient's that she had normal colonoscopy in 2015. Her last bone density study was normal in 2015. She takes Xanax on a when necessary basis 0.25 mg and also takes vaginal estrogen twice a week for her vaginal atrophy and has done well before she no vaginal bleeding. Patient with no past history of abnormal Pap smears.  Past medical history,surgical history, family history and social history were all reviewed and documented in the EPIC chart.  Gynecologic History No LMP recorded. Patient is postmenopausal. Contraception: post menopausal status Last Pap: 2017. Results were: normal Last mammogram: 2017. Results were: normal  Obstetric History OB History  Gravida Para Term Preterm AB Living  1 1       1   SAB TAB Ectopic Multiple Live Births               # Outcome Date GA Lbr Len/2nd Weight Sex Delivery Anes PTL Lv  1 Para                ROS: A ROS was performed and pertinent positives and negatives are included in the history.  GENERAL: No fevers or chills. HEENT: No change in vision, no earache, sore throat or sinus congestion. NECK: No pain or stiffness. CARDIOVASCULAR: No chest pain or pressure. No palpitations. PULMONARY: No shortness of breath, cough or wheeze. GASTROINTESTINAL: No abdominal pain, nausea, vomiting or diarrhea, melena or bright red blood per rectum. GENITOURINARY: No urinary frequency, urgency, hesitancy or dysuria. MUSCULOSKELETAL: No joint or muscle pain, no back pain,  no recent trauma. DERMATOLOGIC: No rash, no itching, no lesions. ENDOCRINE: No polyuria, polydipsia, no heat or cold intolerance. No recent change in weight. HEMATOLOGICAL: No anemia or easy bruising or bleeding. NEUROLOGIC: No headache, seizures, numbness, tingling or weakness. PSYCHIATRIC: No depression, no loss of interest in normal activity or change in sleep pattern.     Exam: chaperone present  BP 128/84   Ht 5' 1"  (1.549 m)   Wt 140 lb 6.4 oz (63.7 kg)   BMI 26.53 kg/m   Body mass index is 26.53 kg/m.  General appearance : Well developed well nourished female. No acute distress HEENT: Eyes: no retinal hemorrhage or exudates,  Neck supple, trachea midline, no carotid bruits, no thyroidmegaly Lungs: Clear to auscultation, no rhonchi or wheezes, or rib retractions  Heart: Regular rate and rhythm, no murmurs or gallops Breast:Examined in sitting and supine position were symmetrical in appearance, no palpable masses or tenderness,  no skin retraction, no nipple inversion, no nipple discharge, no skin discoloration, no axillary or supraclavicular lymphadenopathy Abdomen: no palpable masses or tenderness, no rebound or guarding Extremities: no edema or skin discoloration or tenderness  Pelvic:  Bartholin, Urethra, Skene Glands: Within normal limits             Vagina: No gross lesions or discharge, Atrophic changes  Cervix: No gross lesions or discharge  Uterus  anteverted, normal size, shape and consistency, non-tender and  mobile  Adnexa  Without masses or tenderness  Anus and perineum  normal   Rectovaginal  normal sphincter tone without palpated masses or tenderness             Hemoccult cards provided     Assessment/Plan:  61 y.o. female for annual exam doing well with vaginal estrogen twice weekly reports no vaginal bleeding. Patient will be scheduling her mammogram and bone density study for this year. We discussed importance of calcium vitamin D and weightbearing exercises  for osteoporosis prevention. Pap smear not indicated this year. PCP has been doing her blood work.  Terrance Mass MD, 7:55 AM 05/17/2016

## 2016-05-19 ENCOUNTER — Encounter: Payer: Self-pay | Admitting: Gynecology

## 2016-06-11 DIAGNOSIS — I10 Essential (primary) hypertension: Secondary | ICD-10-CM | POA: Diagnosis not present

## 2016-06-18 ENCOUNTER — Ambulatory Visit
Admission: RE | Admit: 2016-06-18 | Discharge: 2016-06-18 | Disposition: A | Discharge status: Home / Self Care | Payer: 59 | Source: Ambulatory Visit | Attending: Gynecology | Admitting: Gynecology

## 2016-06-18 DIAGNOSIS — Z1231 Encounter for screening mammogram for malignant neoplasm of breast: Secondary | ICD-10-CM

## 2016-10-29 ENCOUNTER — Ambulatory Visit (HOSPITAL_COMMUNITY): Admission: EM | Admit: 2016-10-29 | Discharge: 2016-10-29 | Discharge status: Absent without leave | Payer: 59

## 2016-10-29 ENCOUNTER — Emergency Department (HOSPITAL_COMMUNITY)
Admission: EM | Admit: 2016-10-29 | Discharge: 2016-10-30 | Disposition: A | Discharge status: Home / Self Care | Payer: BLUE CROSS/BLUE SHIELD | Attending: Emergency Medicine | Admitting: Emergency Medicine

## 2016-10-29 ENCOUNTER — Encounter (HOSPITAL_COMMUNITY): Payer: Self-pay | Admitting: *Deleted

## 2016-10-29 ENCOUNTER — Emergency Department (HOSPITAL_COMMUNITY): Payer: BLUE CROSS/BLUE SHIELD

## 2016-10-29 ENCOUNTER — Ambulatory Visit (HOSPITAL_COMMUNITY)
Admission: EM | Admit: 2016-10-29 | Discharge: 2016-10-29 | Disposition: A | Discharge status: Home / Self Care | Payer: BLUE CROSS/BLUE SHIELD | Source: Home / Self Care | Attending: Emergency Medicine | Admitting: Emergency Medicine

## 2016-10-29 ENCOUNTER — Encounter (HOSPITAL_COMMUNITY): Payer: Self-pay | Admitting: Emergency Medicine

## 2016-10-29 DIAGNOSIS — K6289 Other specified diseases of anus and rectum: Secondary | ICD-10-CM | POA: Diagnosis present

## 2016-10-29 DIAGNOSIS — K610 Anal abscess: Secondary | ICD-10-CM

## 2016-10-29 DIAGNOSIS — K611 Rectal abscess: Secondary | ICD-10-CM | POA: Diagnosis not present

## 2016-10-29 DIAGNOSIS — Z79899 Other long term (current) drug therapy: Secondary | ICD-10-CM | POA: Diagnosis not present

## 2016-10-29 DIAGNOSIS — F17210 Nicotine dependence, cigarettes, uncomplicated: Secondary | ICD-10-CM | POA: Insufficient documentation

## 2016-10-29 DIAGNOSIS — L0291 Cutaneous abscess, unspecified: Secondary | ICD-10-CM

## 2016-10-29 DIAGNOSIS — R5381 Other malaise: Secondary | ICD-10-CM | POA: Diagnosis not present

## 2016-10-29 LAB — COMPREHENSIVE METABOLIC PANEL
ALBUMIN: 3.7 g/dL (ref 3.5–5.0)
ALT: 21 U/L (ref 14–54)
ANION GAP: 8 (ref 5–15)
AST: 18 U/L (ref 15–41)
Alkaline Phosphatase: 107 U/L (ref 38–126)
BILIRUBIN TOTAL: 0.4 mg/dL (ref 0.3–1.2)
BUN: 8 mg/dL (ref 6–20)
CALCIUM: 9.2 mg/dL (ref 8.9–10.3)
CO2: 28 mmol/L (ref 22–32)
Chloride: 98 mmol/L — ABNORMAL LOW (ref 101–111)
Creatinine, Ser: 0.63 mg/dL (ref 0.44–1.00)
Glucose, Bld: 98 mg/dL (ref 65–99)
POTASSIUM: 3.8 mmol/L (ref 3.5–5.1)
Sodium: 134 mmol/L — ABNORMAL LOW (ref 135–145)
TOTAL PROTEIN: 6.6 g/dL (ref 6.5–8.1)

## 2016-10-29 LAB — CBC WITH DIFFERENTIAL/PLATELET
BASOS ABS: 0 10*3/uL (ref 0.0–0.1)
BASOS PCT: 0 %
EOS ABS: 0 10*3/uL (ref 0.0–0.7)
Eosinophils Relative: 0 %
HEMATOCRIT: 38.6 % (ref 36.0–46.0)
HEMOGLOBIN: 13 g/dL (ref 12.0–15.0)
Lymphocytes Relative: 13 %
Lymphs Abs: 1.4 10*3/uL (ref 0.7–4.0)
MCH: 31.4 pg (ref 26.0–34.0)
MCHC: 33.7 g/dL (ref 30.0–36.0)
MCV: 93.2 fL (ref 78.0–100.0)
Monocytes Absolute: 0.9 10*3/uL (ref 0.1–1.0)
Monocytes Relative: 9 %
NEUTROS ABS: 7.9 10*3/uL — AB (ref 1.7–7.7)
NEUTROS PCT: 78 %
Platelets: 245 10*3/uL (ref 150–400)
RBC: 4.14 MIL/uL (ref 3.87–5.11)
RDW: 12.3 % (ref 11.5–15.5)
WBC: 10.2 10*3/uL (ref 4.0–10.5)

## 2016-10-29 LAB — I-STAT CG4 LACTIC ACID, ED
LACTIC ACID, VENOUS: 0.74 mmol/L (ref 0.5–1.9)
LACTIC ACID, VENOUS: 0.87 mmol/L (ref 0.5–1.9)

## 2016-10-29 LAB — PROTIME-INR
INR: 0.97
PROTHROMBIN TIME: 12.8 s (ref 11.4–15.2)

## 2016-10-29 MED ORDER — CIPROFLOXACIN IN D5W 400 MG/200ML IV SOLN
400.0000 mg | Freq: Once | INTRAVENOUS | Status: AC
  Administered 2016-10-29: 400 mg via INTRAVENOUS
  Filled 2016-10-29: qty 200

## 2016-10-29 MED ORDER — HYDROMORPHONE HCL 1 MG/ML IJ SOLN
1.0000 mg | Freq: Once | INTRAMUSCULAR | Status: AC
  Administered 2016-10-29: 1 mg via INTRAVENOUS
  Filled 2016-10-29: qty 1

## 2016-10-29 MED ORDER — LIDOCAINE HCL (PF) 1 % IJ SOLN
5.0000 mL | Freq: Once | INTRAMUSCULAR | Status: AC
  Administered 2016-10-29: 5 mL
  Filled 2016-10-29: qty 5

## 2016-10-29 MED ORDER — HYDROMORPHONE HCL 1 MG/ML IJ SOLN
0.5000 mg | Freq: Once | INTRAMUSCULAR | Status: AC
  Administered 2016-10-29: 0.5 mg via INTRAVENOUS
  Filled 2016-10-29: qty 1

## 2016-10-29 MED ORDER — CIPROFLOXACIN HCL 500 MG PO TABS: 500.0000 mg | ORAL_TABLET | Freq: Two times a day (BID) | ORAL | 0 refills | Status: AC

## 2016-10-29 MED ORDER — FLUCONAZOLE 200 MG PO TABS: 200.0000 mg | ORAL_TABLET | Freq: Once | ORAL | 0 refills | Status: AC

## 2016-10-29 MED ORDER — METRONIDAZOLE IN NACL 5-0.79 MG/ML-% IV SOLN
500.0000 mg | Freq: Once | INTRAVENOUS | Status: AC
  Administered 2016-10-29: 500 mg via INTRAVENOUS
  Filled 2016-10-29: qty 100

## 2016-10-29 MED ORDER — IOPAMIDOL (ISOVUE-300) INJECTION 61%
INTRAVENOUS | Status: AC
  Administered 2016-10-29: 100 mL
  Filled 2016-10-29: qty 100

## 2016-10-29 MED ORDER — METRONIDAZOLE 500 MG PO TABS: 500.0000 mg | ORAL_TABLET | Freq: Two times a day (BID) | ORAL | 0 refills | Status: AC

## 2016-10-29 MED ORDER — SODIUM CHLORIDE 0.9 % IV BOLUS (SEPSIS)
1000.0000 mL | Freq: Once | INTRAVENOUS | Status: AC
  Administered 2016-10-29: 1000 mL via INTRAVENOUS

## 2016-10-29 NOTE — ED Provider Notes (Signed)
[facility] Provider Note   CSN: [id number] Arrival date & time: 10/29/16  1137 History   Chief Complaint Chief Complaint  Patient presents with  . Rectal Pain  . Fever   HPI Brandy Lowe is a 61 y.o. female with PMH of hemorrhoids who presented with rectal pain. The patient states that approximately 2 days ago she started to have fever, chills, and malaise. She was started on Tamiflu by here PCP. The patient states that the follow day she started to notice discomfort and swelling in her rectum. The pain became severe this morning and she went to urgent care to be evaluated. She was sent to the ED for further evaluation due to concern for peri-rectal abscess. She states that nothing makes the pain better, bowel movement make the pain worse. Associated symptoms include fever and chills. She has taken tylenol at home for the fever.    The history is provided by the patient.   History reviewed. No pertinent past medical history.  There are no active problems to display for this patient.  History reviewed. No pertinent surgical history.  OB History    No data available     Home Medications    Prior to Admission medications   Medication Sig Start Date End Date Taking? Authorizing Provider  aspirin-sod bicarb-citric acid (ALKA-SELTZER) 325 MG TBEF tablet Take 325 mg by mouth every 6 (six) hours as needed. Cold symptoms   Yes [provider]  ibuprofen (ADVIL,MOTRIN) 200 MG tablet Take 200 mg by mouth every 6 (six) hours as needed for mild pain.   Yes [provider]  oseltamivir (TAMIFLU) 30 MG capsule Take 30 mg by mouth.   Yes [provider]  venlafaxine (EFFEXOR) 37.5 MG tablet Take 37.5 mg by mouth 2 (two) times daily.   Yes [provider]   Family History History reviewed. No pertinent family history.  Social History Social History  Substance Use Topics  . Smoking status: Current Every Day  Smoker    Packs/day: 1.00    Types: Cigarettes  . Smokeless tobacco: Never Used  . Alcohol use No   Allergies   Codeine; Anzemet [dolasetron]; Epinephrine; Hydrocodone; Toradol [ketorolac tromethamine]; and Valium [diazepam]  Review of Systems Review of Systems  Constitutional: Negative for chills and fever.  HENT: Negative for ear pain and sore throat.   Eyes: Negative for pain and visual disturbance.  Respiratory: Negative for cough and shortness of breath.   Cardiovascular: Negative for chest pain and palpitations.  Gastrointestinal: Positive for rectal pain. Negative for abdominal pain, blood in stool, diarrhea, nausea and vomiting.  Genitourinary: Negative for dysuria and hematuria.  Musculoskeletal: Negative for arthralgias and back pain.  Skin: Negative for color change and rash.  Neurological: Negative for seizures and syncope.  All other systems reviewed and are negative.  Physical Exam Updated Vital Signs BP 121/65   Pulse 83   Temp 98.7 F (37.1 C) (Oral)   Resp 16   SpO2 92%   Physical Exam  Constitutional: She is oriented to person, place, and time. She appears well-developed and well-nourished. No distress.  HENT:  Head: Normocephalic and atraumatic.  Eyes: Conjunctivae are normal.  Neck: Neck supple.  Cardiovascular: Normal rate and regular rhythm.   No murmur heard. Pulmonary/Chest: Effort normal and breath sounds normal. No respiratory distress.  Abdominal: Soft. There is no tenderness.  Genitourinary: Rectal exam shows mass (area of erythema, with induration an fluctuance consistent with abscess).  Musculoskeletal: She exhibits no edema.  Neurological: She is alert and oriented to person, place, and time.  Skin: Skin is warm and dry.  Psychiatric: She has a normal mood and affect.  Nursing note and vitals reviewed.  ED Treatments / Results  Labs (all labs ordered are listed, but only abnormal results are displayed) Labs Reviewed  COMPREHENSIVE  METABOLIC PANEL - Abnormal; Notable for the following:       Result Value   Sodium 134 (*)    Chloride 98 (*)    All other components within normal limits  CBC WITH DIFFERENTIAL/PLATELET - Abnormal; Notable for the following:    Neutro Abs 7.9 (*)    All other components within normal limits  PROTIME-INR  I-STAT CG4 LACTIC ACID, ED  I-STAT CG4 LACTIC ACID, ED    EKG  EKG Interpretation None       Radiology Ct Abdomen Pelvis W Contrast  Result Date: 10/29/2016 CLINICAL DATA:  Frontal abscess with fever and chills for the past 3 days. History of hemorrhoids. EXAM: CT ABDOMEN AND PELVIS WITH CONTRAST TECHNIQUE: Multidetector CT imaging of the abdomen and pelvis was performed using the standard protocol following bolus administration of intravenous contrast. CONTRAST:  ISOVUE-300 IOPAMIDOL (ISOVUE-300) INJECTION 61% COMPARISON:  None. FINDINGS: Lower chest: Minimal bilateral dependent atelectasis or scarring. Hepatobiliary: No focal liver abnormality is seen. No gallstones, gallbladder wall thickening, or biliary dilatation. Pancreas: Unremarkable. No pancreatic ductal dilatation or surrounding inflammatory changes. Spleen: Normal in size without focal abnormality. Adrenals/Urinary Tract: Small right renal cyst. 3 mm lower pole left renal calculus. No bladder or ureteral calculi and no hydronephrosis. Normal appearing adrenal glands. Stomach/Bowel: Stomach is within normal limits. Appendix appears normal. No evidence of bowel wall thickening, distention, or inflammatory changes. Vascular/Lymphatic: Atheromatous arterial calcifications without aneurysm. No enlarged lymph nodes. Reproductive: Status post hysterectomy. No adnexal masses. Other: There is a right perianal fluid collection with mild surrounding soft tissue enhancement and a small amount of gas within the fluid collection. The fluid collection measures 2.5 x 1.9 cm on image number 88 of series 3. This measures 3.0 cm in length on  coronal image number 88. Also noted is a probable adjacent external hemorrhoid. Musculoskeletal: Mild lumbar and lower thoracic spine degenerative changes and mild scoliosis. IMPRESSION: 1. 3.0 x 2.5 x 1.9 cm right perianal abscess. This contains a small amount of gas, suspicious for infection with a gas-forming organism. 2. 3 mm nonobstructing lower pole left renal calculus. 3. Probable external hemorrhoid. Electronically Signed   By: Beckie Salts M.D.   On: 10/29/2016 18:14   Procedures .Marland KitchenIncision and Drainage Date/Time: 10/30/2016 11:56 PM Performed by: Lamont Snowball Authorized by: Lamont Snowball   Consent:    Consent given by:  Patient   Risks discussed:  Bleeding and pain   Alternatives discussed:  No treatment and delayed treatment Universal protocol:    Procedure explained and questions answered to patient or proxy's satisfaction: yes     Imaging studies available: yes     Patient identity confirmed:  Verbally with patient Location:    Type:  Abscess   Location:  Anogenital   Anogenital location:  Perirectal Pre-procedure details:    Skin preparation:  Betadine Anesthesia (see MAR for exact dosages):    Anesthesia method:  Local infiltration   Local anesthetic:  Lidocaine 1% w/o epi Procedure type:    Complexity:  Simple Procedure details:    Needle aspiration: no     Incision types:  Single straight  Incision depth:  Dermal   Scalpel blade:  11   Wound management:  Probed and deloculated, irrigated with saline and extensive cleaning   Drainage:  Bloody and purulent   Drainage amount:  Copious   Wound treatment:  Wound left open   Packing materials:  1/4 in iodoform gauze   Amount 1/4" iodoform:  5cm Post-procedure details:    Patient tolerance of procedure:  Tolerated well, no immediate complications   (including critical care time)  Medications Ordered in ED Medications  sodium chloride 0.9 % bolus 1,000 mL (1,000 mLs Intravenous New Bag/Given 10/29/16 1702)    HYDROmorphone (DILAUDID) injection 0.5 mg (0.5 mg Intravenous Given 10/29/16 1702)  iopamidol (ISOVUE-300) 61 % injection (100 mLs  Contrast Given 10/29/16 1749)   Initial Impression / Assessment and Plan / ED Course  I have reviewed the triage vital signs and the nursing notes.  Pertinent labs & imaging results that were available during my care of the patient were reviewed by me and considered in my medical decision making (see chart for details).  Brandy Lowe is a 62 y.o. female who present with signs and symptoms concerning for a perirectal abscess.  Labs studies that were done at urgent care were reviewed: CBC wnl, CMP wnl, all other labs unremarkable   CT abd/pelvis ordered to evaluated depth and extension of abscess.  CT revealed: 3.0 x 2.5 x 1.9 cm right perianal abscess  The paitent was given antibiotics ciprofloxacin and flagy Abscess lanced in the ED. Patient received IV pain medication and local anesthetic.   Discharged in stable condition. Given Rx for ciprofloxacin and flagy. Givens strict return precaution, advised to follow up in surgery clinic Monday morning, (approx 3 days).  Final Clinical Impressions(s) / ED Diagnoses   Final diagnoses:  Rectal abscess   New Prescriptions New Prescriptions   No medications on file     Lamont Snowball, MD 10/31/16 France Ravens, MD 11/01/16 [phone]

## 2016-10-29 NOTE — Discharge Instructions (Signed)
I am concerned that you have a perirectal mass because of the fever and the tender internal mass on rectal exam.

## 2016-10-29 NOTE — ED Triage Notes (Signed)
Pt c/o rectal abscess onset 3 days.... Reports it started out w/chills and fevers  Hx of hemorrhoids  Sx today include swelling   Denies drainage  A&O x4... NAD... Ambulatory

## 2016-10-29 NOTE — ED Provider Notes (Signed)
HPI  SUBJECTIVE:  Brandy Lowe is a 61 y.o. female who presents with "perirectal abscess" she reports chills, fevers T-max 102, nausea, decreased appetite starting 3 days ago.  She has had an intermittent right sided rectal mass for some time but it became acutely larger and more painful yesterday.  She describes the pain as constant, achy.  She denies any drainage.  She states that she has not had a bowel movement in 2 days, but has not had the urge to defecate.  She initially was thought to have influenza, was started on Tamiflu.  She is also taking Alka-Seltzer flu with fever reduction.  Last antipyretic was 1 hour prior to arrival.  Symptoms are worse with sitting and walking.  She has a past medical history of IBS.  No history of diabetes, hypertension, HIV, immunocompromised, perirectal abscess, MRSA.  She is a Engineer, civil (consulting).  PMD: None.  History reviewed. No pertinent past medical history.  History reviewed. No pertinent surgical history.  History reviewed. No pertinent family history.  Social History  Substance Use Topics  . Smoking status: Current Every Day Smoker    Packs/day: 1.00    Types: Cigarettes  . Smokeless tobacco: Never Used  . Alcohol use No    No current facility-administered medications for this encounter.   Current Outpatient Prescriptions:  .  oseltamivir (TAMIFLU) 30 MG capsule, Take 30 mg by mouth., Disp: , Rfl:  .  venlafaxine (EFFEXOR) 37.5 MG tablet, Take 37.5 mg by mouth 2 (two) times daily., Disp: , Rfl:   Allergies  Allergen Reactions  . Codeine Anaphylaxis  . Anzemet [Dolasetron] Swelling  . Epinephrine     Extreme jittery  . Hydrocodone Swelling    Rash and swelling  . Toradol [Ketorolac Tromethamine] Rash  . Valium [Diazepam] Rash     ROS  As noted in HPI.   Physical Exam  BP 125/74 (BP Location: Left Arm)   Pulse (!) 110   Temp 98.9 F (37.2 C) (Oral)   Resp 20   SpO2 97%   Constitutional: Well developed, well nourished, no acute  distress Eyes:  EOMI, conjunctiva normal bilaterally HENT: Normocephalic, atraumatic,mucus membranes moist Respiratory: Normal inspiratory effort Cardiovascular: Normal rate GI: nondistended GU: Positive nontender large hemorrhoid R side. tender erythema, fluctuance immediately inferior to this.  Positive swelling, tenderness along rectal wall on digital rectal exam in this area.  No expressible purulent drainage. skin: No rash, skin intact Musculoskeletal: no deformities Neurologic: Alert & oriented x 3, no focal neuro deficits Psychiatric: Speech and behavior appropriate   ED Course   Medications - No data to display  No orders of the defined types were placed in this encounter.   No results found for this or any previous visit (from the past 24 hour(s)). No results found.  ED Clinical Impression  Abscess   ED Assessment/Plan  Concern for perirectal mass given the history of fever, tachycardia and the tender fluctuance on digital rectal exam.  She is afebrile here but she did take an antipyretic 1 hour prior to evaluation.  Transferring to the ED for comprehensive workup.  She agrees with plan.    Discussed MDM, rationale for transfer to the ED with patient. patient agrees with plan.   Meds ordered this encounter  Medications  . venlafaxine (EFFEXOR) 37.5 MG tablet    Sig: Take 37.5 mg by mouth 2 (two) times daily.  Marland Kitchen oseltamivir (TAMIFLU) 30 MG capsule    Sig: Take 30 mg by mouth.    *  This clinic note was created using Scientist, clinical (histocompatibility and immunogenetics)Dragon dictation software. Therefore, there may be occasional mistakes despite careful proofreading.   ?    Domenick GongMortenson, Eilah Common, MD 10/29/16 1137

## 2016-10-29 NOTE — ED Triage Notes (Signed)
Pt sent here from ucc due to rectal pain and fever, probable perirectal abscess.

## 2016-10-29 NOTE — ED Notes (Signed)
Patient transported to CT 

## 2016-10-30 NOTE — ED Notes (Signed)
0.5mg  dilaudid wasted in sink with Rosebud PolesKaitlynn Pruitt, RN after patient was discharged out of [facility].

## 2017-01-06 ENCOUNTER — Encounter (HOSPITAL_COMMUNITY): Payer: Self-pay | Admitting: Emergency Medicine

## 2017-01-06 ENCOUNTER — Other Ambulatory Visit: Payer: Self-pay

## 2017-01-06 ENCOUNTER — Emergency Department (HOSPITAL_COMMUNITY)
Admission: EM | Admit: 2017-01-06 | Discharge: 2017-01-06 | Disposition: A | Discharge status: Home / Self Care | Payer: BLUE CROSS/BLUE SHIELD | Attending: Emergency Medicine | Admitting: Emergency Medicine

## 2017-01-06 ENCOUNTER — Emergency Department (HOSPITAL_COMMUNITY): Payer: BLUE CROSS/BLUE SHIELD

## 2017-01-06 DIAGNOSIS — Z79899 Other long term (current) drug therapy: Secondary | ICD-10-CM | POA: Diagnosis not present

## 2017-01-06 DIAGNOSIS — K610 Anal abscess: Secondary | ICD-10-CM | POA: Diagnosis not present

## 2017-01-06 DIAGNOSIS — F17210 Nicotine dependence, cigarettes, uncomplicated: Secondary | ICD-10-CM | POA: Diagnosis not present

## 2017-01-06 DIAGNOSIS — R600 Localized edema: Secondary | ICD-10-CM | POA: Diagnosis present

## 2017-01-06 LAB — COMPREHENSIVE METABOLIC PANEL
ALK PHOS: 86 U/L (ref 38–126)
ALT: 10 U/L — ABNORMAL LOW (ref 14–54)
ANION GAP: 5 (ref 5–15)
AST: 13 U/L — ABNORMAL LOW (ref 15–41)
Albumin: 3.7 g/dL (ref 3.5–5.0)
BILIRUBIN TOTAL: 0.5 mg/dL (ref 0.3–1.2)
BUN: 6 mg/dL (ref 6–20)
CALCIUM: 9.3 mg/dL (ref 8.9–10.3)
CO2: 24 mmol/L (ref 22–32)
Chloride: 108 mmol/L (ref 101–111)
Creatinine, Ser: 0.69 mg/dL (ref 0.44–1.00)
GFR calc non Af Amer: >60 mL/min (ref >60)
Glucose, Bld: 97 mg/dL (ref 65–99)
Potassium: 3.5 mmol/L (ref 3.5–5.1)
SODIUM: 137 mmol/L (ref 135–145)
TOTAL PROTEIN: 6.4 g/dL — AB (ref 6.5–8.1)

## 2017-01-06 LAB — CBC WITH DIFFERENTIAL/PLATELET
BASOS ABS: 0 10*3/uL (ref 0.0–0.1)
BASOS PCT: 0 %
Eosinophils Absolute: 0 10*3/uL (ref 0.0–0.7)
Eosinophils Relative: 1 %
HEMATOCRIT: 38.9 % (ref 36.0–46.0)
HEMOGLOBIN: 12.6 g/dL (ref 12.0–15.0)
Lymphocytes Relative: 19 %
Lymphs Abs: 1.3 10*3/uL (ref 0.7–4.0)
MCH: 30.5 pg (ref 26.0–34.0)
MCHC: 32.4 g/dL (ref 30.0–36.0)
MCV: 94.2 fL (ref 78.0–100.0)
Monocytes Absolute: 0.6 10*3/uL (ref 0.1–1.0)
Monocytes Relative: 8 %
NEUTROS ABS: 5.1 10*3/uL (ref 1.7–7.7)
NEUTROS PCT: 72 %
Platelets: 261 10*3/uL (ref 150–400)
RBC: 4.13 MIL/uL (ref 3.87–5.11)
RDW: 13.2 % (ref 11.5–15.5)
WBC: 7.1 10*3/uL (ref 4.0–10.5)

## 2017-01-06 MED ORDER — IOPAMIDOL (ISOVUE-300) INJECTION 61%
INTRAVENOUS | Status: AC
  Administered 2017-01-06: 100 mL
  Filled 2017-01-06: qty 100

## 2017-01-06 MED ORDER — BUPIVACAINE HCL 0.25 % IJ SOLN
10.0000 mL | Freq: Once | INTRAMUSCULAR | Status: AC
  Administered 2017-01-06: 10 mL
  Filled 2017-01-06 (×2): qty 10

## 2017-01-06 MED ORDER — FENTANYL CITRATE (PF) 100 MCG/2ML IJ SOLN
50.0000 ug | Freq: Once | INTRAMUSCULAR | Status: AC
  Administered 2017-01-06: 50 ug via INTRAVENOUS
  Filled 2017-01-06: qty 2

## 2017-01-06 NOTE — ED Notes (Signed)
EDP at bedside performing I&D. 

## 2017-01-06 NOTE — ED Notes (Addendum)
Pt states that she has abscess and is here for CT; pt states that she has been on ABT for day and half; started Augmentin 875 PO BID x 7 days and has only had 3 doses. Pt's peri-rectal area is reddened; tender to touch; area hard. Pt states that pain is 5/10

## 2017-01-06 NOTE — Discharge Instructions (Signed)
Get help right away if: °You have severe pain or bleeding. °You cannot eat or drink without vomiting. °You have decreased urine output. °You become short of breath. °You have chest pain. °You cough up blood. °The area where the incision and drainage occurred becomes numb or it tingles. °

## 2017-01-06 NOTE — ED Notes (Signed)
Patient ambulatory to restroom and back with steady gait.  

## 2017-01-06 NOTE — ED Notes (Signed)
Pt discharged from ED; instructions provided; Pt encouraged to return to ED if symptoms worsen and to f/u with PCP; Pt verbalized understanding of all instructions 

## 2017-01-06 NOTE — ED Provider Notes (Signed)
[facility] EMERGENCY DEPARTMENT Provider Note   CSN: [id number] Arrival date & time: 01/06/17  4098     History   Chief Complaint Chief Complaint  Patient presents with  . Abscess    HPI Brandy Lowe is a 62 y.o. female.  Who presents for evaluation of rectal abscess.  She is a history of a previous perirectal abscess.  Patient states that she was seen in the [place] [facility] office and had an I&D however no purulent drainage was obtained.  She has had worsening pain and swelling over the past day and was sent into the emergency department for CT of the abdomen and pelvis to evaluate the abscess.  She denies fevers or chills.  She has significant pain. HPI  History reviewed. No pertinent past medical history.  There are no active problems to display for this patient.   History reviewed. No pertinent surgical history.  OB History    No data available       Home Medications    Prior to Admission medications   Medication Sig Start Date End Date Taking? Authorizing Provider  amoxicillin-clavulanate (AUGMENTIN) 875-125 MG tablet Take 1 tablet by mouth 2 (two) times daily. 01/05/17 01/12/17 Yes [provider]  ibuprofen (ADVIL,MOTRIN) 200 MG tablet Take 400 mg by mouth every 6 (six) hours as needed for mild pain or moderate pain.    Yes [provider]  venlafaxine (EFFEXOR) 37.5 MG tablet Take 37.5 mg by mouth 2 (two) times daily.   Yes [provider]  ciprofloxacin (CIPRO) 500 MG tablet Take 1 tablet (500 mg total) by mouth 2 (two) times daily. Patient not taking: Reported on 01/06/2017 10/29/16   Lamont Snowball, MD  metroNIDAZOLE (FLAGYL) 500 MG tablet Take 1 tablet (500 mg total) by mouth 2 (two) times daily. Patient not taking: Reported on 01/06/2017 10/29/16   Lamont Snowball, MD    Family History History reviewed. No pertinent family history.  Social History Social History   Tobacco Use  . Smoking status: Current  Every Day Smoker    Packs/day: 1.00    Types: Cigarettes  . Smokeless tobacco: Never Used  Substance Use Topics  . Alcohol use: No  . Drug use: No     Allergies   Codeine; Anzemet [dolasetron]; Deltasone [prednisone]; Epinephrine; Hydrocodone; Toradol [ketorolac tromethamine]; and Valium [diazepam]   Review of Systems Review of Systems  Ten systems reviewed and are negative for acute change, except as noted in the HPI.   Physical Exam Updated Vital Signs BP 119/60 (BP Location: Right Arm)   Pulse 84   Temp 98.4 F (36.9 C) (Oral)   Resp 18   SpO2 97%   Physical Exam  Constitutional: She is oriented to person, place, and time. She appears well-developed and well-nourished. No distress.  HENT:  Head: Normocephalic and atraumatic.  Eyes: Conjunctivae are normal. No scleral icterus.  Neck: Normal range of motion.  Cardiovascular: Normal rate, regular rhythm and normal heart sounds. Exam reveals no gallop and no friction rub.  No murmur heard. Pulmonary/Chest: Effort normal and breath sounds normal. No respiratory distress.  Abdominal: Soft. Bowel sounds are normal. She exhibits no distension and no mass. There is no tenderness. There is no guarding.  Musculoskeletal:  Tender, firm, indurated abscess of the right perianal region  Neurological: She is alert and oriented to person, place, and time.  Skin: Skin is warm and dry. She is not diaphoretic.  Psychiatric: Her behavior is normal.  Nursing note and vitals reviewed.    ED Treatments / Results  Labs (all labs ordered are listed, but only abnormal results are displayed) Labs Reviewed  COMPREHENSIVE METABOLIC PANEL - Abnormal; Notable for the following components:      Result Value   Total Protein 6.4 (*)    AST 13 (*)    ALT 10 (*)    All other components within normal limits  CBC WITH DIFFERENTIAL/PLATELET    EKG  EKG Interpretation None       Radiology Ct Abdomen Pelvis W Contrast  Result Date:  01/06/2017 CLINICAL DATA:  Rectal pain, history of prior perianal abscess EXAM: CT ABDOMEN AND PELVIS WITH CONTRAST TECHNIQUE: Multidetector CT imaging of the abdomen and pelvis was performed using the standard protocol following bolus administration of intravenous contrast. CONTRAST:  ISOVUE-300 IOPAMIDOL (ISOVUE-300) INJECTION 61% COMPARISON:  10/29/2016 FINDINGS: Lower chest: No acute abnormality. Hepatobiliary: No focal liver abnormality is seen. No gallstones, gallbladder wall thickening, or biliary dilatation. Pancreas: Unremarkable. No pancreatic ductal dilatation or surrounding inflammatory changes. Spleen: Normal in size without focal abnormality. Adrenals/Urinary Tract: The adrenal glands are within normal limits. The kidneys demonstrate a normal enhancement pattern bilaterally. Small cortical cyst is noted on the right stable from the prior study. Bladder is well distended. Stomach/Bowel: Stomach is within normal limits. Appendix appears normal. No evidence of bowel wall thickening, distention, or inflammatory changes. Vascular/Lymphatic: Aortic atherosclerosis. No enlarged abdominal or pelvic lymph nodes. Reproductive: Status post hysterectomy. No adnexal masses. Other: A residual/recurrent right perianal abscess is noted. It measures 3.2 by 1.7 cm in greatest dimension. When measured in the sagittal plane the maximum dimension previously was 4.0 cm. The previously seen somewhat bilobed appearance has also reduced in the interval from the prior exam. Musculoskeletal: No acute or significant osseous findings. IMPRESSION: Small right perianal abscess as described. It has decreased in size from the prior exam as described above. It is uncertain whether this represents residual or recurrent disease. Chronic changes as described. Electronically Signed   By: Alcide Clever M.D.   On: 01/06/2017 18:39    Procedures Procedures (including critical care time)  Medications Ordered in ED Medications    fentaNYL (SUBLIMAZE) injection 50 mcg (not administered)  iopamidol (ISOVUE-300) 61 % injection (100 mLs  Contrast Given 01/06/17 1816)   INCISION AND DRAINAGE Performed by: Arthor Captain Consent: Verbal consent obtained. Risks and benefits: risks, benefits and alternatives were discussed Type: abscess  Body area: Perianal  Anesthesia: local infiltration  Incision was made with a scalpel.  Local anesthetic: marcaine 0.25% without epi  Anesthetic total: 6 ml  Complexity: complex Blunt dissection to break up loculations  Drainage: Serosanguinous, minimal purulence  Drainage amount: moderate  Packing material: 1/4 in iodoform gauze  Patient tolerance: Patient tolerated the procedure well with no immediate complications.   Initial Impression / Assessment and Plan / ED Course  I have reviewed the triage vital signs and the nursing notes.  Pertinent labs & imaging results that were available during my care of the patient were reviewed by me and considered in my medical decision making (see chart for details).  Clinical Course as of Jan 08 20  Morey Hummingbird Jan 06, 2017  1610 I spoke with Dr. Lindie Spruce about the patient.  He asked that I attempted I&D here however if I am unable he will come to see the patient.  [AH]    Clinical Course User Index [AH] Arthor Captain, PA-C    Abscess successfully drained.  Cruciate vision placed with packing.  [facility]  She appears safe for discharge at this time, bleeding is controlled.  Final Clinical Impressions(s) / ED Diagnoses   Final diagnoses:  Perianal abscess    ED Discharge Orders    None       Arthor CaptainHarris, Eris Breck, PA-C 01/07/17 0025    Rolan BuccoBelfi, Melanie, MD 01/08/17 0700

## 2017-01-06 NOTE — ED Notes (Signed)
Patient transported to CT 

## 2017-01-06 NOTE — ED Triage Notes (Signed)
Pt to ER for returning perirectal abscess. States occurred 2 months ago and was successful with I/D and antibiotics, did not require surgical intervention. States this abscess has been present for 2 days. States had attempted I/D yesterday in doctors office, states is bigger today and is not resolving. States was sent here for CT and further evaluation

## 2017-02-16 DIAGNOSIS — J069 Acute upper respiratory infection, unspecified: Secondary | ICD-10-CM | POA: Diagnosis not present

## 2017-03-25 DIAGNOSIS — I10 Essential (primary) hypertension: Secondary | ICD-10-CM | POA: Diagnosis not present

## 2017-03-25 DIAGNOSIS — Z0000 Encounter for general adult medical examination without abnormal findings: Secondary | ICD-10-CM | POA: Diagnosis not present

## 2017-04-25 ENCOUNTER — Other Ambulatory Visit: Payer: Self-pay | Admitting: Obstetrics & Gynecology

## 2017-04-25 DIAGNOSIS — Z1231 Encounter for screening mammogram for malignant neoplasm of breast: Secondary | ICD-10-CM

## 2017-05-20 ENCOUNTER — Encounter: Payer: 59 | Admitting: Obstetrics & Gynecology

## 2017-06-09 ENCOUNTER — Encounter: Payer: 59 | Admitting: Obstetrics & Gynecology

## 2017-06-09 DIAGNOSIS — Z0289 Encounter for other administrative examinations: Secondary | ICD-10-CM

## 2017-06-24 ENCOUNTER — Ambulatory Visit
Admission: RE | Admit: 2017-06-24 | Discharge: 2017-06-24 | Disposition: A | Discharge status: Home / Self Care | Payer: 59 | Source: Ambulatory Visit | Attending: Obstetrics & Gynecology | Admitting: Obstetrics & Gynecology

## 2017-06-24 DIAGNOSIS — Z1231 Encounter for screening mammogram for malignant neoplasm of breast: Secondary | ICD-10-CM | POA: Diagnosis not present

## 2017-08-04 DIAGNOSIS — J069 Acute upper respiratory infection, unspecified: Secondary | ICD-10-CM | POA: Diagnosis not present

## 2017-08-04 DIAGNOSIS — N951 Menopausal and female climacteric states: Secondary | ICD-10-CM | POA: Diagnosis not present

## 2017-08-04 DIAGNOSIS — K5900 Constipation, unspecified: Secondary | ICD-10-CM | POA: Diagnosis not present

## 2017-09-22 DIAGNOSIS — I872 Venous insufficiency (chronic) (peripheral): Secondary | ICD-10-CM | POA: Diagnosis not present

## 2017-12-22 ENCOUNTER — Encounter (HOSPITAL_COMMUNITY): Payer: Self-pay | Admitting: Emergency Medicine

## 2017-12-22 ENCOUNTER — Ambulatory Visit (HOSPITAL_COMMUNITY)
Admission: EM | Admit: 2017-12-22 | Discharge: 2017-12-22 | Disposition: A | Discharge status: Home / Self Care | Payer: 59

## 2017-12-22 DIAGNOSIS — B9789 Other viral agents as the cause of diseases classified elsewhere: Secondary | ICD-10-CM | POA: Insufficient documentation

## 2017-12-22 DIAGNOSIS — J069 Acute upper respiratory infection, unspecified: Secondary | ICD-10-CM | POA: Insufficient documentation

## 2017-12-22 MED ORDER — CETIRIZINE HCL 10 MG PO TABS: 10.0000 mg | ORAL_TABLET | Freq: Every day | ORAL | 0 refills | Status: DC

## 2017-12-22 MED ORDER — FLUTICASONE PROPIONATE 50 MCG/ACT NA SUSP: 1.0000 | Freq: Every day | NASAL | 2 refills | Status: DC

## 2017-12-22 MED ORDER — BENZONATATE 100 MG PO CAPS: 100.0000 mg | ORAL_CAPSULE | Freq: Three times a day (TID) | ORAL | 0 refills | Status: DC

## 2017-12-22 NOTE — Discharge Instructions (Signed)
I believe this is a viral upper respiratory infection I have sent some medications to the pharmacy to help with the symptoms.  Follow up as needed for continued or worsening symptoms

## 2017-12-22 NOTE — ED Triage Notes (Signed)
Pt c/o dry unproductive cough, post nasal drip, irritated throat, feeling tired since yesterday.

## 2017-12-26 NOTE — ED Provider Notes (Signed)
[place]    CSN: [id number] Arrival date & time: 12/22/17  1800     History   Chief Complaint Chief Complaint  Patient presents with  . Cough    HPI Janice Gross is a 62 y.o. female.    URI  Presenting symptoms: congestion, cough, rhinorrhea and sore throat   Severity:  Moderate Onset quality:  Sudden Duration:  1 day Timing:  Constant Progression:  Unchanged Chronicity:  New Relieved by:  Nothing Worsened by:  Nothing Ineffective treatments:  OTC medications Associated symptoms: myalgias, sinus pain and sneezing   Risk factors: no immunosuppression, no recent illness, no recent travel and no sick contacts     Past Medical History:  Diagnosis Date  . Allergic rhinitis   . Anemia   . Anxiety   . Constipation   . GERD (gastroesophageal reflux disease)   . Gestational diabetes   . Headache(784.0)   . IBS (irritable bowel syndrome)   . Mitral valve prolapse   . Neck pain   . Palpitations     Patient Active Problem List   Diagnosis Date Noted  . Mitral valve prolapse 12/03/2015  . Anxiety 05/08/2015  . Essential hypertension 05/08/2015  . Genetic testing 11/23/2013  . Family history of breast cancer in first degree relative 08/09/2012  . Vaginal atrophy 08/09/2012  . Menopause syndrome 08/09/2012  . Dyspepsia and other specified disorders of function of stomach 05/03/2012  . History of neurological disorder 04/04/2012  . Overweight(278.02) 07/05/2011  . Atrophic vaginitis 07/05/2011  . Family history of breast cancer in mother 07/05/2011  . Recurrent yeast infection 07/02/2011  . ANEMIA 12/25/2008  . CONSTIPATION 12/25/2008  . NECK PAIN 12/25/2008  . HEADACHE 12/25/2008  . PALPITATIONS 12/25/2008  . ANXIETY 03/24/2007  . ALLERGIC RHINITIS 03/24/2007  . GERD 03/24/2007  . IRRITABLE BOWEL SYNDROME 03/24/2007  . GESTATIONAL DIABETES 03/24/2007  . MITRAL VALVE PROLAPSE, HX OF 03/24/2007    Past Surgical History:  Procedure  Laterality Date  . c section x 1    . COLONOSCOPY  2006   Dr. Isa Rankin   . LAPAROSCOPY    . surgery to remove ovarian cyst      OB History    Gravida  1   Para  1   Term      Preterm      AB      Living  1     SAB      TAB      Ectopic      Multiple      Live Births               Home Medications    Prior to Admission medications   Medication Sig Start Date End Date Taking? Authorizing Provider  atenolol (TENORMIN) 50 MG tablet Take 1 tablet (50 mg total) by mouth daily. 05/15/15  Yes Lucretia Kern, DO  Calcium Carbonate-Vitamin D (CALTRATE 600+D) 600-400 MG-UNIT per tablet Take 1 tablet by mouth daily.   Yes [provider]  chlorothiazide (DIURIL) 250 MG tablet Take 250 mg by mouth every morning.   Yes [provider]  cholecalciferol (VITAMIN D) 400 UNITS TABS Take 400 Units by mouth daily.   Yes [provider]  ALPRAZolam Duanne Moron) 0.25 MG tablet 0.25mg  nightly then wean per instructions provided Patient not taking: Reported on 12/22/2017 05/14/16   Terrance Mass, MD  benzonatate (TESSALON) 100 MG capsule Take 1 capsule (100 mg total) by mouth  every 8 (eight) hours. 12/22/17   Loura Halt A, NP  cetirizine (ZYRTEC) 10 MG tablet Take 1 tablet (10 mg total) by mouth daily. 12/22/17   Loura Halt A, NP  fish oil-omega-3 fatty acids 1000 MG capsule Take 2 g by mouth daily.    [provider]  fluticasone (FLONASE) 50 MCG/ACT nasal spray Place 1 spray into both nostrils daily. 12/22/17   Loura Halt A, NP  hydrochlorothiazide (HYDRODIURIL) 25 MG tablet Take 1 tablet (25 mg total) by mouth daily. Patient not taking: Reported on 12/22/2017 01/02/16   Colin Benton R, DO  ipratropium (ATROVENT) 0.03 % nasal spray Place 2 sprays into both nostrils 2 (two) times daily. Patient not taking: Reported on 12/22/2017 04/17/16   Robyn Haber, MD  NONFORMULARY OR COMPOUNDED ITEM Estradiol 0.02 % 82ml prefilled applicator Sig: apply  twice a week 05/14/16   Terrance Mass, MD  polyethylene glycol [facility] / Floria Raveling) packet Take 17 g by mouth daily.    [provider]    Family History Family History  Problem Relation Age of Onset  . Breast cancer Sister 64  . Diabetes Sister   . Breast cancer Sister 22  . Diabetes Sister   . Breast cancer Mother   . Ovarian cancer Sister 96  . Heart disease Father   . Diabetes Brother   . Lung cancer Brother   . Breast cancer Other        dx in her 50s/dx in her 53s  . Colon cancer Other 23  . Lung cancer Other        dx in his 54s  . Stomach cancer Neg Hx     Social History Social History   Tobacco Use  . Smoking status: Never Smoker  . Smokeless tobacco: Never Used  . Tobacco comment: tried as a teenager  Substance Use Topics  . Alcohol use: No    Alcohol/week: 0.0 standard drinks  . Drug use: No     Allergies   Aspirin and Iodine   Review of Systems Review of Systems  HENT: Positive for congestion, rhinorrhea, sinus pain, sneezing and sore throat.   Respiratory: Positive for cough.   Musculoskeletal: Positive for myalgias.     Physical Exam Triage Vital Signs ED Triage Vitals [12/22/17 1854]  Enc Vitals Group     BP 112/76     Pulse Rate 74     Resp 18     Temp 97.9 F (36.6 C)     Temp src      SpO2 100 %     Weight      Height      Head Circumference      Peak Flow      Pain Score 6     Pain Loc      Pain Edu?      Excl. in [place]?    No data found.  Updated Vital Signs BP 112/76   Pulse 74   Temp 97.9 F (36.6 C)   Resp 18   SpO2 100%   Visual Acuity Right Eye Distance:   Left Eye Distance:   Bilateral Distance:    Right Eye Near:   Left Eye Near:    Bilateral Near:     Physical Exam Vitals signs and nursing note reviewed.  Constitutional:      Appearance: Normal appearance. She is not ill-appearing or toxic-appearing.  HENT:     Head: Normocephalic and atraumatic.     Right  Ear: Tympanic membrane, ear  canal and external ear normal.     Left Ear: Tympanic membrane, ear canal and external ear normal.     Nose: Congestion and rhinorrhea present.     Mouth/Throat:     Pharynx: Oropharynx is clear. Posterior oropharyngeal erythema present.  Eyes:     Conjunctiva/sclera: Conjunctivae normal.  Neck:     Musculoskeletal: Normal range of motion and neck supple.  Cardiovascular:     Rate and Rhythm: Normal rate and regular rhythm.     Heart sounds: Normal heart sounds.  Pulmonary:     Effort: Pulmonary effort is normal.     Breath sounds: Normal breath sounds.  Musculoskeletal: Normal range of motion.  Lymphadenopathy:     Cervical: No cervical adenopathy.  Skin:    General: Skin is warm and dry.  Neurological:     Mental Status: She is alert.  Psychiatric:        Mood and Affect: Mood normal.      UC Treatments / Results  Labs (all labs ordered are listed, but only abnormal results are displayed) Labs Reviewed - No data to display  EKG None  Radiology No results found.  Procedures Procedures (including critical care time)  Medications Ordered in UC Medications - No data to display  Initial Impression / Assessment and Plan / UC Course  I have reviewed the triage vital signs and the nursing notes.  Pertinent labs & imaging results that were available during my care of the patient were reviewed by me and considered in my medical decision making (see chart for details).     Viral URI vs allergic  rhinitis. - symptoms have been present 1 day Flonase, zyrtec and tessalon pearls for symptoms.  Follow up as needed for continued or worsening symptoms  Final Clinical Impressions(s) / UC Diagnoses   Final diagnoses:  Viral URI with cough     Discharge Instructions     I believe this is a viral upper respiratory infection I have sent some medications to the pharmacy to help with the symptoms.  Follow up as needed for continued or worsening symptoms     ED  Prescriptions    Medication Sig Dispense Auth. Provider   cetirizine (ZYRTEC) 10 MG tablet Take 1 tablet (10 mg total) by mouth daily. 30 tablet Hridaan Bouse A, NP   fluticasone (FLONASE) 50 MCG/ACT nasal spray Place 1 spray into both nostrils daily. 16 g Abbigaile Rockman A, NP   benzonatate (TESSALON) 100 MG capsule Take 1 capsule (100 mg total) by mouth every 8 (eight) hours. 21 capsule Orvan July, NP     Controlled Substance Prescriptions [place] Controlled Substance Registry consulted? no   Orvan July, NP 12/26/17 [phone]

## 2017-12-31 DIAGNOSIS — I872 Venous insufficiency (chronic) (peripheral): Secondary | ICD-10-CM | POA: Diagnosis not present

## 2019-02-16 ENCOUNTER — Ambulatory Visit: Payer: Self-pay | Admitting: *Deleted

## 2019-02-16 ENCOUNTER — Telehealth: Payer: Self-pay

## 2019-02-16 NOTE — Telephone Encounter (Signed)
Pt has questions about the Covid vaccine. She has had some allergic reactions to the flu vaccine and wonders if she should get the covid. Will send a message to the nurse to call back.   [place]

## 2019-02-16 NOTE — Telephone Encounter (Signed)
Pt has questions about the Covid vaccine. She has had some allergic reactions to the flu vaccine and wonders if she should get the covid. Please call pt back. Thanks!   Routing comment   Hopkins, Chasta M 12 minutes ago (12:30 PM)  [place]   Pt has questions about the Covid vaccine. She has had some allergic reactions to the flu vaccine and wonders if she should get the covid. Will send a message to the nurse to call back.   [facility]     Call to patient- she is concerned about getting the vaccine due to her past history- Patient states years ago she has flu vaccine and was hospitalized for 2 weeks after the injection due to numbness in feet/legs.  Patient states it was never stated the cause of her symptoms- but she was given steriod treatment in hospital and has not taken flu vaccine since. Patient does how ever state she did have pneumonia vaccine a since without problem. Advised due to her history- she should have conversation with PCP about risk vs benefit and get some guidance on getting COVID vaccine. She will contact her pCP to discuss. Reason for Disposition . COVID-19 vaccine, Frequently Asked Questions (FAQs)  Answer Assessment - Initial Assessment Questions 1. MAIN CONCERN OR SYMPTOM:  "What is your main concern right now?" "What question do you have?" "What's the main symptom you're worried about?" (e.g., fever, pain, redness, swelling)     Vaccine reaction in the past 2. VACCINE: "What vaccination did you receive?" "Is this your first or second shot?" (e.g., none; [place], other)     None at this tme 3. SYMPTOM ONSET: "When did the  begin?" (e.g., not relevant; hours, days)      n/a 4. SYMPTOM SEVERITY: "How bad is it?"      n/a 5. FEVER: "Is there a fever?" If so, ask: "What is it, how was it measured, and when did it start?"      no 6. PAST REACTIONS: "Have you reacted to immunizations before?" If so, ask: "What happened?"     Flu shot- patient had feet/leg  numbness- patient was given steroids and hospitalized 7. OTHER SYMPTOMS: "Do you have any other symptoms?"     n/a  Protocols used: CORONAVIRUS (COVID-19) VACCINE QUESTIONS AND REACTIONS-A-AH

## 2019-03-30 ENCOUNTER — Ambulatory Visit: Payer: Self-pay | Attending: Internal Medicine

## 2019-03-30 DIAGNOSIS — Z23 Encounter for immunization: Secondary | ICD-10-CM

## 2019-03-30 NOTE — Progress Notes (Signed)
   Covid-19 Vaccination Clinic  Name:  Janice Gross    MRN: [id number] DOB: 1955-01-05  03/30/2019  Janice Gross was observed post Covid-19 immunization for 15 minutes without incident. She was provided with Vaccine Information Sheet and instruction to access the V-Safe system.   Janice Gross was instructed to call 911 with any severe reactions post vaccine: Marland Kitchen Difficulty breathing  . Swelling of face and throat  . A fast heartbeat  . A bad rash all over body  . Dizziness and weakness   Immunizations Administered    Name Date Dose VIS Date Route   Pfizer COVID-19 Vaccine 03/30/2019  3:12 PM 0.3 mL 12/15/2018 Intramuscular   Manufacturer: [place]   Lot: [id number]   [place]: [id number]

## 2019-04-25 ENCOUNTER — Ambulatory Visit: Payer: Self-pay | Attending: Internal Medicine

## 2019-04-25 DIAGNOSIS — Z23 Encounter for immunization: Secondary | ICD-10-CM

## 2019-04-25 NOTE — Progress Notes (Signed)
   Covid-19 Vaccination Clinic  Name:  Kamali Carmouche    MRN: [id number] DOB: Aug 30, 1955  04/25/2019  Ms. Cieslinski was observed post Covid-19 immunization for 15 minutes without incident. She was provided with Vaccine Information Sheet and instruction to access the V-Safe system.   Ms. Datcher was instructed to call 911 with any severe reactions post vaccine: Marland Kitchen Difficulty breathing  . Swelling of face and throat  . A fast heartbeat  . A bad rash all over body  . Dizziness and weakness   Immunizations Administered    Name Date Dose VIS Date Route   Pfizer COVID-19 Vaccine 04/25/2019  8:20 AM 0.3 mL 02/28/2018 Intramuscular   Manufacturer: [place]   Lot: [id number]   [place]: [id number]

## 2019-11-08 IMAGING — MG DIGITAL SCREENING BILATERAL MAMMOGRAM WITH TOMO AND CAD
8 series · 8 of 24 positions shown · non-contrast
Comparison: Previous exam(s).

CLINICAL DATA: Screening.

EXAM:
DIGITAL SCREENING BILATERAL MAMMOGRAM WITH TOMO AND CAD

[L MLO synth-2D]
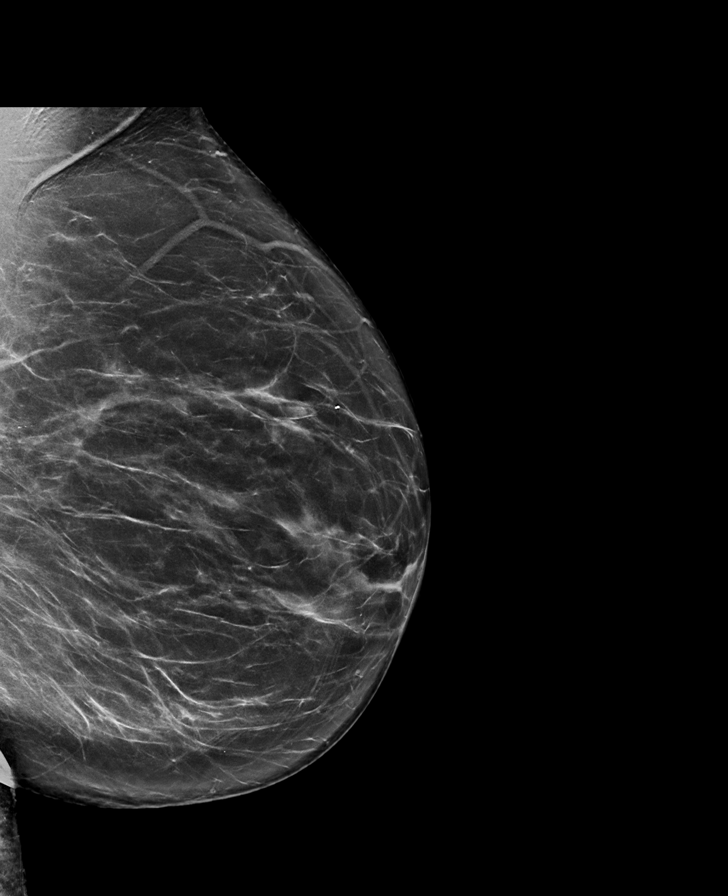

[L CC synth-2D]
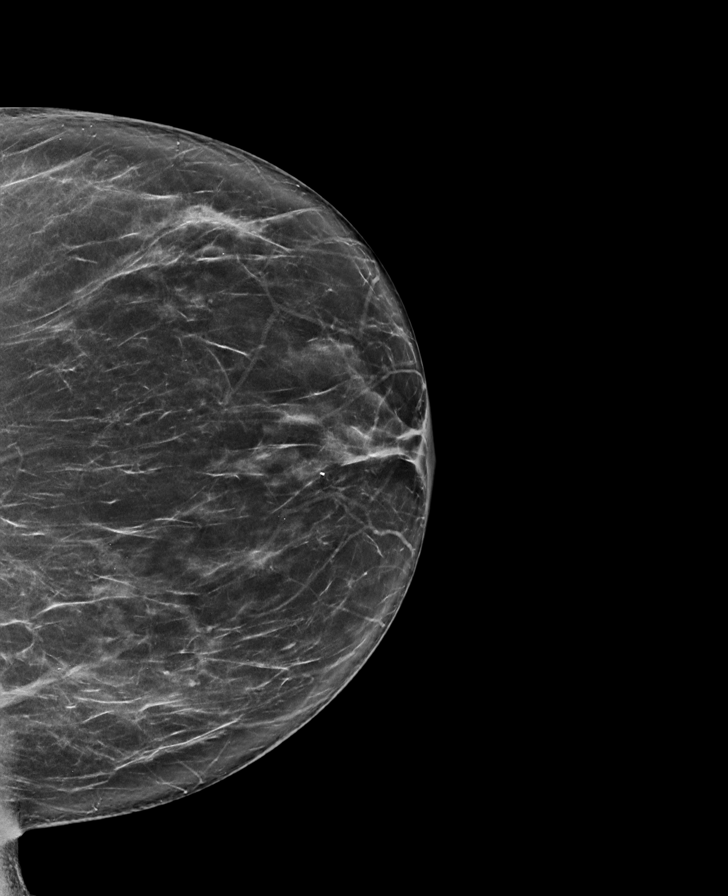

[R MLO synth-2D]
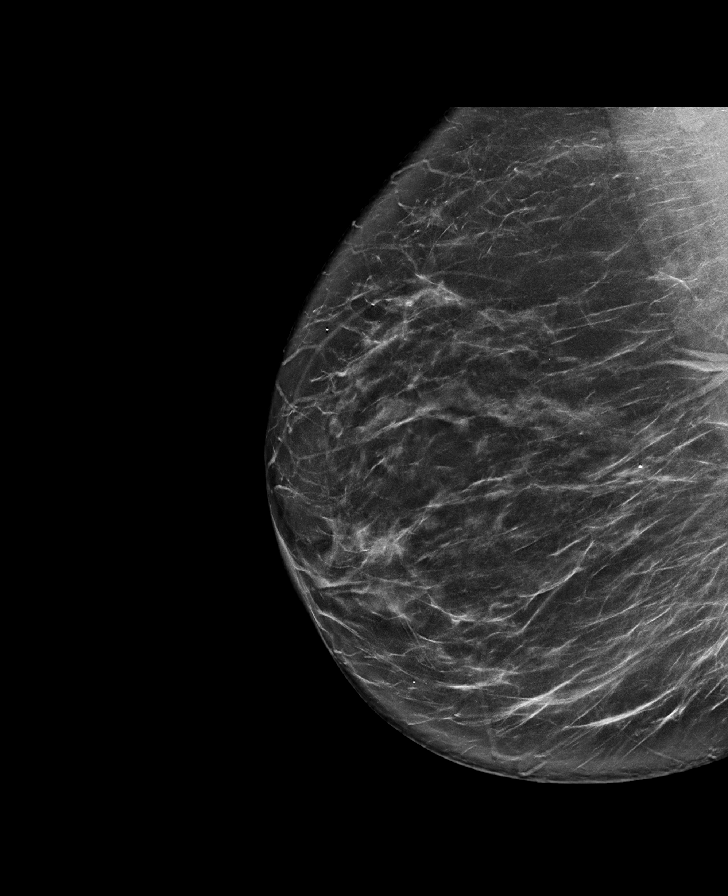

[R CC synth-2D]
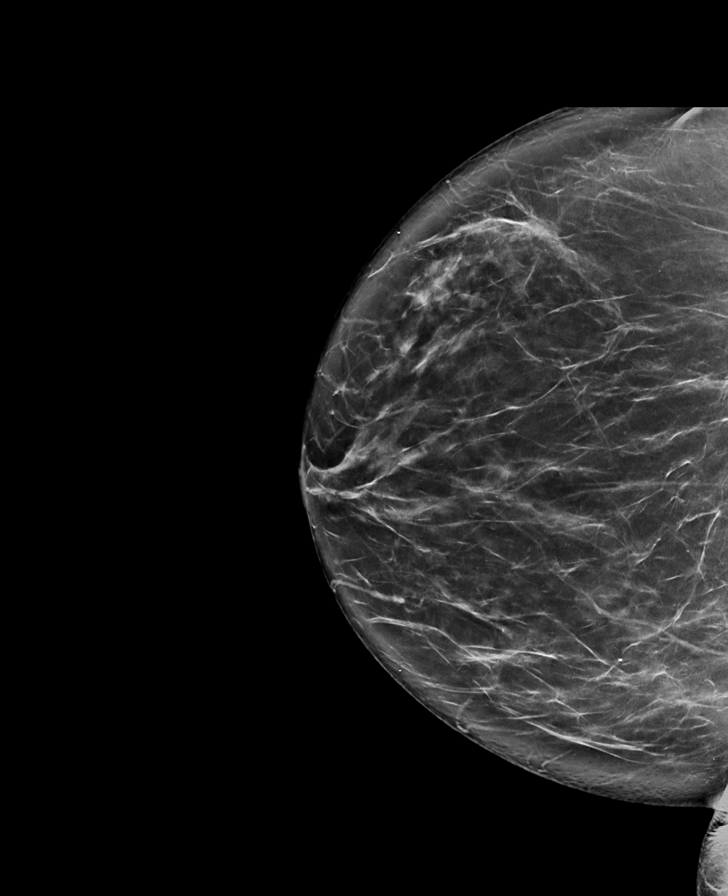

[R CC tomo · tomo slice 45/90.0]
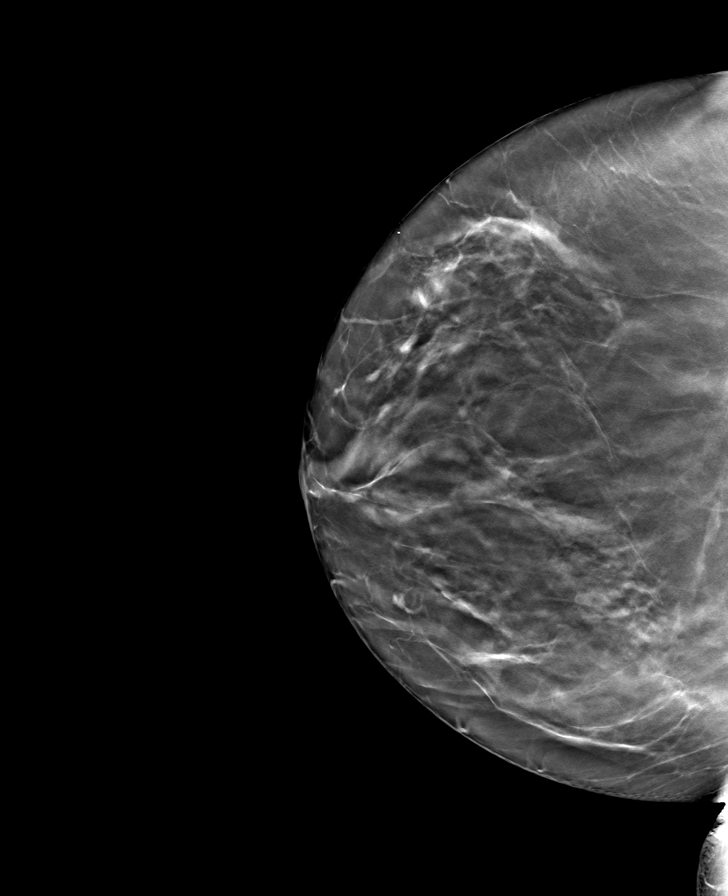

[L CC tomo · tomo slice 41/82.0]
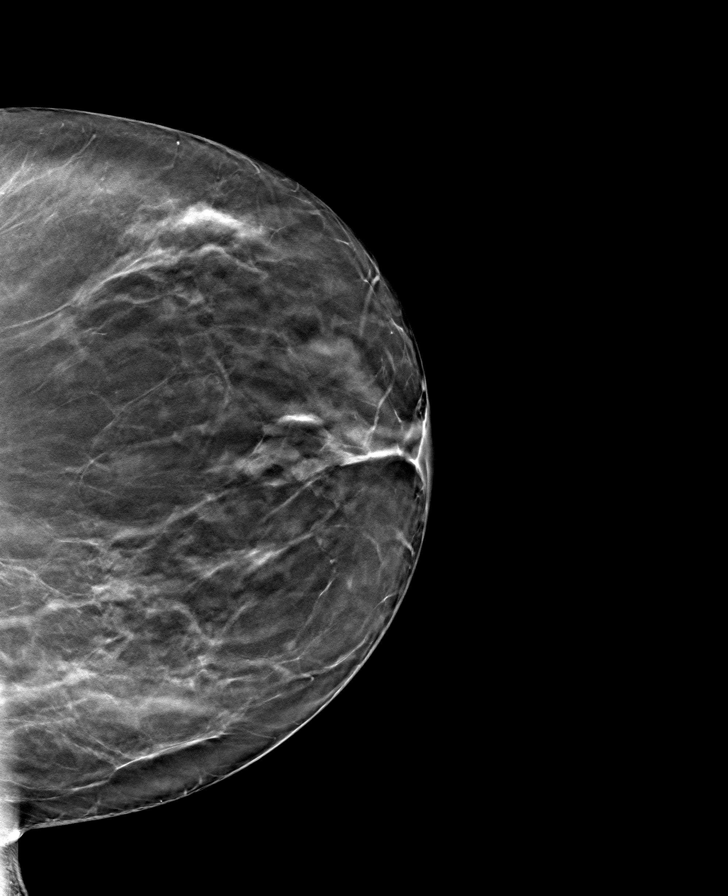

[L MLO tomo · tomo slice 45/88.0]
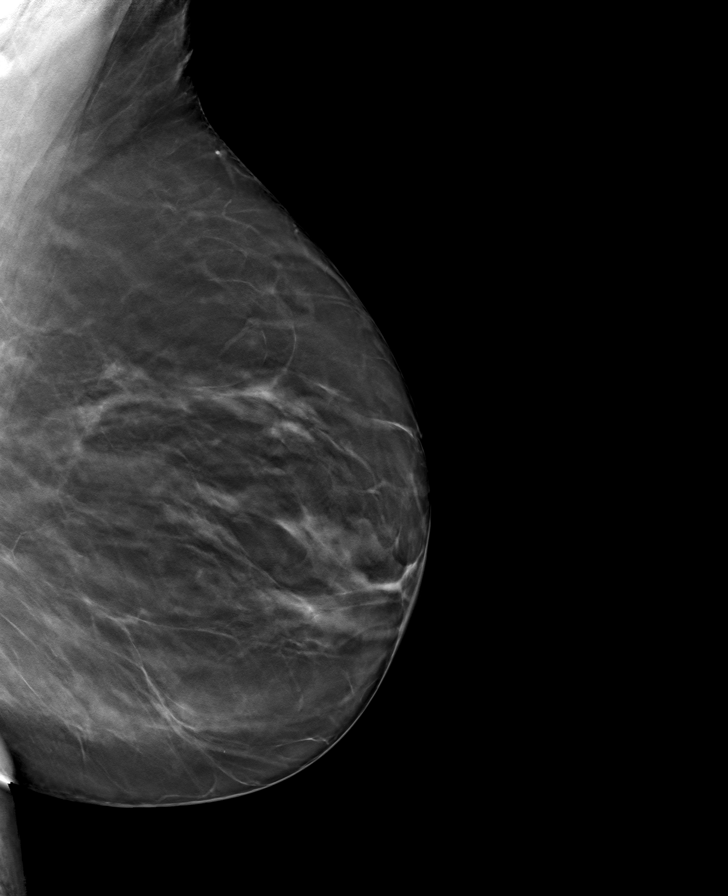

[R MLO tomo · tomo slice 45/88.0]
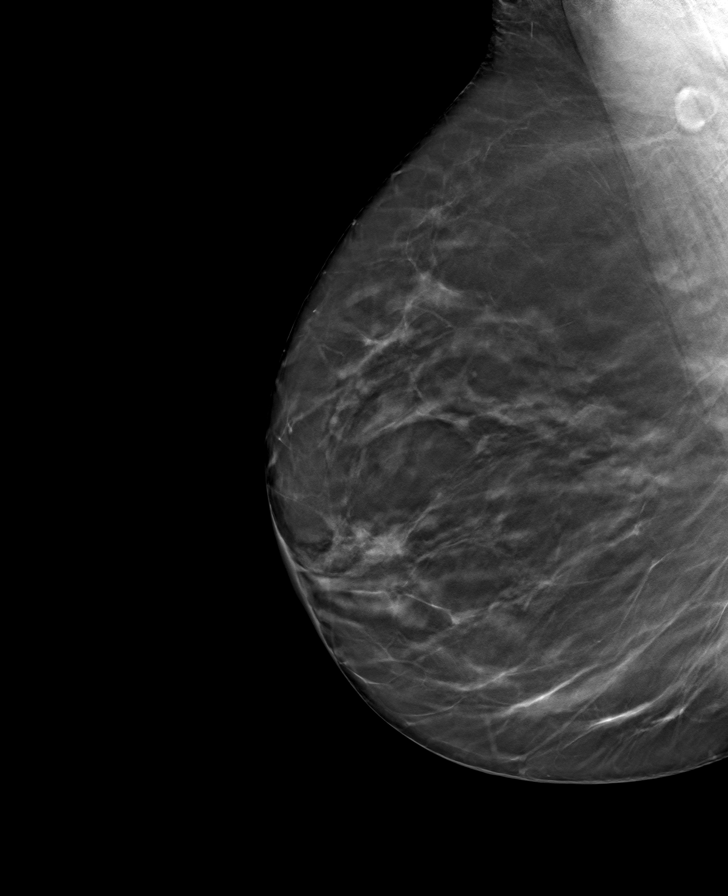

[8 of 24 positions shown; findings below may reference images not displayed]

ACR Breast Density Category b: There are scattered areas of
fibroglandular density.
FINDINGS: There are no findings suspicious for malignancy. Images were
processed with CAD.
IMPRESSION: No mammographic evidence of malignancy. A result letter of this
screening mammogram will be mailed directly to the patient.

RECOMMENDATION:
Screening mammogram in one year. (Code:[id number])

BI-RADS CATEGORY  1: Negative.

## 2020-01-16 DIAGNOSIS — H1045 Other chronic allergic conjunctivitis: Secondary | ICD-10-CM | POA: Diagnosis not present

## 2020-01-16 DIAGNOSIS — H2513 Age-related nuclear cataract, bilateral: Secondary | ICD-10-CM | POA: Diagnosis not present

## 2020-01-16 DIAGNOSIS — H04123 Dry eye syndrome of bilateral lacrimal glands: Secondary | ICD-10-CM | POA: Diagnosis not present

## 2020-05-07 DIAGNOSIS — R059 Cough, unspecified: Secondary | ICD-10-CM | POA: Diagnosis not present

## 2020-05-07 DIAGNOSIS — J069 Acute upper respiratory infection, unspecified: Secondary | ICD-10-CM | POA: Diagnosis not present

## 2020-05-13 DIAGNOSIS — S161XXA Strain of muscle, fascia and tendon at neck level, initial encounter: Secondary | ICD-10-CM | POA: Diagnosis not present

## 2020-09-26 DIAGNOSIS — Z1231 Encounter for screening mammogram for malignant neoplasm of breast: Secondary | ICD-10-CM | POA: Diagnosis not present

## 2020-10-22 DIAGNOSIS — H5203 Hypermetropia, bilateral: Secondary | ICD-10-CM | POA: Diagnosis not present

## 2020-10-22 DIAGNOSIS — H52209 Unspecified astigmatism, unspecified eye: Secondary | ICD-10-CM | POA: Diagnosis not present

## 2020-10-22 DIAGNOSIS — H524 Presbyopia: Secondary | ICD-10-CM | POA: Diagnosis not present

## 2020-11-10 DIAGNOSIS — I341 Nonrheumatic mitral (valve) prolapse: Secondary | ICD-10-CM | POA: Diagnosis not present

## 2020-11-10 DIAGNOSIS — J309 Allergic rhinitis, unspecified: Secondary | ICD-10-CM | POA: Diagnosis not present

## 2020-11-10 DIAGNOSIS — N951 Menopausal and female climacteric states: Secondary | ICD-10-CM | POA: Diagnosis not present

## 2020-11-10 DIAGNOSIS — E876 Hypokalemia: Secondary | ICD-10-CM | POA: Diagnosis not present

## 2020-11-10 DIAGNOSIS — E7800 Pure hypercholesterolemia, unspecified: Secondary | ICD-10-CM | POA: Diagnosis not present

## 2020-11-10 DIAGNOSIS — I10 Essential (primary) hypertension: Secondary | ICD-10-CM | POA: Diagnosis not present

## 2020-11-10 DIAGNOSIS — Z0000 Encounter for general adult medical examination without abnormal findings: Secondary | ICD-10-CM | POA: Diagnosis not present

## 2020-11-10 DIAGNOSIS — J0100 Acute maxillary sinusitis, unspecified: Secondary | ICD-10-CM | POA: Diagnosis not present

## 2020-11-10 DIAGNOSIS — K589 Irritable bowel syndrome without diarrhea: Secondary | ICD-10-CM | POA: Diagnosis not present

## 2020-12-11 DIAGNOSIS — Z23 Encounter for immunization: Secondary | ICD-10-CM | POA: Diagnosis not present

## 2020-12-18 DIAGNOSIS — R3915 Urgency of urination: Secondary | ICD-10-CM | POA: Diagnosis not present

## 2021-06-23 DIAGNOSIS — I10 Essential (primary) hypertension: Secondary | ICD-10-CM | POA: Diagnosis not present

## 2021-06-23 DIAGNOSIS — E7800 Pure hypercholesterolemia, unspecified: Secondary | ICD-10-CM | POA: Diagnosis not present

## 2021-07-29 DIAGNOSIS — H524 Presbyopia: Secondary | ICD-10-CM | POA: Diagnosis not present

## 2021-08-11 ENCOUNTER — Ambulatory Visit (HOSPITAL_COMMUNITY)
Admission: EM | Admit: 2021-08-11 | Discharge: 2021-08-11 | Disposition: A | Discharge status: Home / Self Care | Payer: PRIVATE HEALTH INSURANCE | Attending: Emergency Medicine | Admitting: Emergency Medicine

## 2021-08-11 ENCOUNTER — Encounter (HOSPITAL_COMMUNITY): Payer: Self-pay

## 2021-08-11 DIAGNOSIS — R079 Chest pain, unspecified: Secondary | ICD-10-CM

## 2021-08-11 DIAGNOSIS — H6122 Impacted cerumen, left ear: Secondary | ICD-10-CM

## 2021-08-11 NOTE — Discharge Instructions (Signed)
For your chest pain -I have low suspicion of involvement of your heart today and symptoms may be related to your gas and abdominal bloating -Your vital signs are stable, your EKG shows that your heart is beating at a regular pace and rhythm and on exam I am able to hear normal heartbeats -Attempt use of Pepto-Bismol, Maalox or simethicone to see if that helps minimize occurrence of symptoms -Please reach out to your primary doctor today to schedule a follow-up appointment for reevaluation of symptoms -You also been given information to her cardiologist group home you may schedule an appointment with for further evaluation as well  For your ear -On exam your left ear is clogged with wax and has been cleaned through water irrigation here today in our office -Moving forward you may purchase over-the-counter Debrox drops which help soften wax and make ears easier to clean -Try to avoid use of Q-tips as they generally will push the wax further down into the ear canal and with time will cause ears to become clogged -You may follow-up with urgent care as needed if symptoms recur

## 2021-08-11 NOTE — ED Triage Notes (Signed)
Ear fullness in the left ear since last Thursday. States it is not painful but feels clogged, reduced hearing in the left ear.   Patient states onset episodes of chest pain last night and again this morning. States it feels like a dull pain and felt like she needed to move to make it better. States after drinking water it went away. Patient did not drink her coffee this morning but around 930 am she felt the chest pain again at work.   No increased heard rate, dizziness, nausea, or radiation of the pain.

## 2021-08-11 NOTE — ED Provider Notes (Signed)
[place]    CSN: [id number] Arrival date & time: 08/11/21  1323      History   Chief Complaint Chief Complaint  Patient presents with   Ear Fullness   Chest Pain    HPI Janice Gross is a 66 y.o. female.   Patient presents with intermittent centralized chest pain beginning last night.  Episodes lasting approximately 1 to 2 minutes before spontaneous resolution, pain does not radiate and is described as a dull ache.  Has not attempted treatment of symptoms.  History of hypertension and MVP.  Denies shortness of breath, dizziness, lightheadedness, memory or speech changes, generalized weakness, tachycardia or palpitations.  Has taken blood pressure medication today and took evening dose early.  Endorses daily occurrence of bloating and increased gas production, history of GERD, taking daily medications.  Patient concerned with left-sided ear fullness beginning 5 days ago.  Associated decreased hearing and popping.  Has attempted to clean ears with Q-tips but was unsuccessful.  Denies ear drainage, pruritus, fever, chills, URI symptoms.   Past Medical History:  Diagnosis Date   Allergic rhinitis    Anemia    Anxiety    Constipation    GERD (gastroesophageal reflux disease)    Gestational diabetes    Headache(784.0)    IBS (irritable bowel syndrome)    Mitral valve prolapse    Neck pain    Palpitations     Patient Active Problem List   Diagnosis Date Noted   Mitral valve prolapse 12/03/2015   Anxiety 05/08/2015   Essential hypertension 05/08/2015   Genetic testing 11/23/2013   Family history of breast cancer in first degree relative 08/09/2012   Vaginal atrophy 08/09/2012   Menopause syndrome 08/09/2012   Dyspepsia and other specified disorders of function of stomach 05/03/2012   History of neurological disorder 04/04/2012   Overweight(278.02) 07/05/2011   Atrophic vaginitis 07/05/2011   Family history of breast cancer in mother 07/05/2011    Recurrent yeast infection 07/02/2011   ANEMIA 12/25/2008   CONSTIPATION 12/25/2008   NECK PAIN 12/25/2008   HEADACHE 12/25/2008   PALPITATIONS 12/25/2008   ANXIETY 03/24/2007   ALLERGIC RHINITIS 03/24/2007   GERD 03/24/2007   IRRITABLE BOWEL SYNDROME 03/24/2007   GESTATIONAL DIABETES 03/24/2007   MITRAL VALVE PROLAPSE, HX OF 03/24/2007    Past Surgical History:  Procedure Laterality Date   c section x 1     COLONOSCOPY  2006   Dr. Isa Rankin    LAPAROSCOPY     surgery to remove ovarian cyst      OB History     Gravida  1   Para  1   Term      Preterm      AB      Living  1      SAB      IAB      Ectopic      Multiple      Live Births               Home Medications    Prior to Admission medications   Medication Sig Start Date End Date Taking? Authorizing Provider  atenolol (TENORMIN) 50 MG tablet Take 1 tablet (50 mg total) by mouth daily. 05/15/15  Yes Lucretia Kern, DO  Calcium Carbonate-Vitamin D (CALTRATE 600+D) 600-400 MG-UNIT per tablet Take 1 tablet by mouth daily.   Yes [provider]  cholecalciferol (VITAMIN D) 400 UNITS TABS Take 400 Units by mouth daily.  Yes [provider]  fish oil-omega-3 fatty acids 1000 MG capsule Take 2 g by mouth daily.   Yes [provider]  ALPRAZolam Duanne Moron) 0.25 MG tablet 0.'25mg'$  nightly then wean per instructions provided Patient not taking: Reported on 12/22/2017 05/14/16   Terrance Mass, MD  benzonatate (TESSALON) 100 MG capsule Take 1 capsule (100 mg total) by mouth every 8 (eight) hours. 12/22/17   Loura Halt A, NP  cetirizine (ZYRTEC) 10 MG tablet Take 1 tablet (10 mg total) by mouth daily. 12/22/17   Loura Halt A, NP  chlorothiazide (DIURIL) 250 MG tablet Take 250 mg by mouth every morning.    [provider]  fluticasone (FLONASE) 50 MCG/ACT nasal spray Place 1 spray into both nostrils daily. 12/22/17   Loura Halt A, NP  hydrochlorothiazide (HYDRODIURIL) 25  MG tablet Take 1 tablet (25 mg total) by mouth daily. Patient not taking: Reported on 12/22/2017 01/02/16   Colin Benton R, DO  ipratropium (ATROVENT) 0.03 % nasal spray Place 2 sprays into both nostrils 2 (two) times daily. Patient not taking: Reported on 12/22/2017 04/17/16   Robyn Haber, MD  NONFORMULARY OR COMPOUNDED ITEM Estradiol 0.02 % 28m prefilled applicator Sig: apply twice a week 05/14/16   FTerrance Mass MD  polyethylene glycol ([facility]/ GFloria Raveling packet Take 17 g by mouth daily.    [provider]    Family History Family History  Problem Relation Age of Onset   Breast cancer Sister 626  Diabetes Sister    Breast cancer Sister 649  Diabetes Sister    Breast cancer Mother    Ovarian cancer Sister 61  Heart disease Father    Diabetes Brother    Lung cancer Brother    Breast cancer Other        dx in her 50s/dx in her 429s  Colon cancer Other 23   Lung cancer Other        dx in his 650s  Stomach cancer Neg Hx     Social History Social History   Tobacco Use   Smoking status: Never   Smokeless tobacco: Never   Tobacco comments:    tried as a teenager  Vaping Use   Vaping Use: Never used  Substance Use Topics   Alcohol use: No    Alcohol/week: 0.0 standard drinks of alcohol   Drug use: No     Allergies   Aspirin and Iodine   Review of Systems Review of Systems Defer to HPI    Physical Exam Triage Vital Signs ED Triage Vitals  Enc Vitals Group     BP 08/11/21 1345 (!) 171/85     Pulse Rate 08/11/21 1345 61     Resp 08/11/21 1345 16     Temp 08/11/21 1345 98.3 F (36.8 C)     Temp Source 08/11/21 1345 Oral     SpO2 08/11/21 1345 97 %     Weight 08/11/21 1350 145 lb (65.8 kg)     Height 08/11/21 1350 '5\' 1"'$  (1.549 m)     Head Circumference --      Peak Flow --      Pain Score 08/11/21 1349 4     Pain Loc --      Pain Edu? --      Excl. in [place] --    No data found.  Updated Vital Signs BP (!) 171/85 (BP Location: Right  Arm)   Pulse 61   Temp  98.3 F (36.8 C) (Oral)   Resp 16   Ht '5\' 1"'$  (1.549 m)   Wt 145 lb (65.8 kg)   SpO2 97%   BMI 27.40 kg/m   Visual Acuity Right Eye Distance:   Left Eye Distance:   Bilateral Distance:    Right Eye Near:   Left Eye Near:    Bilateral Near:     Physical Exam Constitutional:      Appearance: Normal appearance.  HENT:     Head: Normocephalic.     Right Ear: Tympanic membrane, ear canal and external ear normal.     Left Ear: There is impacted cerumen.     Nose: Nose normal.     Mouth/Throat:     Mouth: Mucous membranes are moist.     Pharynx: Oropharynx is clear.  Eyes:     Extraocular Movements: Extraocular movements intact.  Cardiovascular:     Rate and Rhythm: Normal rate and regular rhythm.     Pulses: Normal pulses.     Heart sounds: Normal heart sounds.  Pulmonary:     Effort: Pulmonary effort is normal.     Breath sounds: Normal breath sounds.  Skin:    General: Skin is warm and dry.  Neurological:     Mental Status: She is alert and oriented to person, place, and time. Mental status is at baseline.  Psychiatric:        Mood and Affect: Mood normal.        Behavior: Behavior normal.      UC Treatments / Results  Labs (all labs ordered are listed, but only abnormal results are displayed) Labs Reviewed - No data to display  EKG   Radiology No results found.  Procedures Procedures (including critical care time)  Medications Ordered in UC Medications - No data to display  Initial Impression / Assessment and Plan / UC Course  I have reviewed the triage vital signs and the nursing notes.  Pertinent labs & imaging results that were available during my care of the patient were reviewed by me and considered in my medical decision making (see chart for details).  Cerumen of the left ear Chest pain  Irrigation completed by nursing staff, successful, no signs of infection or abnormalities to the ear on reevaluation, recommended  over-the-counter Debrox drops moving forward and discontinuation of Q-tips, may follow-up with urgent care as needed if symptoms persist or recur  Vital signs are stable, patient is in no signs of distress, EKG showing normal sinus rhythm and S1 and S2 heard to auscultation, low suspicion of cardiac involvement at this time, etiology may be related to GERD, recommended use of over-the-counter medications to reduce gas and acid, recommended follow-up with PCP within the week and given walker referral to cardiologist, given strict precautions that if symptoms increase in intensity and frequency she is to go to the nearest emergency department for further evaluation, verbalized understanding Final Clinical Impressions(s) / UC Diagnoses   Final diagnoses:  None   Discharge Instructions   None    ED Prescriptions   None    PDMP not reviewed this encounter.   Hans Eden, NP 08/12/21 1705

## 2021-08-14 DIAGNOSIS — R079 Chest pain, unspecified: Secondary | ICD-10-CM | POA: Diagnosis not present

## 2021-09-21 NOTE — Progress Notes (Unsigned)
Cardiology Office Note:    Date:  09/22/2021   ID:  Janice Gross, DOB 1955-11-05, MRN [id number]  PCP:  Kelton Pillar, MD  Cardiologist:  None   Referring MD: Kelton Pillar, MD   Chief Complaint  Patient presents with   Chest Pain    History of Present Illness:    Janice Gross is a 66 y.o. female with a hx of MVP, palpitations, and being evaluated for chest pain.  The patient awakened 1 month ago with an unusual feeling in her chest.  It lasted less than 10 minutes but frightened her because she felt something was wrong and that the discomfort/feeling would recur but it never did.  Subsequently she has been fine.  She has had no shortness of breath or exertional related discomfort.  Other than saying that there was an unusual feeling in the chest, she cannot give any further details.  The following day she was seen at an urgent care center because of a sinus issue and was also evaluated by a physician assistant who also could not find any particular problem relative to the feeling she had in her chest the previous night.  She has a history of mitral valve prolapse and palpitations for years.  She has never had any diagnostic imaging to confirm the diagnosis.  Past Medical History:  Diagnosis Date   Allergic rhinitis    Anemia    Anxiety    Constipation    GERD (gastroesophageal reflux disease)    Gestational diabetes    Headache(784.0)    IBS (irritable bowel syndrome)    Mitral valve prolapse    Neck pain    Palpitations     Past Surgical History:  Procedure Laterality Date   c section x 1     COLONOSCOPY  2006   Dr. Isa Rankin    LAPAROSCOPY     surgery to remove ovarian cyst      Current Medications: Current Meds  Medication Sig   atenolol (TENORMIN) 50 MG tablet Take 1 tablet (50 mg total) by mouth daily.   Calcium Carbonate-Vitamin D (CALTRATE 600+D) 600-400 MG-UNIT per tablet Take 1 tablet by mouth daily.   cholecalciferol (VITAMIN D) 400  UNITS TABS Take 400 Units by mouth daily.   fish oil-omega-3 fatty acids 1000 MG capsule Take 2 g by mouth daily.   polyethylene glycol (MIRALAX / GLYCOLAX) packet Take 17 g by mouth daily.     Allergies:   Aspirin and Iodine   Social History   Socioeconomic History   Marital status: Divorced    Spouse name: Not on file   Number of children: 1   Years of education: Not on file   Highest education level: Not on file  Occupational History   Occupation: Marine scientist: [employer]  Tobacco Use   Smoking status: Never   Smokeless tobacco: Never   Tobacco comments:    tried as a teenager  Media planner   Vaping Use: Never used  Substance and Sexual Activity   Alcohol use: No    Alcohol/week: 0.0 standard drinks of alcohol   Drug use: No   Sexual activity: Not Currently    Birth control/protection: None  Other Topics Concern   Not on file  Social History Narrative   Work or School: Engineer, 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 - reports loves her job      Home Situation: lives alone  Spiritual Beliefs: Christian      Lifestyle:walking on a regular basis; diet is fair         Social Determinants of Radio broadcast assistant Strain: Not on file  Food Insecurity: Not on file  Transportation Needs: Not on file  Physical Activity: Not on file  Stress: Not on file  Social Connections: Not on file     Family History: The patient's family history includes Breast cancer in her mother and another family member; Breast cancer (age of onset: 1) in her sister; Breast cancer (age of onset: 20) in her sister; Colon cancer (age of onset: 22) in an other family member; Diabetes in her brother, sister, and sister; Heart disease in her father; Lung cancer in her brother and another family member; Ovarian cancer (age of onset: 73) in her sister. There is no history of Stomach cancer.  ROS:   Please see the history of present illness.    She  has difficulty sleeping.  She awakens from sleep and is not able to go back to sleep.  She wants [place] to give her some suggestions on what she can do about this problem (I referred her back to primary care for that).  All other systems reviewed and are negative.  EKGs/Labs/Other Studies Reviewed:    The following studies were reviewed today: No cardiac imaging or testing  Laboratory data: LDL cholesterol 114; hypercholesterolemia 197   EKG:  EKG An EKG done subsequent to the episode of chest discomfort on 08/11/2021 (the morning after the episode of discomfort) revealed sinus bradycardia but was otherwise unremarkable.  She is on Tenormin because of a history of "" mitral valve prolapse.  Recent Labs: No results found for requested labs within last 365 days.  Recent Lipid Panel    Component Value Date/Time   CHOL 228 (H) 05/27/2015 1415   TRIG 46.0 06/04/2013 0904   HDL 76.50 05/27/2015 1415   CHOLHDL 3 06/04/2013 0904   VLDL 9.2 06/04/2013 0904   LDLCALC 118 (H) 06/04/2013 0904   LDLDIRECT 109.3 03/17/2010 1015    Physical Exam:    VS:  BP (!) 150/90 (BP Location: Left Arm, Patient Position: Sitting, Cuff Size: Normal)   Pulse 62   Ht '5\' 1"'$  (1.549 m)   Wt 149 lb (67.6 kg)   SpO2 96%   BMI 28.15 kg/m     Wt Readings from Last 3 Encounters:  09/22/21 149 lb (67.6 kg)  08/11/21 145 lb (65.8 kg)  05/14/16 140 lb 6.4 oz (63.7 kg)     GEN: Slightly overweight with BMI 28. No acute distress HEENT: Normal NECK: No JVD. LYMPHATICS: No lymphadenopathy CARDIAC: No murmur. RRR no gallop, or edema. VASCULAR:  Normal Pulses. No bruits. RESPIRATORY:  Clear to auscultation without rales, wheezing or rhonchi  ABDOMEN: Soft, non-tender, non-distended, No pulsatile mass, MUSCULOSKELETAL: No deformity  SKIN: Warm and dry NEUROLOGIC:  Alert and oriented x 3 PSYCHIATRIC:  Normal affect   ASSESSMENT:    1. Chest pain of uncertain etiology   2. Mitral valve prolapse   3. Essential  hypertension   4. Palpitations   5. Sleep disturbance    PLAN:    In order of problems listed above:  Uncertain cause/etiology.  This episode lasted greater than 1 month ago.  She has felt fine since that time.  Episode lasted 10 minutes.  I have recommended clinical observation and report any recurrence of the symptoms at which time perhaps some evaluation would be  recommended.  Currently it is difficult to tell if this was discomfort or palpitation if she cannot help [place] to further identify. No clinical evidence of mitral valve prolapse on exam. Blood pressure while sitting 160/95 mmHg and while standing 132/88 mmHg. She did not feel that the episode in the middle of the night was palpitations as she is experienced them in the past. She should discuss this with primary care.     Medication Adjustments/Labs and Tests Ordered: Current medicines are reviewed at length with the patient today.  Concerns regarding medicines are outlined above.  No orders of the defined types were placed in this encounter.  No orders of the defined types were placed in this encounter.   Patient Instructions  Medication Instructions:  Your physician recommends that you continue on your current medications as directed. Please refer to the Current Medication list given to you today.  *If you need a refill on your cardiac medications before your next appointment, please call your pharmacy*  Lab Work: NONE  Testing/Procedures: NONE  Follow-Up: As needed  Important Information About Sugar         Signed, Sinclair Grooms, MD  09/22/2021 3:44 PM    [place]

## 2021-09-22 ENCOUNTER — Encounter: Payer: Self-pay | Admitting: Interventional Cardiology

## 2021-09-22 ENCOUNTER — Ambulatory Visit: Payer: Medicare HMO | Attending: Interventional Cardiology | Admitting: Interventional Cardiology

## 2021-09-22 VITALS — BP 150/90 | HR 62 | Ht 61.0 in | Wt 149.0 lb

## 2021-09-22 DIAGNOSIS — R079 Chest pain, unspecified: Secondary | ICD-10-CM

## 2021-09-22 DIAGNOSIS — R002 Palpitations: Secondary | ICD-10-CM

## 2021-09-22 DIAGNOSIS — I10 Essential (primary) hypertension: Secondary | ICD-10-CM

## 2021-09-22 DIAGNOSIS — G479 Sleep disorder, unspecified: Secondary | ICD-10-CM

## 2021-09-22 DIAGNOSIS — I341 Nonrheumatic mitral (valve) prolapse: Secondary | ICD-10-CM | POA: Diagnosis not present

## 2021-09-22 NOTE — Patient Instructions (Signed)
Medication Instructions:  Your physician recommends that you continue on your current medications as directed. Please refer to the Current Medication list given to you today.  *If you need a refill on your cardiac medications before your next appointment, please call your pharmacy*  Lab Work: NONE  Testing/Procedures: NONE  Follow-Up: As needed  Important Information About Sugar       

## 2021-10-10 DIAGNOSIS — Z1231 Encounter for screening mammogram for malignant neoplasm of breast: Secondary | ICD-10-CM | POA: Diagnosis not present

## 2021-10-21 DIAGNOSIS — N6322 Unspecified lump in the left breast, upper inner quadrant: Secondary | ICD-10-CM | POA: Diagnosis not present

## 2021-10-21 DIAGNOSIS — R921 Mammographic calcification found on diagnostic imaging of breast: Secondary | ICD-10-CM | POA: Diagnosis not present

## 2021-10-29 ENCOUNTER — Other Ambulatory Visit: Payer: Self-pay

## 2021-10-29 DIAGNOSIS — C50212 Malignant neoplasm of upper-inner quadrant of left female breast: Secondary | ICD-10-CM | POA: Diagnosis not present

## 2021-11-02 ENCOUNTER — Telehealth: Payer: Self-pay | Admitting: *Deleted

## 2021-11-02 ENCOUNTER — Encounter: Payer: Self-pay | Admitting: *Deleted

## 2021-11-02 NOTE — Telephone Encounter (Signed)
Received call back from patient to confirm appt for 11/1 at 815am. Emailed packet.

## 2021-11-02 NOTE — Telephone Encounter (Signed)
Left message for a return phone call to give [facility] appt for 11/1 at 815am. Gave appt time and date. Requested return phone call to discuss additional information.

## 2021-11-03 ENCOUNTER — Encounter: Payer: Self-pay | Admitting: Family Medicine

## 2021-11-03 ENCOUNTER — Other Ambulatory Visit: Payer: Self-pay | Admitting: *Deleted

## 2021-11-03 DIAGNOSIS — Z170 Estrogen receptor positive status [ER+]: Secondary | ICD-10-CM

## 2021-11-03 NOTE — Progress Notes (Signed)
Radiation Oncology         [phone] ________________________________  Name: Janice Gross        MRN: [id number]  Date of Service: 11/04/2021 DOB: Aug 27, 1955  OE:[id number], [name], MD  [name], MD     REFERRING PHYSICIAN: [name], MD   DIAGNOSIS: There were no encounter diagnoses.   HISTORY OF PRESENT ILLNESS: Janice Gross is a 66 y.o. female seen in the multidisciplinary breast clinic for a new diagnosis of *** breast cancer. The patient was noted to have ***.    PREVIOUS RADIATION THERAPY: {EXAM; YES/NO:19492::"No"}   PAST MEDICAL HISTORY:  Past Medical History:  Diagnosis Date   Allergic rhinitis    Anemia    Anxiety    Constipation    GERD (gastroesophageal reflux disease)    Gestational diabetes    Headache(784.0)    IBS (irritable bowel syndrome)    Mitral valve prolapse    Neck pain    Palpitations        PAST SURGICAL HISTORY: Past Surgical History:  Procedure Laterality Date   c section x 1     COLONOSCOPY  2006   Dr. Isa Rankin    LAPAROSCOPY     surgery to remove ovarian cyst       FAMILY HISTORY:  Family History  Problem Relation Age of Onset   Breast cancer Sister 19   Diabetes Sister    Breast cancer Sister 7   Diabetes Sister    Breast cancer Mother    Ovarian cancer Sister 42   Heart disease Father    Diabetes Brother    Lung cancer Brother    Breast cancer Other        dx in her 50s/dx in her 24s   Colon cancer Other 16   Lung cancer Other        dx in his 55s   Stomach cancer Neg Hx      SOCIAL HISTORY:  reports that she has never smoked. She has never used smokeless tobacco. She reports that she does not drink alcohol and does not use drugs.   ALLERGIES: Aspirin and Iodine   MEDICATIONS:  Current Outpatient Medications  Medication Sig Dispense Refill   atenolol (TENORMIN) 50 MG tablet Take 1 tablet (50 mg total) by mouth daily. 90 tablet 3   Calcium Carbonate-Vitamin D (CALTRATE  600+D) 600-400 MG-UNIT per tablet Take 1 tablet by mouth daily.     cholecalciferol (VITAMIN D) 400 UNITS TABS Take 400 Units by mouth daily.     fish oil-omega-3 fatty acids 1000 MG capsule Take 2 g by mouth daily.     polyethylene glycol (MIRALAX / GLYCOLAX) packet Take 17 g by mouth daily.     No current facility-administered medications for this visit.     REVIEW OF SYSTEMS: On review of systems, the patient reports that she is doing ***     PHYSICAL EXAM:  Wt Readings from Last 3 Encounters:  09/22/21 149 lb (67.6 kg)  08/11/21 145 lb (65.8 kg)  05/14/16 140 lb 6.4 oz (63.7 kg)   Temp Readings from Last 3 Encounters:  08/11/21 98.3 F (36.8 C) (Oral)  12/22/17 97.9 F (36.6 C)  04/17/16 98.4 F (36.9 C) (Oral)   BP Readings from Last 3 Encounters:  09/22/21 (!) 150/90  08/11/21 (!) 171/85  12/22/17 112/76   Pulse Readings from Last 3 Encounters:  09/22/21 62  08/11/21 61  12/22/17 74    In general this  is a well appearing *** female in no acute distress. She's alert and oriented x4 and appropriate throughout the examination. Cardiopulmonary assessment is negative for acute distress and she exhibits normal effort. Bilateral breast exam is deferred.    ECOG = ***  0 - Asymptomatic (Fully active, able to carry on all predisease activities without restriction)  1 - Symptomatic but completely ambulatory (Restricted in physically strenuous activity but ambulatory and able to carry out work of a light or sedentary nature. For example, light housework, office work)  2 - Symptomatic, <50% in bed during the day (Ambulatory and capable of all self care but unable to carry out any work activities. Up and about more than 50% of waking hours)  3 - Symptomatic, >50% in bed, but not bedbound (Capable of only limited self-care, confined to bed or chair 50% or more of waking hours)  4 - Bedbound (Completely disabled. Cannot carry on any self-care. Totally confined to bed or  chair)  5 - Death   Eustace Pen MM, Creech RH, Tormey DC, et al. [phone]). "Toxicity and response criteria of the [facility]". [place] Oncol. 5 (6): 649-55    LABORATORY DATA:  Lab Results  Component Value Date   WBC 5.3 04/04/2012   HGB 13.9 04/04/2012   HCT 40.3 04/04/2012   MCV 91.8 04/04/2012   PLT 210.0 04/04/2012   Lab Results  Component Value Date   NA 143 05/27/2015   K 4.5 05/27/2015   CL 105 05/27/2015   CO2 31 05/27/2015   Lab Results  Component Value Date   ALT 32 04/04/2012   AST 34 04/04/2012   ALKPHOS 51 04/04/2012   BILITOT 0.6 04/04/2012      RADIOGRAPHY: No results found.     IMPRESSION/PLAN: 1. *** 2. Possible genetic predisposition to malignancy. The patient is a candidate for genetic testing given *** personal and family history. She will meet with our geneticist today in clinic.   In a visit lasting *** minutes, greater than 50% of the time was spent face to face reviewing her case, as well as in preparation of, discussing, and coordinating the patient's care.  The above documentation reflects my direct findings during this shared patient visit. Please see the separate note by Dr. Lisbeth Renshaw on this date for the remainder of the patient's plan of care.    Carola Rhine, [facility]    **Disclaimer: This note was dictated with voice recognition software. Similar sounding words can inadvertently be transcribed and this note may contain transcription errors which may not have been corrected upon publication of note.**

## 2021-11-04 ENCOUNTER — Inpatient Hospital Stay: Payer: Medicare HMO | Admitting: Licensed Clinical Social Worker

## 2021-11-04 ENCOUNTER — Ambulatory Visit: Payer: Medicare HMO | Attending: Surgery | Admitting: Physical Therapy

## 2021-11-04 ENCOUNTER — Other Ambulatory Visit: Payer: Self-pay

## 2021-11-04 ENCOUNTER — Encounter: Payer: Self-pay | Admitting: *Deleted

## 2021-11-04 ENCOUNTER — Inpatient Hospital Stay: Payer: Medicare HMO | Attending: Hematology and Oncology | Admitting: Hematology and Oncology

## 2021-11-04 ENCOUNTER — Other Ambulatory Visit: Payer: Self-pay | Admitting: Surgery

## 2021-11-04 ENCOUNTER — Inpatient Hospital Stay: Payer: Medicare HMO

## 2021-11-04 ENCOUNTER — Ambulatory Visit
Admission: RE | Admit: 2021-11-04 | Discharge: 2021-11-04 | Disposition: A | Discharge status: Home / Self Care | Payer: Medicare HMO | Source: Ambulatory Visit | Attending: Radiation Oncology | Admitting: Radiation Oncology

## 2021-11-04 ENCOUNTER — Encounter: Payer: Self-pay | Admitting: Physical Therapy

## 2021-11-04 DIAGNOSIS — I341 Nonrheumatic mitral (valve) prolapse: Secondary | ICD-10-CM | POA: Diagnosis not present

## 2021-11-04 DIAGNOSIS — Z170 Estrogen receptor positive status [ER+]: Secondary | ICD-10-CM

## 2021-11-04 DIAGNOSIS — C50512 Malignant neoplasm of lower-outer quadrant of left female breast: Secondary | ICD-10-CM | POA: Insufficient documentation

## 2021-11-04 DIAGNOSIS — C50912 Malignant neoplasm of unspecified site of left female breast: Secondary | ICD-10-CM | POA: Diagnosis not present

## 2021-11-04 DIAGNOSIS — Z803 Family history of malignant neoplasm of breast: Secondary | ICD-10-CM

## 2021-11-04 DIAGNOSIS — I10 Essential (primary) hypertension: Secondary | ICD-10-CM

## 2021-11-04 DIAGNOSIS — R293 Abnormal posture: Secondary | ICD-10-CM | POA: Diagnosis not present

## 2021-11-04 DIAGNOSIS — Z853 Personal history of malignant neoplasm of breast: Secondary | ICD-10-CM

## 2021-11-04 DIAGNOSIS — C50212 Malignant neoplasm of upper-inner quadrant of left female breast: Secondary | ICD-10-CM

## 2021-11-04 LAB — CBC WITH DIFFERENTIAL (CANCER CENTER ONLY)
Abs Immature Granulocytes: 0.01 10*3/uL (ref 0.00–0.07)
Basophils Absolute: 0 10*3/uL (ref 0.0–0.1)
Basophils Relative: 1 %
Eosinophils Absolute: 0.1 10*3/uL (ref 0.0–0.5)
Eosinophils Relative: 2 %
HCT: 42.6 % (ref 36.0–46.0)
Hemoglobin: 14.7 g/dL (ref 12.0–15.0)
Immature Granulocytes: 0 %
Lymphocytes Relative: 41 %
Lymphs Abs: 1.2 10*3/uL (ref 0.7–4.0)
MCH: 31.3 pg (ref 26.0–34.0)
MCHC: 34.5 g/dL (ref 30.0–36.0)
MCV: 90.6 fL (ref 80.0–100.0)
Monocytes Absolute: 0.2 10*3/uL (ref 0.1–1.0)
Monocytes Relative: 6 %
Neutro Abs: 1.5 10*3/uL — ABNORMAL LOW (ref 1.7–7.7)
Neutrophils Relative %: 50 %
Platelet Count: 200 10*3/uL (ref 150–400)
RBC: 4.7 MIL/uL (ref 3.87–5.11)
RDW: 12.1 % (ref 11.5–15.5)
WBC Count: 2.9 10*3/uL — ABNORMAL LOW (ref 4.0–10.5)
nRBC: 0 % (ref 0.0–0.2)

## 2021-11-04 LAB — CMP (CANCER CENTER ONLY)
ALT: 12 U/L (ref 0–44)
AST: 20 U/L (ref 15–41)
Albumin: 4.2 g/dL (ref 3.5–5.0)
Alkaline Phosphatase: 49 U/L (ref 38–126)
Anion gap: 6 (ref 5–15)
BUN: 15 mg/dL (ref 8–23)
CO2: 29 mmol/L (ref 22–32)
Calcium: 9.5 mg/dL (ref 8.9–10.3)
Chloride: 107 mmol/L (ref 98–111)
Creatinine: 0.79 mg/dL (ref 0.44–1.00)
GFR, Estimated: >60 mL/min (ref >60)
Glucose, Bld: 127 mg/dL — ABNORMAL HIGH (ref 70–99)
Potassium: 3.9 mmol/L (ref 3.5–5.1)
Sodium: 142 mmol/L (ref 135–145)
Total Bilirubin: 0.8 mg/dL (ref 0.3–1.2)
Total Protein: 6.7 g/dL (ref 6.5–8.1)

## 2021-11-04 LAB — GENETIC SCREENING ORDER

## 2021-11-04 NOTE — Progress Notes (Signed)
[place] Work  Initial Assessment   Janice Gross is a 66 y.o. year old female presenting alone. Clinical Social Work was referred by  [facility]  for assessment of psychosocial needs.   SDOH (Social Determinants of Health) assessments performed: Yes SDOH Interventions    Flowsheet Row Clinical Support from 11/04/2021 in [facility]  SDOH Interventions   Food Insecurity Interventions Intervention Not Indicated  Housing Interventions Intervention Not Indicated  Transportation Interventions Intervention Not Indicated  Financial Strain Interventions Intervention Not Indicated       SDOH Screenings   Food Insecurity: No Food Insecurity (11/04/2021)  Housing: Low Risk  (11/04/2021)  Transportation Needs: No Transportation Needs (11/04/2021)  Financial Resource Strain: Low Risk  (11/04/2021)  Tobacco Use: Low Risk  (11/04/2021)     Distress Screen completed: Yes    11/04/2021   10:50 AM  ONCBCN DISTRESS SCREENING  Screening Type Initial Screening  Distress experienced in past week (1-10) 8  Practical problem type Work/school;Transportation  Emotional problem type Nervousness/Anxiety;Adjusting to illness;Isolation/feeling alone;Adjusting to appearance changes  Information Concerns Type Lack of info about diagnosis;Lack of info about treatment;Lack of info about maintaining fitness  Physical Problem type Sleep/insomnia      Family/Social Information:  Housing Arrangement: patient lives alone Family members/support persons in your life? Family (son (62), sisters, nieces, nephews), Friends, and Education officer, community concerns: yes, if unable to drive herself  Employment: retired from being a Banker. Now works Engineer, petroleum for [employer] .  Income source: Employment Financial concerns: No Type of concern: None Food access concerns: no Religious or spiritual practice: Yes-prayer helpful Services Currently in place:  n/a  Coping/  Adjustment to diagnosis: Patient understands treatment plan and what happens next? yes, feels relieved after receiving more information from medical team Patient reported stressors: Anxiety/ nervousness and Adjusting to my illness Current coping skills/ strengths: Capable of independent living , Communication skills , Motivation for treatment/growth , Religious Affiliation , and Supportive family/friends     SUMMARY: Current SDOH Barriers:  None noted today  Clinical Social Work Clinical Goal(s):  No clinical social work goals at this time  Interventions: Discussed common feeling and emotions when being diagnosed with cancer, and the importance of support during treatment Informed patient of the support team roles and support services at [facility] Provided [place] contact information and encouraged patient to call with any questions or concerns   Follow Up Plan: Patient will contact CSW with any support or resource needs Patient verbalizes understanding of plan: Yes    Wessie Shanks E Jakhiya Brower, LCSW

## 2021-11-04 NOTE — Assessment & Plan Note (Signed)
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  We have discussed about upfront surgery followed by Oncotype testing given small tumor and favorable prognostics.  We have discussed the following details about Oncotype.  We have discussed about Oncotype Dx score which is a well validated prognostic scoring system which can predict outcome with endocrine therapy alone and whether chemotherapy reduces recurrence.  Typically in patients with ER positive cancers that are node negative if the RS score is high typically greater than or equal to 26, chemotherapy is recommended.  In women with intermediate recurrence score younger than 47, there can still be some role for chemotherapy in addition to endocrine therapy especially if the recurrence score is between 21-25.  Given her favorable prognostics, it is more than likely that she would not need any adjuvant chemotherapy.  Following Oncotype testing, she may proceed with radiation if Oncotype confirms the above-mentioned and after radiation she will proceed with antiestrogen therapy.  I have discussed the following details about antiestrogen therapy.  We have discussed options for antiestrogen therapy today  With regards to Tamoxifen, we discussed that this is a SERM, selective estrogen receptor modulator. We discussed mechanism of action of Tamoxifen, adverse effects on Tamoxifen including but not limited to post menopausal symptoms, increased risk of DVT/PE, increased risk of endometrial cancer, questionable cataracts with long term use and increased risk of cardiovascular events in the study which was not statistically significant. A benefit from Tamoxifen would be improvement in bone density. With regards to aromatase inhibitors, we discussed mechanism of action,  adverse effects including but not limited to post menopausal symptoms, arthralgias, myalgias, increased risk of cardiovascular events and bone loss.   I have given her printed information about tamoxifen and anastrozole.  At this time she is leaning towards anastrozole.  She will return to clinic after surgery to review additional recommendations.  We have discussed about genetic testing and she is agreeable.

## 2021-11-04 NOTE — Therapy (Signed)
OUTPATIENT PHYSICAL THERAPY BREAST CANCER BASELINE EVALUATION   Patient Name: Neave Lenger MRN: [id number] DOB:21-Sep-1955, 66 y.o., female Today's Date: 11/04/2021   PT End of Session - 11/04/21 1121     Visit Number 1    Number of Visits 2    Date for PT Re-Evaluation 05/05/22    PT Start Time 0949    PT Stop Time 1015    PT Time Calculation (min) 26 min    Activity Tolerance Patient tolerated treatment well    Behavior During Therapy [facility] for tasks assessed/performed             Past Medical History:  Diagnosis Date   Allergic rhinitis    Anemia    Anxiety    Constipation    GERD (gastroesophageal reflux disease)    Gestational diabetes    Headache(784.0)    Hypertension    IBS (irritable bowel syndrome)    Mitral valve prolapse    Neck pain    Palpitations    Past Surgical History:  Procedure Laterality Date   c section x 1     COLONOSCOPY  2006   Dr. Isa Rankin    LAPAROSCOPY     surgery to remove ovarian cyst     Patient Active Problem List   Diagnosis Date Noted   Malignant neoplasm of upper-inner quadrant of left breast in female, estrogen receptor positive ([place]) 11/03/2021   Mitral valve prolapse 12/03/2015   Anxiety 05/08/2015   Essential hypertension 05/08/2015   Genetic testing 11/23/2013   Family history of breast cancer in first degree relative 08/09/2012   Vaginal atrophy 08/09/2012   Menopause syndrome 08/09/2012   Dyspepsia and other specified disorders of function of stomach 05/03/2012   History of neurological disorder 04/04/2012   Overweight(278.02) 07/05/2011   Atrophic vaginitis 07/05/2011   Family history of breast cancer in mother 07/05/2011   Recurrent yeast infection 07/02/2011   ANEMIA 12/25/2008   CONSTIPATION 12/25/2008   NECK PAIN 12/25/2008   HEADACHE 12/25/2008   PALPITATIONS 12/25/2008   ANXIETY 03/24/2007   ALLERGIC RHINITIS 03/24/2007   GERD 03/24/2007   IRRITABLE BOWEL SYNDROME 03/24/2007   GESTATIONAL  DIABETES 03/24/2007   MITRAL VALVE PROLAPSE, HX OF 03/24/2007    REFERRING PROVIDER: Dr. [name]  REFERRING DIAG: Left breast cancer  THERAPY DIAG:  Malignant neoplasm of lower-outer quadrant of left breast of female, estrogen receptor positive ([place])  Abnormal posture  Rationale for Evaluation and Treatment: Rehabilitation  ONSET DATE: 10/10/2021  SUBJECTIVE:  SUBJECTIVE STATEMENT: Patient reports she is here today to be seen by her medical team for her newly diagnosed left breast cancer.   PERTINENT HISTORY:  Patient was diagnosed on 10/10/2021 with left grade 2 invasive ductal carcinoma breast cancer. It measures 1.2 cm and is located in the upper inner quadrant. It is ER/PR positive and HER2 negative with a Ki67 of 10%.   PATIENT GOALS:   reduce lymphedema risk and learn post op HEP.   PAIN:  Are you having pain? No  PRECAUTIONS: Active CA  HAND DOMINANCE: right  WEIGHT BEARING RESTRICTIONS: No  FALLS:  Has patient fallen in last 6 months? 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  LEISURE: Walks 30-45 min several days/week at the [place]: Independent   OBJECTIVE:  COGNITION: Overall cognitive status: Within functional limits for tasks assessed    POSTURE:  Forward head and rounded shoulders posture  UPPER EXTREMITY AROM/PROM:  A/PROM RIGHT   eval   Shoulder extension 52  Shoulder flexion 151  Shoulder abduction 156  Shoulder internal rotation 70  Shoulder external rotation 87    (Blank rows = not tested)  A/PROM LEFT   eval  Shoulder extension 67   Shoulder flexion 140  Shoulder abduction 145  Shoulder internal rotation 78  Shoulder external rotation 81    (Blank rows = not tested)   CERVICAL AROM: All within normal limits  UPPER EXTREMITY STRENGTH: WNL  LYMPHEDEMA ASSESSMENTS:   LANDMARK RIGHT   eval  10 cm proximal to olecranon process 24.2  Olecranon process 22.5  10 cm proximal to ulnar styloid process 20.6  Just proximal to ulnar styloid process 15.1  Across hand at thumb web space 18.2  At base of 2nd digit 6.8  (Blank rows = not tested)  LANDMARK LEFT   eval  10 cm proximal to olecranon process 25.4  Olecranon process 22.4  10 cm proximal to ulnar styloid process 20.4  Just proximal to ulnar styloid process 15.1  Across hand at thumb web space 17.8  At base of 2nd digit 6.7  (Blank rows = not tested)   L-DEX LYMPHEDEMA SCREENING:  The patient was assessed using the L-Dex machine today to produce a lymphedema index baseline score. The patient will be reassessed on a regular basis (typically every 3 months) to obtain new L-Dex scores. If the score is > 6.5 points away from his/her baseline score indicating onset of subclinical lymphedema, it will be recommended to wear a compression garment for 4 weeks, 12 hours per day and then be reassessed. If the score continues to be > 6.5 points from baseline at reassessment, we will initiate lymphedema treatment. Assessing in this manner has a 95% rate of preventing clinically significant lymphedema.   L-DEX FLOWSHEETS - 11/04/21 1100       L-DEX LYMPHEDEMA SCREENING   Measurement Type Unilateral    L-DEX MEASUREMENT EXTREMITY Upper Extremity    POSITION  Standing    DOMINANT SIDE Right    At Risk Side Left    BASELINE SCORE (UNILATERAL) 5              QUICK DASH SURVEY:  Katina Dung - 11/04/21 0001     Open a tight or new jar No difficulty    Do heavy household chores (wash walls,  wash floors) No difficulty    Carry a shopping bag or briefcase No  difficulty    Wash your back No difficulty    Use a knife to cut food No difficulty    Recreational activities in which you take some force or impact through your arm, shoulder, or hand (golf, hammering, tennis) No difficulty    During the past week, to what extent has your arm, shoulder or hand problem interfered with your normal social activities with family, friends, neighbors, or groups? Not at all    During the past week, to what extent has your arm, shoulder or hand problem limited your work or other regular daily activities Not at all    Arm, shoulder, or hand pain. None    Tingling (pins and needles) in your arm, shoulder, or hand None    Difficulty Sleeping No difficulty    DASH Score 0 %              PATIENT EDUCATION:  Education details: Lymphedema risk reduction and post op shoulder/posture HEP Person educated: Patient Education method: Explanation, Demonstration, Handout Education comprehension: Patient verbalized understanding and returned demonstration  HOME EXERCISE PROGRAM: Patient was instructed today in a home exercise program today for post op shoulder range of motion. These included active assist shoulder flexion in sitting, scapular retraction, wall walking with shoulder abduction, and hands behind head external rotation.  She was encouraged to do these twice a day, holding 3 seconds and repeating 5 times when permitted by her physician.   ASSESSMENT:  CLINICAL IMPRESSION: Patient was diagnosed on 10/10/2021 with left grade 2 invasive ductal carcinoma breast cancer. It measures 1.2 cm and is located in the upper inner quadrant. It is ER/PR positive and HER2 negative with a Ki67 of 10%. Her multidisciplinary medical team met prior to her assessments to determine a recommended treatment plan. She is planning to have a left lumpectomy and sentinel node biopsy followed by Oncotype testing, radiation, and anti-estrogen therapy. She will benefit from a post op PT  reassessment to determine needs and from L-Dex screens every 3 months for 2 years to detect subclinical lymphedema.  Pt will benefit from skilled therapeutic intervention to improve on the following deficits: Decreased knowledge of precautions, impaired UE functional use, pain, decreased ROM, postural dysfunction.   PT treatment/interventions: ADL/self-care home management, pt/family education, therapeutic exercise  REHAB POTENTIAL: Excellent  CLINICAL DECISION MAKING: Stable/uncomplicated  EVALUATION COMPLEXITY: Low   GOALS: Goals reviewed with patient? YES  LONG TERM GOALS: (STG=LTG)    Name Target Date Goal status  1 Pt will be able to verbalize understanding of pertinent lymphedema risk reduction practices relevant to her dx specifically related to skin care.  Baseline:  No knowledge 11/04/2021 Achieved at eval  2 Pt will be able to return demo and/or verbalize understanding of the post op HEP related to regaining shoulder ROM. Baseline:  No knowledge 11/04/2021 Achieved at eval  3 Pt will be able to verbalize understanding of the importance of attending the post op After Breast CA Class for further lymphedema risk reduction education and therapeutic exercise.  Baseline:  No knowledge 11/04/2021 Achieved at eval  4 Pt will demo she has regained full shoulder ROM and function post operatively compared to baselines.  Baseline: See objective measurements taken today. 05/05/2022      PLAN:  PT FREQUENCY/DURATION: EVAL and 1 follow up appointment.   PLAN FOR NEXT SESSION: will reassess 3-4 weeks post op to determine needs.  Patient will follow up at outpatient cancer rehab 3-4 weeks following surgery.  If the patient requires physical therapy at that time, a specific plan will be dictated and sent to the referring physician for approval. The patient was educated today on appropriate basic range of motion exercises to begin post operatively and the importance of attending the After  Breast Cancer class following surgery.  Patient was educated today on lymphedema risk reduction practices as it pertains to recommendations that will benefit the patient immediately following surgery.  She verbalized good understanding.    Physical Therapy Information for After Breast Cancer Surgery/Treatment:  Lymphedema is a swelling condition that you may be at risk for in your arm if you have lymph nodes removed from the armpit area.  After a sentinel node biopsy, the risk is approximately 5-9% and is higher after an axillary node dissection.  There is treatment available for this condition and it is not life-threatening.  Contact your physician or physical therapist with concerns. You may begin the 4 shoulder/posture exercises (see additional sheet) when permitted by your physician (typically a week after surgery).  If you have drains, you may need to wait until those are removed before beginning range of motion exercises.  A general recommendation is to not lift your arms above shoulder height until drains are removed.  These exercises should be done to your tolerance and gently.  This is not a "no pain/no gain" type of recovery so listen to your body and stretch into the range of motion that you can tolerate, stopping if you have pain.  If you are having immediate reconstruction, ask your plastic surgeon about doing exercises as he or she may want you to wait. We encourage you to attend the free one time ABC (After Breast Cancer) class offered by [facility].  You will learn information related to lymphedema risk, prevention and treatment and additional exercises to regain mobility following surgery.  You can call [phone] for more information.  This is offered the 1st and 3rd Monday of each month.  You only attend the class one time. While undergoing any medical procedure or treatment, try to avoid blood pressure being taken or needle sticks from occurring on the arm on the  side of cancer.   This recommendation begins after surgery and continues for the rest of your life.  This may help reduce your risk of getting lymphedema (swelling in your arm). An excellent resource for those seeking information on lymphedema is the National Lymphedema Network's web site. It can be accessed at [place].org If you notice swelling in your hand, arm or breast at any time following surgery (even if it is many years from now), please contact your doctor or physical therapist to discuss this.  Lymphedema can be treated at any time but it is easier for you if it is treated early on.  If you feel like your shoulder motion is not returning to normal in a reasonable amount of time, please contact your surgeon or physical therapist.  [facility] [phone]. [address], [address], [place] [address]  ABC CLASS After Breast Cancer Class  After Breast Cancer Class is a specially designed exercise class to assist you in a safe recover after having breast cancer surgery.  In this class you will learn how to get back to full function whether your drains were just removed or if you had surgery a month ago.  This one-time class is  held the 1st and 3rd Monday of every month from 11:00 a.m. until 12:00 noon virtually.  This class is FREE and space is limited. For more information or to register for the next available class, call [phone].  Class Goals  Understand specific stretches to improve the flexibility of you chest and shoulder. Learn ways to safely strengthen your upper body and improve your posture. Understand the warning signs of infection and why you may be at risk for an arm infection. Learn about Lymphedema and prevention.  ** You do not attend this class until after surgery.  Drains must be removed to participate  Patient was instructed today in a home exercise program today for post op shoulder range of motion. These included active  assist shoulder flexion in sitting, scapular retraction, wall walking with shoulder abduction, and hands behind head external rotation.  She was encouraged to do these twice a day, holding 3 seconds and repeating 5 times when permitted by her physician.   Annia Friendly, [place] 11/04/21 11:32 AM

## 2021-11-04 NOTE — Research (Signed)
[employer] 2021-05 - Specimen Collection Study to Evaluate Biomarkers in Subjects with Cancer   Patient Janice Gross was identified by Dr Chryl Heck as a potential candidate for the above listed study. This Clinical Research Nurse met with [name], [id number], on 11/04/21 in a manner and location that ensures patient privacy to discuss participation in the above listed research study. Patient is Unaccompanied. A copy of the informed consent document with embedded HIPAA language was provided to the patient. Patient reads, speaks, and understands [place].    Patient was provided with the business card of this Nurse and encouraged to contact the research team with any questions. Approximately 15 minutes were spent with the patient reviewing the informed consent documents. Patient was provided the option of taking informed consent documents home to review and was encouraged to review at their convenience with their support network, including other care providers. Patient took the consent documents home to review.  Patient agreed to follow-up from research staff on Monday, 11/6.  Vickii Penna, RN, BSN, CPN Clinical Research Nurse I [phone]  11/04/2021 11:49 AM

## 2021-11-04 NOTE — Progress Notes (Signed)
[facility] NOTE  Patient Care Team: Kelton Pillar, MD as PCP - General (Family Medicine) [name], MD as Consulting Physician (General Surgery) Benay Pike, MD as Consulting Physician (Hematology and Oncology) Kyung Rudd, MD as Consulting Physician (Radiation Oncology) Mauro Kaufmann, RN as Oncology Nurse Navigator Rockwell Germany, RN as Oncology Nurse Navigator  CHIEF COMPLAINTS/PURPOSE OF CONSULTATION:  Newly diagnosed breast cancer  HISTORY OF PRESENTING ILLNESS:  Janice Gross 66 y.o. female is here because of recent diagnosis of left breast cancer  I reviewed her records extensively and collaborated the history with the patient.  SUMMARY OF ONCOLOGIC HISTORY: Oncology History  Malignant neoplasm of upper-inner quadrant of left breast in female, estrogen receptor positive ([place])  10/10/2021 Mammogram   Screening mammogram showed loosely grouped calcifications in the right breast at 3:00 posterior depth, indeterminate, additional views are recommended.  Asymmetry in the left breast posterior depth superior region seen on the mediolateral oblique view only is indeterminate.  Diagnostic mammogram showed a 1 cm irregular mass with calcifications in the left breast, ultrasound was recommended.  Ultrasound confirmed a 1.2 x 0.5 cm irregular mass suspicious for malignancy.  No significant abnormalities in the axilla   10/29/2021 Pathology Results   Pathology results from the left breast biopsy showed invasive ductal carcinoma overall grade 2, prognostic showed ER 95% positive strong staining PR 40% positive strong staining, Ki-67 of 10% tumor cells are negative for HER2 1+ by [facility]   11/03/2021 Initial Diagnosis   Malignant neoplasm of upper-inner quadrant of left breast in female, estrogen receptor positive ([place])   11/04/2021 Cancer Staging   Staging form: Breast, AJCC 8th Edition - Clinical stage from 11/04/2021: Stage IA (cT1c, cN0, cM0, G2,  ER+, PR+, HER2-) - Signed by Hayden Pedro, PA-C on 11/04/2021 Stage prefix: Initial diagnosis Method of lymph node assessment: Clinical Histologic grading system: 3 grade system    Janice Gross is here for an initial visit by herself.  She has strong family history of breast cancer in mom, sisters but never had any genetic testing.  She had a screening mammogram which detected this abnormality.  She denies any palpable masses.  At baseline, she is very healthy except for hypertension for which she takes medication and history of mitral valve prolapse.  She denies any new complaints for [place].  Rest of the pertinent 10 point ROS reviewed and negative  MEDICAL HISTORY:  Past Medical History:  Diagnosis Date   Allergic rhinitis    Anemia    Anxiety    Constipation    GERD (gastroesophageal reflux disease)    Gestational diabetes    Headache(784.0)    Hypertension    IBS (irritable bowel syndrome)    Mitral valve prolapse    Neck pain    Palpitations     SURGICAL HISTORY: Past Surgical History:  Procedure Laterality Date   c section x 1     COLONOSCOPY  2006   Dr. Isa Rankin    LAPAROSCOPY     surgery to remove ovarian cyst      SOCIAL HISTORY: Social History   Socioeconomic History   Marital status: Divorced    Spouse name: Not on file   Number of children: 1   Years of education: Not on file   Highest education level: Not on file  Occupational History   Occupation: Marine scientist: [employer]  Tobacco Use   Smoking status: Never   Smokeless tobacco: Never  Tobacco comments:    tried as a teenager  Vaping Use   Vaping Use: Never used  Substance and Sexual Activity   Alcohol use: No    Alcohol/week: 0.0 standard drinks of alcohol   Drug use: No   Sexual activity: Not Currently    Birth control/protection: None  Other Topics Concern   Not on file  Social History Narrative   Work or School: Engineer, 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  and is very active - reports loves her job      Home Situation: lives alone      Spiritual Beliefs: Christian      Lifestyle:walking on a regular basis; diet is fair         Social Determinants of Radio broadcast assistant Strain: Not on file  Food Insecurity: Not on file  Transportation Needs: Not on file  Physical Activity: Not on file  Stress: Not on file  Social Connections: Not on file  Intimate Partner Violence: Not on file    FAMILY HISTORY: Family History  Problem Relation Age of Onset   Breast cancer Sister 24   Diabetes Sister    Breast cancer Sister 45   Diabetes Sister    Breast cancer Mother    Ovarian cancer Sister 26   Heart disease Father    Diabetes Brother    Lung cancer Brother    Breast cancer Other        dx in her 50s/dx in her 34s   Colon cancer Other 76   Lung cancer Other        dx in his 5s   Stomach cancer Neg Hx     ALLERGIES:  is allergic to aspirin and iodine.  MEDICATIONS:  Current Outpatient Medications  Medication Sig Dispense Refill   atenolol (TENORMIN) 50 MG tablet Take 1 tablet (50 mg total) by mouth daily. 90 tablet 3   Calcium Carbonate-Vitamin D (CALTRATE 600+D) 600-400 MG-UNIT per tablet Take 1 tablet by mouth daily.     cholecalciferol (VITAMIN D) 400 UNITS TABS Take 400 Units by mouth daily.     fish oil-omega-3 fatty acids 1000 MG capsule Take 2 g by mouth daily.     polyethylene glycol (MIRALAX / GLYCOLAX) packet Take 17 g by mouth daily.     Probiotic Product (PROBIOTIC PO) Take by mouth. Probiotic fiber powder     No current facility-administered medications for this visit.    REVIEW OF SYSTEMS:   Constitutional: Denies fevers, chills or abnormal night sweats Eyes: Denies blurriness of vision, double vision or watery eyes Ears, nose, mouth, throat, and face: Denies mucositis or sore throat Respiratory: Denies cough, dyspnea or wheezes Cardiovascular: Denies palpitation, chest discomfort or lower extremity  swelling Gastrointestinal:  Denies nausea, heartburn or change in bowel habits Skin: Denies abnormal skin rashes Lymphatics: Denies new lymphadenopathy or easy bruising Neurological:Denies numbness, tingling or new weaknesses Behavioral/Psych: Mood is stable, no new changes  Breast: Denies any palpable lumps or discharge All other systems were reviewed with the patient and are negative.  PHYSICAL EXAMINATION: ECOG PERFORMANCE STATUS: 0 - Asymptomatic  Vitals:   11/04/21 0900  BP: (!) 159/84  Pulse: 68  Resp: 18  Temp: 97.9 F (36.6 C)  SpO2: 100%   Filed Weights   11/04/21 0900  Weight: 146 lb 3.2 oz (66.3 kg)    GENERAL:alert, no distress and comfortable Neck: No palpable regional adenopathy. BREAST: There is a very subtle palpable left breast mass  in the upper inner quadrant at the biopsy site but this is not well-defined at all.  No other palpable masses.  No regional adenopathy  LABORATORY DATA:  I have reviewed the data as listed Lab Results  Component Value Date   WBC 2.9 (L) 11/04/2021   HGB 14.7 11/04/2021   HCT 42.6 11/04/2021   MCV 90.6 11/04/2021   PLT 200 11/04/2021   Lab Results  Component Value Date   NA 142 11/04/2021   K 3.9 11/04/2021   CL 107 11/04/2021   CO2 29 11/04/2021    RADIOGRAPHIC STUDIES: I have personally reviewed the radiological reports and agreed with the findings in the report.  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  We have discussed about upfront surgery followed by Oncotype testing given small tumor and favorable prognostics.  We have discussed the following details about Oncotype.  We have discussed  about Oncotype Dx score which is a well validated prognostic scoring system which can predict outcome with endocrine therapy alone and whether chemotherapy reduces recurrence.  Typically in patients with ER positive cancers that are node negative if the RS score is high typically greater than or equal to 26, chemotherapy is recommended.  In women with intermediate recurrence score younger than 70, there can still be some role for chemotherapy in addition to endocrine therapy especially if the recurrence score is between 21-25.  Given her favorable prognostics, it is more than likely that she would not need any adjuvant chemotherapy.  Following Oncotype testing, she may proceed with radiation if Oncotype confirms the above-mentioned and after radiation she will proceed with antiestrogen therapy.  I have discussed the following details about antiestrogen therapy.  We have discussed options for antiestrogen therapy today  With regards to Tamoxifen, we discussed that this is a SERM, selective estrogen receptor modulator. We discussed mechanism of action of Tamoxifen, adverse effects on Tamoxifen including but not limited to post menopausal symptoms, increased risk of DVT/PE, increased risk of endometrial cancer, questionable cataracts with long term use and increased risk of cardiovascular events in the study which was not statistically significant. A benefit from Tamoxifen would be improvement in bone density. With regards to aromatase inhibitors, we discussed mechanism of action, adverse effects including but not limited to post menopausal symptoms, arthralgias, myalgias, increased risk of cardiovascular events and bone loss.   I have given her printed information about tamoxifen and anastrozole.  At this time she is leaning towards anastrozole.  She will return to clinic after surgery to review additional recommendations.  We have discussed about genetic testing and she is agreeable.    All questions  were answered. The patient knows to call the clinic with any problems, questions or concerns.    Benay Pike, MD 11/04/21

## 2021-11-09 ENCOUNTER — Telehealth: Payer: Self-pay | Admitting: Hematology and Oncology

## 2021-11-09 NOTE — Telephone Encounter (Signed)
Called patient per 11/6 in basket. Patient aware of upcoming appointments.

## 2021-11-10 DIAGNOSIS — C50212 Malignant neoplasm of upper-inner quadrant of left female breast: Secondary | ICD-10-CM | POA: Diagnosis not present

## 2021-11-10 DIAGNOSIS — Z170 Estrogen receptor positive status [ER+]: Secondary | ICD-10-CM | POA: Diagnosis not present

## 2021-11-10 NOTE — Pre-Procedure Instructions (Signed)
Surgical Instructions    Your procedure is scheduled on Thursday, November 9.  Report to [facility] Main Entrance "A" at 7:00 A.M., then check in with the Admitting office.  Call this number if you have problems the morning of surgery:  [phone]   If you have any questions prior to your surgery date call [phone]: Open Monday-Friday 8am-4pm If you experience any cold or flu symptoms such as cough, fever, chills, shortness of breath, etc. between now and your scheduled surgery, please notify us at the above number     Remember:  Do not eat after midnight the night before your surgery  You may drink clear liquids until 6:00AM the morning of your surgery.   Clear liquids allowed are: Water, Non-Citrus Juices (without pulp), Carbonated Beverages, Clear Tea, Black Coffee ONLY (NO MILK, CREAM OR POWDERED CREAMER of any kind), and Gatorade  Patient Instructions  The night before surgery:  No food after midnight. ONLY clear liquids after midnight  The day of surgery (if you do NOT have diabetes):  Drink ONE (1) Pre-Surgery Clear Ensure by 6:00AM the morning of surgery. Drink in one sitting. Do not sip.  This drink was given to you during your hospital  pre-op appointment visit.  Nothing else to drink after completing the  Pre-Surgery Clear Ensure.  The day of surgery (if you have diabetes): Drink ONE (1) 12 oz G2 given to you in your pre admission testing appointment by 6:00AM the morning of surgery. Drink in one sitting. Do not sip.  This drink was given to you during your hospital  pre-op appointment visit.  Nothing else to drink after completing the  12 oz bottle of G2.         If you have questions, please contact your surgeon's office.     Take these medicines the morning of surgery with A SIP OF WATER:  atenolol (TENORMIN) Naphazoline-Pheniramine (OPCON-A OP)  if needed   As of today, STOP taking any Aspirin (unless otherwise instructed by your surgeon) Aleve,  Naproxen, Ibuprofen, Motrin, Advil, Goody's, BC's, all herbal medications, fish oil, and all vitamins.   [facility] is not responsible for any belongings or valuables.    Do NOT Smoke (Tobacco/Vaping)  24 hours prior to your procedure  If you use a CPAP at night, you may bring your mask for your overnight stay.   Contacts, glasses, hearing aids, dentures or partials may not be worn into surgery, please bring cases for these belongings   For patients admitted to the hospital, discharge time will be determined by your treatment team.   Patients discharged the day of surgery will not be allowed to drive home, and someone needs to stay with them for 24 hours.   SURGICAL WAITING ROOM VISITATION Patients having surgery or a procedure may have no more than 2 support people in the waiting area - these visitors may rotate.   Children under the age of 1 must have an adult with them who is not the patient. If the patient needs to stay at the hospital during part of their recovery, the visitor guidelines for inpatient rooms apply. Pre-op nurse will coordinate an appropriate time for 1 support person to accompany patient in pre-op.  This support person may not rotate.   Please refer to [email or web address] for the visitor guidelines for Inpatients (after your surgery is over and you are in a regular room).    Special instructions:    Oral Hygiene is also important to reduce  your risk of infection.  Remember - BRUSH YOUR TEETH THE MORNING OF SURGERY WITH YOUR REGULAR TOOTHPASTE   [facility]- Preparing For Surgery  Before surgery, you can play an important role. Because skin is not sterile, your skin needs to be as free of germs as possible. You can reduce the number of germs on your skin by washing with CHG (chlorahexidine gluconate) Soap before surgery.  CHG is an antiseptic cleaner which kills germs and bonds with the skin to continue killing  germs even after washing.     Please do not use if you have an allergy to CHG or antibacterial soaps. If your skin becomes reddened/irritated stop using the CHG.  Do not shave (including legs and underarms) for at least 48 hours prior to first CHG shower. It is OK to shave your face.  Please follow these instructions carefully.     Shower the NIGHT BEFORE SURGERY and the MORNING OF SURGERY with CHG Soap.   If you chose to wash your hair, wash your hair first as usual with your normal shampoo. After you shampoo, rinse your hair and body thoroughly to remove the shampoo.  Then [employer] and genitals (private parts) with your normal soap and rinse thoroughly to remove soap.  After that Use CHG Soap as you would any other liquid soap. You can apply CHG directly to the skin and wash gently with a scrungie or a clean washcloth.   Apply the CHG Soap to your body ONLY FROM THE NECK DOWN.  Do not use on open wounds or open sores. Avoid contact with your eyes, ears, mouth and genitals (private parts). Wash Face and genitals (private parts)  with your normal soap.   Wash thoroughly, paying special attention to the area where your surgery will be performed.  Thoroughly rinse your body with warm water from the neck down.  DO NOT shower/wash with your normal soap after using and rinsing off the CHG Soap.  Pat yourself dry with a CLEAN TOWEL.  Wear CLEAN PAJAMAS to bed the night before surgery  Place CLEAN SHEETS on your bed the night before your surgery  DO NOT SLEEP WITH PETS.   Day of Surgery:  Take a shower with CHG soap. Wear Clean/Comfortable clothing the morning of surgery Do not wear jewelry or makeup. Do not wear lotions, powders, perfumes/cologne or deodorant. Do not shave 48 hours prior to surgery.  Men may shave face and neck. Do not bring valuables to the hospital. Do not wear nail polish, gel polish, artificial nails, or any other type of covering on natural nails (fingers and  toes) If you have artificial nails or gel coating that need to be removed by a nail salon, please have this removed prior to surgery. Artificial nails or gel coating may interfere with anesthesia's ability to adequately monitor your vital signs.  Remember to brush your teeth WITH YOUR REGULAR TOOTHPASTE.    If you received a COVID test during your pre-op visit, it is requested that you wear a mask when out in public, stay away from anyone that may not be feeling well, and notify your surgeon if you develop symptoms. If you have been in contact with anyone that has tested positive in the last 10 days, please notify your surgeon.    Please read over the following fact sheets that you were given.

## 2021-11-11 ENCOUNTER — Other Ambulatory Visit: Payer: Self-pay

## 2021-11-11 ENCOUNTER — Encounter (HOSPITAL_COMMUNITY): Payer: Self-pay

## 2021-11-11 ENCOUNTER — Encounter (HOSPITAL_COMMUNITY)
Admission: RE | Admit: 2021-11-11 | Discharge: 2021-11-11 | Disposition: A | Discharge status: Home / Self Care | Payer: Medicare HMO | Source: Ambulatory Visit | Attending: Surgery | Admitting: Surgery

## 2021-11-11 DIAGNOSIS — C50212 Malignant neoplasm of upper-inner quadrant of left female breast: Secondary | ICD-10-CM | POA: Diagnosis not present

## 2021-11-11 DIAGNOSIS — Z01818 Encounter for other preprocedural examination: Secondary | ICD-10-CM | POA: Diagnosis not present

## 2021-11-11 HISTORY — DX: Myoneural disorder, unspecified: G70.9

## 2021-11-11 HISTORY — DX: Family history of other specified conditions: Z84.89

## 2021-11-11 NOTE — Progress Notes (Addendum)
PCP - [facility]  PPM/ICD - N/A   Chest x-ray - N/A EKG - 08/11/21 Stress Test - denies ECHO - denies Cardiac Cath - denies  Sleep Study - denies   Fasting Blood Sugar - N/A   Last dose of GLP1 agonist-  N/A GLP1 instructions: N/A  Blood Thinner Instructions: N/A Aspirin Instructions:N/A  ERAS Protcol -ERAS + ensure   COVID TEST- N/A   Anesthesia review: I spoke with Janice Caldwell, PA during PAT visit. Pt has seed placement at 12:00PM today. WBC recently 2.9 on 11/04/21. Pt with hx listed of MVP. Pt recently saw Dr Daneen Schick for a strange feeling in her chest with no further cardiac testing. Pt states she occasionally gets a "twinge" in her chest.  Per Janice Gross, no further labs needed today.   Patient denies shortness of breath, fever, cough and chest pain at PAT appointment   All instructions explained to the patient, with a verbal understanding of the material. Patient agrees to go over the instructions while at home for a better understanding. Patient also instructed to self quarantine after being tested for COVID-19. The opportunity to ask questions was provided.

## 2021-11-11 NOTE — H&P (Signed)
REFERRING PHYSICIAN: Mel Almond*  PROVIDER: Beverlee Nims, MD  MRN: [id number] DOB: 1955-06-04 DATE OF ENCOUNTER: 11/04/2021 Subjective  Chief Complaint: Breast Cancer   History of Present Illness: Janice Gross is a 66 y.o. female who is seen today as an office consultation for evaluation of Breast Cancer .  This is a 66 year old female was found on screening mammography to have an abnormality in the left breast. She underwent further imaging including ultrasound showing a 1.2 cm x 0.5 cm mass at the 10 o'clock position of the left breast 11 cm from the nipple. There is surrounding calcifications with a total site measuring almost 4 cm. Sound the axilla was negative. A biopsy of the mass showed invasive ductal carcinoma which was grade 2. It was 95% ER positive, 40% PR positive, HER2 negative, and had a Ki-67 of 10%. She has had no previous problems regarding her breast. She has had negative genetics in the past. She denies nipple discharge. She has no cardiopulmonary issues.  Review of Systems: A complete review of systems was obtained from the patient. I have reviewed this information and discussed as appropriate with the patient. See HPI as well for other ROS.  ROS  Medical History: Past Medical History: Diagnosis Date Anemia Anxiety GERD (gastroesophageal reflux disease) History of cancer  Patient Active Problem List Diagnosis Esophageal reflux Family history of malignant neoplasm of breast Malignant neoplasm of upper-inner quadrant of left breast in female, estrogen receptor positive Palpitations  Past Surgical History: Procedure Laterality Date CESAREAN SECTION Date Unknown COLONOSCOPY N/A 2006 LAPAROSCOPY DIAGNOSTIC N/A Date Unknown Surgery to remove Ovarian Cyst N/A Date Unknown   Allergies Allergen Reactions Aspirin Other (See Comments) Makes her lower back hurt Iodine Other (See Comments) shaking  Current Outpatient  Medications on File Prior to Visit Medication Sig Dispense Refill atenoloL (TENORMIN) 25 MG tablet cholecalciferol (VITAMIN D3) 400 unit tablet Take by mouth omega-3-dha-epa-fish oil 300-1,000 mg capsule Take by mouth polyethylene glycol (MIRALAX) packet Take by mouth  No current facility-administered medications on file prior to visit.  Family History Problem Relation Age of Onset Coronary Artery Disease (Blocked arteries around heart) Father Diabetes Sister Breast cancer Sister Ovarian cancer Sister Diabetes Brother Lung cancer Brother   Social History  Tobacco Use Smoking Status Unknown Smokeless Tobacco Not on file   Social History  Socioeconomic History Marital status: Married Tobacco Use Smoking status: Unknown Vaping Use Vaping Use: Unknown Substance and Sexual Activity Alcohol use: Defer Drug use: Defer  Objective: There were no vitals filed for this visit. There is no height or weight on file to calculate BMI.  Physical Exam  She appears well on exam  There are no palpable breast masses. The nipple areolar complex is normal.  There is no axillary adenopathy  Labs, Imaging and Diagnostic Testing: I have reviewed her mammograms, ultrasound, and pathology results  Assessment and Plan:  Diagnoses and all orders for this visit:  Invasive ductal carcinoma of breast, left (CMS-HCC)    We have discussed her at our multidisciplinary breast cancer conference. I have discussed the diagnosis with her in detail. From a surgical standpoint regarding breast cancer, we then discussed breast conservation versus mastectomy. She is even interested in breast conservation and potentially reduction. From a surgical standpoint I would then recommend proceeding with a radioactive seed guided bracketed left breast lumpectomy with 2 seeds and a sentinel lymph node biopsy. We discussed the reasons to biopsy lymph nodes to rule out the spread of cancer.  I discussed the  surgical procedures with her in detail. We discussed the risks which includes but is not limited to bleeding, infection, injury to surrounding structures, seroma formation postoperatively, the need for further surgery if the lymph nodes or margins are positive, cardiopulmonary issues, DVT, etc.  She will be referred to plastic surgery to consider reduction surgery. Given the bracketed lumpectomy, this would likely be a staged procedure to make sure margins are negative.  She understands and agrees with the plans.

## 2021-11-12 ENCOUNTER — Ambulatory Visit (HOSPITAL_COMMUNITY)
Admission: RE | Admit: 2021-11-12 | Discharge: 2021-11-12 | Disposition: A | Discharge status: Home / Self Care | Payer: Medicare HMO | Source: Ambulatory Visit | Attending: Surgery | Admitting: Surgery

## 2021-11-12 ENCOUNTER — Ambulatory Visit (HOSPITAL_BASED_OUTPATIENT_CLINIC_OR_DEPARTMENT_OTHER): Payer: Medicare HMO | Admitting: Anesthesiology

## 2021-11-12 ENCOUNTER — Encounter (HOSPITAL_COMMUNITY)
Admission: RE | Disposition: A | Discharge status: Home / Self Care | Payer: Self-pay | Source: Ambulatory Visit | Attending: Surgery

## 2021-11-12 ENCOUNTER — Ambulatory Visit (HOSPITAL_COMMUNITY): Payer: Medicare HMO | Admitting: Physician Assistant

## 2021-11-12 ENCOUNTER — Encounter: Payer: Self-pay | Admitting: *Deleted

## 2021-11-12 ENCOUNTER — Telehealth: Payer: Self-pay | Admitting: *Deleted

## 2021-11-12 ENCOUNTER — Other Ambulatory Visit: Payer: Self-pay

## 2021-11-12 DIAGNOSIS — K219 Gastro-esophageal reflux disease without esophagitis: Secondary | ICD-10-CM | POA: Diagnosis not present

## 2021-11-12 DIAGNOSIS — I10 Essential (primary) hypertension: Secondary | ICD-10-CM | POA: Insufficient documentation

## 2021-11-12 DIAGNOSIS — R920 Mammographic microcalcification found on diagnostic imaging of breast: Secondary | ICD-10-CM | POA: Diagnosis not present

## 2021-11-12 DIAGNOSIS — G8918 Other acute postprocedural pain: Secondary | ICD-10-CM | POA: Diagnosis not present

## 2021-11-12 DIAGNOSIS — Z79899 Other long term (current) drug therapy: Secondary | ICD-10-CM | POA: Insufficient documentation

## 2021-11-12 DIAGNOSIS — C50212 Malignant neoplasm of upper-inner quadrant of left female breast: Secondary | ICD-10-CM | POA: Diagnosis not present

## 2021-11-12 DIAGNOSIS — Z803 Family history of malignant neoplasm of breast: Secondary | ICD-10-CM | POA: Diagnosis not present

## 2021-11-12 DIAGNOSIS — Z170 Estrogen receptor positive status [ER+]: Secondary | ICD-10-CM | POA: Diagnosis not present

## 2021-11-12 DIAGNOSIS — C50912 Malignant neoplasm of unspecified site of left female breast: Secondary | ICD-10-CM | POA: Diagnosis not present

## 2021-11-12 DIAGNOSIS — N6012 Diffuse cystic mastopathy of left breast: Secondary | ICD-10-CM | POA: Diagnosis not present

## 2021-11-12 DIAGNOSIS — E119 Type 2 diabetes mellitus without complications: Secondary | ICD-10-CM | POA: Insufficient documentation

## 2021-11-12 HISTORY — PX: BREAST LUMPECTOMY WITH RADIOACTIVE SEED AND SENTINEL LYMPH NODE BIOPSY: SHX6550

## 2021-11-12 SURGERY — BREAST LUMPECTOMY WITH RADIOACTIVE SEED AND SENTINEL LYMPH NODE BIOPSY
Anesthesia: General | Site: Breast | Laterality: Left

## 2021-11-12 MED ORDER — EPHEDRINE SULFATE-NACL 50-0.9 MG/10ML-% IV SOSY
PREFILLED_SYRINGE | INTRAVENOUS | Status: DC | PRN
  Administered 2021-11-12: 10 mg via INTRAVENOUS

## 2021-11-12 MED ORDER — 0.9 % SODIUM CHLORIDE (POUR BTL) OPTIME
TOPICAL | Status: DC | PRN
  Administered 2021-11-12: 1000 mL

## 2021-11-12 MED ORDER — FENTANYL CITRATE (PF) 100 MCG/2ML IJ SOLN
INTRAMUSCULAR | Status: AC
  Filled 2021-11-12: qty 2

## 2021-11-12 MED ORDER — MIDAZOLAM HCL 2 MG/2ML IJ SOLN
INTRAMUSCULAR | Status: AC
  Filled 2021-11-12: qty 2

## 2021-11-12 MED ORDER — DEXAMETHASONE SODIUM PHOSPHATE 10 MG/ML IJ SOLN
INTRAMUSCULAR | Status: DC | PRN
  Administered 2021-11-12: 5 mg via INTRAVENOUS

## 2021-11-12 MED ORDER — MAGTRACE LYMPHATIC TRACER
INTRAMUSCULAR | Status: DC | PRN
  Administered 2021-11-12: 2 mL via INTRAMUSCULAR

## 2021-11-12 MED ORDER — ROPIVACAINE HCL 5 MG/ML IJ SOLN
INTRAMUSCULAR | Status: DC | PRN
  Administered 2021-11-12: 30 mL via PERINEURAL

## 2021-11-12 MED ORDER — CHLORHEXIDINE GLUCONATE CLOTH 2 % EX PADS: 6.0000 | MEDICATED_PAD | Freq: Once | CUTANEOUS | Status: DC

## 2021-11-12 MED ORDER — LIDOCAINE 2% (20 MG/ML) 5 ML SYRINGE
INTRAMUSCULAR | Status: DC | PRN
  Administered 2021-11-12: 100 mg via INTRAVENOUS

## 2021-11-12 MED ORDER — CEFAZOLIN SODIUM-DEXTROSE 2-4 GM/100ML-% IV SOLN
2.0000 g | INTRAVENOUS | Status: AC
  Administered 2021-11-12: 2 g via INTRAVENOUS

## 2021-11-12 MED ORDER — FENTANYL CITRATE (PF) 250 MCG/5ML IJ SOLN
INTRAMUSCULAR | Status: AC
  Filled 2021-11-12: qty 5

## 2021-11-12 MED ORDER — TRAMADOL HCL 50 MG PO TABS: 50.0000 mg | ORAL_TABLET | Freq: Four times a day (QID) | ORAL | 0 refills | Status: DC | PRN

## 2021-11-12 MED ORDER — MIDAZOLAM HCL 2 MG/2ML IJ SOLN
1.0000 mg | Freq: Once | INTRAMUSCULAR | Status: AC
  Administered 2021-11-12: 1 mg via INTRAVENOUS
  Filled 2021-11-12: qty 1

## 2021-11-12 MED ORDER — OXYCODONE HCL 5 MG PO TABS: 5.0000 mg | ORAL_TABLET | Freq: Once | ORAL | Status: DC | PRN

## 2021-11-12 MED ORDER — FENTANYL CITRATE PF 50 MCG/ML IJ SOSY
50.0000 ug | PREFILLED_SYRINGE | Freq: Once | INTRAMUSCULAR | Status: AC
  Administered 2021-11-12: 50 ug via INTRAVENOUS
  Filled 2021-11-12: qty 1

## 2021-11-12 MED ORDER — LACTATED RINGERS IV SOLN: INTRAVENOUS | Status: DC

## 2021-11-12 MED ORDER — ACETAMINOPHEN 500 MG PO TABS
ORAL_TABLET | ORAL | Status: AC
  Administered 2021-11-12: 1000 mg via ORAL
  Filled 2021-11-12: qty 2

## 2021-11-12 MED ORDER — BUPIVACAINE-EPINEPHRINE 0.25% -1:200000 IJ SOLN
INTRAMUSCULAR | Status: DC | PRN
  Administered 2021-11-12: 22 mL

## 2021-11-12 MED ORDER — ORAL CARE MOUTH RINSE: 15.0000 mL | Freq: Once | OROMUCOSAL | Status: AC

## 2021-11-12 MED ORDER — GLYCOPYRROLATE PF 0.2 MG/ML IJ SOSY
PREFILLED_SYRINGE | INTRAMUSCULAR | Status: DC | PRN
  Administered 2021-11-12: .1 mg via INTRAVENOUS

## 2021-11-12 MED ORDER — ONDANSETRON HCL 4 MG/2ML IJ SOLN
INTRAMUSCULAR | Status: DC | PRN
  Administered 2021-11-12: 4 mg via INTRAVENOUS

## 2021-11-12 MED ORDER — FENTANYL CITRATE (PF) 100 MCG/2ML IJ SOLN: 25.0000 ug | INTRAMUSCULAR | Status: DC | PRN

## 2021-11-12 MED ORDER — CHLORHEXIDINE GLUCONATE 0.12 % MT SOLN: 15.0000 mL | Freq: Once | OROMUCOSAL | Status: AC

## 2021-11-12 MED ORDER — PROPOFOL 10 MG/ML IV BOLUS
INTRAVENOUS | Status: DC | PRN
  Administered 2021-11-12: 150 mg via INTRAVENOUS

## 2021-11-12 MED ORDER — AMISULPRIDE (ANTIEMETIC) 5 MG/2ML IV SOLN: 10.0000 mg | Freq: Once | INTRAVENOUS | Status: DC | PRN

## 2021-11-12 MED ORDER — ACETAMINOPHEN 500 MG PO TABS: 1000.0000 mg | ORAL_TABLET | Freq: Once | ORAL | Status: DC

## 2021-11-12 MED ORDER — CHLORHEXIDINE GLUCONATE 0.12 % MT SOLN
OROMUCOSAL | Status: AC
  Administered 2021-11-12: 15 mL via OROMUCOSAL
  Filled 2021-11-12: qty 15

## 2021-11-12 MED ORDER — CEFAZOLIN SODIUM-DEXTROSE 2-4 GM/100ML-% IV SOLN
INTRAVENOUS | Status: AC
  Filled 2021-11-12: qty 100

## 2021-11-12 MED ORDER — ENSURE PRE-SURGERY PO LIQD
296.0000 mL | Freq: Once | ORAL | Status: AC
  Administered 2021-11-12: 296 mL via ORAL

## 2021-11-12 MED ORDER — ACETAMINOPHEN 500 MG PO TABS: 1000.0000 mg | ORAL_TABLET | ORAL | Status: AC

## 2021-11-12 MED ORDER — OXYCODONE HCL 5 MG/5ML PO SOLN: 5.0000 mg | Freq: Once | ORAL | Status: DC | PRN

## 2021-11-12 MED ORDER — ONDANSETRON HCL 4 MG/2ML IJ SOLN: 4.0000 mg | Freq: Once | INTRAMUSCULAR | Status: DC | PRN

## 2021-11-12 MED ORDER — PHENYLEPHRINE HCL-NACL 20-0.9 MG/250ML-% IV SOLN
INTRAVENOUS | Status: DC | PRN
  Administered 2021-11-12: 30 ug/min via INTRAVENOUS

## 2021-11-12 SURGICAL SUPPLY — 36 items
ADH SKN CLS APL DERMABOND .7 (GAUZE/BANDAGES/DRESSINGS) ×1
APL PRP STRL LF DISP 70% ISPRP (MISCELLANEOUS) ×1
APPLIER CLIP 9.375 MED OPEN (MISCELLANEOUS) ×1
APR CLP MED 9.3 20 MLT OPN (MISCELLANEOUS) ×1
BAG COUNTER SPONGE SURGICOUNT (BAG) ×2 IMPLANT
BAG SPNG CNTER NS LX DISP (BAG) ×1
CANISTER SUCT 3000ML PPV (MISCELLANEOUS) ×2 IMPLANT
CHLORAPREP W/TINT 26 (MISCELLANEOUS) ×2 IMPLANT
CLIP APPLIE 9.375 MED OPEN (MISCELLANEOUS) ×2 IMPLANT
COVER PROBE W GEL 5X96 (DRAPES) ×4 IMPLANT
COVER SURGICAL LIGHT HANDLE (MISCELLANEOUS) ×2 IMPLANT
DERMABOND ADVANCED .7 DNX12 (GAUZE/BANDAGES/DRESSINGS) ×2 IMPLANT
DEVICE DUBIN SPECIMEN MAMMOGRA (MISCELLANEOUS) ×2 IMPLANT
DRAPE CHEST BREAST 15X10 FENES (DRAPES) ×2 IMPLANT
ELECT CAUTERY BLADE 6.4 (BLADE) ×2 IMPLANT
ELECT REM PT RETURN 9FT ADLT (ELECTROSURGICAL) ×1
ELECTRODE REM PT RTRN 9FT ADLT (ELECTROSURGICAL) ×2 IMPLANT
GLOVE SURG SIGNA 7.5 PF LTX (GLOVE) ×2 IMPLANT
GOWN STRL REUS W/ TWL LRG LVL3 (GOWN DISPOSABLE) ×2 IMPLANT
GOWN STRL REUS W/ TWL XL LVL3 (GOWN DISPOSABLE) ×2 IMPLANT
GOWN STRL REUS W/TWL LRG LVL3 (GOWN DISPOSABLE) ×1
GOWN STRL REUS W/TWL XL LVL3 (GOWN DISPOSABLE) ×1
KIT BASIN OR (CUSTOM PROCEDURE TRAY) ×2 IMPLANT
KIT MARKER MARGIN INK (KITS) ×2 IMPLANT
NDL 18GX1X1/2 (RX/OR ONLY) (NEEDLE) IMPLANT
NDL HYPO 25GX1X1/2 BEV (NEEDLE) ×2 IMPLANT
NEEDLE 18GX1X1/2 (RX/OR ONLY) (NEEDLE) ×1 IMPLANT
NEEDLE HYPO 25GX1X1/2 BEV (NEEDLE) ×1 IMPLANT
NS IRRIG 1000ML POUR BTL (IV SOLUTION) ×2 IMPLANT
PACK GENERAL/GYN (CUSTOM PROCEDURE TRAY) ×2 IMPLANT
SUT MNCRL AB 4-0 PS2 18 (SUTURE) ×2 IMPLANT
SUT VIC AB 3-0 SH 18 (SUTURE) ×2 IMPLANT
SYR CONTROL 10ML LL (SYRINGE) ×2 IMPLANT
TOWEL GREEN STERILE (TOWEL DISPOSABLE) ×2 IMPLANT
TOWEL GREEN STERILE FF (TOWEL DISPOSABLE) ×2 IMPLANT
TRACER MAGTRACE VIAL (MISCELLANEOUS) IMPLANT

## 2021-11-12 NOTE — Anesthesia Preprocedure Evaluation (Addendum)
Anesthesia Evaluation  Patient identified by MRN, date of birth, ID band Patient awake    Reviewed: Allergy & Precautions, NPO status , Patient's Chart, lab work & pertinent test results, reviewed documented beta blocker date and time   History of Anesthesia Complications Negative for: history of anesthetic complications  Airway Mallampati: II  TM Distance: >3 FB Neck ROM: Full    Dental  (+) Dental Advisory Given   Pulmonary neg pulmonary ROS   Pulmonary exam normal        Cardiovascular hypertension, Pt. on home beta blockers Normal cardiovascular exam     Neuro/Psych negative neurological ROS     GI/Hepatic Neg liver ROS,GERD  ,,  Endo/Other  diabetes    Renal/GU negative Renal ROS  negative genitourinary   Musculoskeletal negative musculoskeletal ROS (+)    Abdominal   Peds  Hematology negative hematology ROS (+)   Anesthesia Other Findings Left breast cancer  Reproductive/Obstetrics                             Anesthesia Physical Anesthesia Plan  ASA: 2  Anesthesia Plan: General   Post-op Pain Management: Regional block*, Tylenol PO (pre-op)* and Toradol IV (intra-op)*   Induction: Intravenous  PONV Risk Score and Plan: 3 and Ondansetron, Dexamethasone, Midazolam and Treatment may vary due to age or medical condition  Airway Management Planned: LMA  Additional Equipment: None  Intra-op Plan:   Post-operative Plan: Extubation in OR  Informed Consent: I have reviewed the patients History and Physical, chart, labs and discussed the procedure including the risks, benefits and alternatives for the proposed anesthesia with the patient or authorized representative who has indicated his/her understanding and acceptance.     Dental advisory given  Plan Discussed with:   Anesthesia Plan Comments:         Anesthesia Quick Evaluation

## 2021-11-12 NOTE — Op Note (Signed)
   Janice Gross 11/12/2021   Pre-op Diagnosis: LEFT BREAST CANCER     Post-op Diagnosis: same  Procedure(s): LEFT BREAST BRACKETED LUMPECTOMY WITH RADIOACTIVE SEED X2  DEEP LEFT AXILLARY LYMPH NODE BIOPSY INJECTION OF MAGTRACE FOR LYMPH NODE MAPPING  Surgeon(s): [name], MD  Anesthesia: General  Staff:  Circulator: Margy Clarks, RN Scrub Person: Lyndle Herrlich, CST; Verlene Mayer  Estimated Blood Loss: Minimal               Specimens: sent to path  Indications: This is a 66 year old female who was found to have an abnormality on screening mammography of her left breast.  Ultrasound confirmed a 1.2 cm mass and surrounding calcifications with a total area measuring approximately 4 cm.  The decision was made to proceed with a bracketed radioactive seed guided lumpectomy and sentinel lymph node biopsy.  Procedure: The patient was brought to operating identifies correct patient.  She was placed upon the operating table general anesthesia was induced.  I then injected mag trace underneath the left nipple areolar complex and massaged the breast.  Her left breast and axilla were then prepped and draped in usual sterile fashion.  The radioactive seeds were located with the aid of the neoprobe in the upper inner quadrant of the left breast more than 10 cm from the nipple areolar complex.  I anesthetized skin with Marcaine at this area and then made a longitudinal incision with a scalpel.  I then dissected down to the breast tissue with electrocautery.  With the aid of the neoprobe I stayed widely around both radioactive seeds going all the way down to the chest wall dissecting medial to lateral.  I then completed the large lumpectomy removing all the surrounding breast tissue.  I marked all margins with paint.  An x-ray was performed confirming that the 2 radioactive seeds and the original biopsy clip were all in the specimen.  The lumpectomy specimen was then sent to  pathology for evaluation.  I next anesthetized the skin in the axilla with Marcaine.  I made incision with a scalpel and then dissected down into the deep left axillary tissue.  With the aid of the mag trace probe I was then able to identify several sentinel lymph nodes which were removed en bloc together.  There appeared to be at least 3 lymph nodes in the specimen.  Once the nodes were removed I reexamined the axilla with the probe found no other increased uptake of mag trace.  There are also no other enlarged or palpable lymph nodes present.  Hemostasis was achieved in the axilla with several surgical clips and the cautery.  I next placed clips around the periphery of the lumpectomy cavity.  I then closed the subcutaneous tissue with interrupted 3-0 Vicryl sutures and closed the skin with a running 4-0 Monocryl.  I likewise closed the left axilla with interrupted 3-0 Vicryl sutures and a running 4-0 Monocryl as well.  Dermabond was then applied to both incisions.  The patient tolerated the procedure well.  All the counts were correct at the end of the procedure.  The patient was then placed in a breast binder.  She was then extubated in the operating room and taken in stable condition to the recovery room.          [name]   Date: 11/12/2021  Time: 10:19 AM

## 2021-11-12 NOTE — Interval H&P Note (Signed)
History and Physical Interval Note: no change in H and P  11/12/2021 8:29 AM  Janice Gross  has presented today for surgery, with the diagnosis of LEFT BREAST CANCER.  The various methods of treatment have been discussed with the patient and family. After consideration of risks, benefits and other options for treatment, the patient has consented to  Procedure(s) with comments: LEFT BREAST BRACKETED LUMPECTOMY WITH RADIOACTIVE SEED AND SENTINEL LYMPH NODE BIOPSY (Left) - 60 MIN ROOM 2 as a surgical intervention.  The patient's history has been reviewed, patient examined, no change in status, stable for surgery.  I have reviewed the patient's chart and labs.  Questions were answered to the patient's satisfaction.     [name]

## 2021-11-12 NOTE — Discharge Instructions (Signed)
[place] Office Phone Number [phone]  BREAST BIOPSY/ PARTIAL MASTECTOMY: POST OP INSTRUCTIONS  Always review your discharge instruction sheet given to you by the facility where your surgery was performed.  IF YOU HAVE DISABILITY OR FAMILY LEAVE FORMS, YOU MUST BRING THEM TO THE OFFICE FOR PROCESSING.  DO NOT GIVE THEM TO YOUR DOCTOR.  A prescription for pain medication may be given to you upon discharge.  Take your pain medication as prescribed, if needed.  If narcotic pain medicine is not needed, then you may take acetaminophen (Tylenol) or ibuprofen (Advil) as needed. Take your usually prescribed medications unless otherwise directed If you need a refill on your pain medication, please contact your pharmacy.  They will contact our office to request authorization.  Prescriptions will not be filled after 5pm or on week-ends. You should eat very light the first 24 hours after surgery, such as soup, crackers, pudding, etc.  Resume your normal diet the day after surgery. Most patients will experience some swelling and bruising in the breast.  Ice packs and a good support bra will help.  Swelling and bruising can take several days to resolve.  It is common to experience some constipation if taking pain medication after surgery.  Increasing fluid intake and taking a stool softener will usually help or prevent this problem from occurring.  A mild laxative (Milk of Magnesia or Miralax) should be taken according to package directions if there are no bowel movements after 48 hours. Unless discharge instructions indicate otherwise, you may remove your bandages 24-48 hours after surgery, and you may shower at that time.  You may have steri-strips (small skin tapes) in place directly over the incision.  These strips should be left on the skin for 7-10 days.  If your surgeon used skin glue on the incision, you may shower in 24 hours.  The glue will flake off over the next 2-3 weeks.  Any  sutures or staples will be removed at the office during your follow-up visit. ACTIVITIES:  You may resume regular daily activities (gradually increasing) beginning the next day.  Wearing a good support bra or sports bra minimizes pain and swelling.  You may have sexual intercourse when it is comfortable. You may drive when you no longer are taking prescription pain medication, you can comfortably wear a seatbelt, and you can safely maneuver your car and apply brakes. RETURN TO WORK:  ______________________________________________________________________________________ Dennis Bast should see your doctor in the office for a follow-up appointment approximately two weeks after your surgery.  Your doctor's nurse will typically make your follow-up appointment when she calls you with your pathology report.  Expect your pathology report 2-3 business days after your surgery.  You may call to check if you do not hear from [place] after three days. OTHER INSTRUCTIONS: YOU MAY REMOVE THE BINDER AND SHOWER STARTING TOMORROW REPLACE THE BINDER AFTER SHOWERING ICE PACK, TYLENOL, AND IBUPROFEN ALSO FOR PAIN NO VIGOROUS ACTIVITY FOR ONE WEEK _______________________________________________________________________________________________ _____________________________________________________________________________________________________________________________________ _____________________________________________________________________________________________________________________________________ _____________________________________________________________________________________________________________________________________  WHEN TO CALL YOUR DOCTOR: Fever over 101.0 Nausea and/or vomiting. Extreme swelling or bruising. Continued bleeding from incision. Increased pain, redness, or drainage from the incision.  The clinic staff is available to answer your questions during regular business hours.  Please don't hesitate to  call and ask to speak to one of the nurses for clinical concerns.  If you have a medical emergency, go to the nearest emergency room or call 911.  A surgeon from [facility] is always on call  at the hospital.  For further questions, please visit [email or web address]

## 2021-11-12 NOTE — Anesthesia Procedure Notes (Signed)
Procedure Name: LMA Insertion Date/Time: 11/12/2021 9:24 AM  Performed by: Darletta Moll, CRNAPre-anesthesia Checklist: Patient identified, Emergency Drugs available, Suction available and Patient being monitored Patient Re-evaluated:Patient Re-evaluated prior to induction Oxygen Delivery Method: Circle system utilized Preoxygenation: Pre-oxygenation with 100% oxygen Induction Type: IV induction Ventilation: Mask ventilation without difficulty LMA: LMA inserted LMA Size: 3.0 Number of attempts: 1 Placement Confirmation: positive ETCO2, breath sounds checked- equal and bilateral and CO2 detector Tube secured with: Tape Dental Injury: Teeth and Oropharynx as per pre-operative assessment

## 2021-11-12 NOTE — Telephone Encounter (Signed)
Spoke with patient to follow up from [facility] and assess navigation needs. Patient denies any questions or concerns at this time.  She states she had her sx today and is recovering well. Appointments reviewed and encouraged her to call should she have any other questions or concerns. Patient verbalized understanding.

## 2021-11-12 NOTE — Anesthesia Procedure Notes (Signed)
Anesthesia Regional Block: Pectoralis block   Pre-Anesthetic Checklist: , timeout performed,  Correct Patient, Correct Site, Correct Laterality,  Correct Procedure, Correct Position, site marked,  Risks and benefits discussed,  Surgical consent,  Pre-op evaluation,  At surgeon's request and post-op pain management  Laterality: Left  Prep: chloraprep       Needles:  Injection technique: Single-shot  Needle Type: Echogenic Stimulator Needle     Needle Length: 10cm  Needle Gauge: 20     Additional Needles:   Procedures:,,,, ultrasound used (permanent image in chart),,    Narrative:  Start time: 11/12/2021 8:50 AM End time: 11/12/2021 8:54 AM Injection made incrementally with aspirations every 5 mL.  Performed by: Personally  Anesthesiologist: Lidia Collum, MD

## 2021-11-12 NOTE — Anesthesia Postprocedure Evaluation (Signed)
Anesthesia Post Note  Patient: Janice Gross  Procedure(s) Performed: LEFT BREAST BRACKETED LUMPECTOMY WITH RADIOACTIVE SEED X2 AND SENTINEL LYMPH NODE BIOPSY (Left: Breast)     Patient location during evaluation: PACU Anesthesia Type: General Level of consciousness: awake and alert Pain management: pain level controlled Vital Signs Assessment: post-procedure vital signs reviewed and stable Respiratory status: spontaneous breathing, nonlabored ventilation and respiratory function stable Cardiovascular status: blood pressure returned to baseline and stable Postop Assessment: no apparent nausea or vomiting Anesthetic complications: no   No notable events documented.  Last Vitals:  Vitals:   11/12/21 1040 11/12/21 1055  BP: (!) 150/67 139/83  Pulse: (!) 52 (!) 50  Resp: 12 13  Temp:  36.8 C  SpO2: 99% 100%    Last Pain:  Vitals:   11/12/21 1055  TempSrc:   PainSc: 0-No pain                 Lidia Collum

## 2021-11-12 NOTE — Transfer of Care (Signed)
Immediate Anesthesia Transfer of Care Note  Patient: Janice Gross  Procedure(s) Performed: LEFT BREAST BRACKETED LUMPECTOMY WITH RADIOACTIVE SEED X2 AND SENTINEL LYMPH NODE BIOPSY (Left: Breast)  Patient Location: PACU  Anesthesia Type:General  Level of Consciousness: drowsy and patient cooperative  Airway & Oxygen Therapy: Patient Spontanous Breathing and Patient connected to nasal cannula oxygen  Post-op Assessment: Report given to RN, Post -op Vital signs reviewed and stable, and Patient moving all extremities X 4  Post vital signs: Reviewed and stable  Last Vitals:  Vitals Value Taken Time  BP 162/84 11/12/21 1025  Temp    Pulse 64 11/12/21 1027  Resp 12 11/12/21 1027  SpO2 100 % 11/12/21 1027  Vitals shown include unvalidated device data.  Last Pain:  Vitals:   11/12/21 0827  TempSrc:   PainSc: 0-No pain         Complications: No notable events documented.

## 2021-11-13 ENCOUNTER — Encounter (HOSPITAL_COMMUNITY): Payer: Self-pay | Admitting: Surgery

## 2021-11-13 LAB — SURGICAL PATHOLOGY

## 2021-11-18 ENCOUNTER — Telehealth: Payer: Self-pay | Admitting: *Deleted

## 2021-11-18 ENCOUNTER — Encounter: Payer: Self-pay | Admitting: *Deleted

## 2021-11-18 NOTE — Telephone Encounter (Signed)
Received order for oncotype testing per Dr. Chryl Heck.

## 2021-11-24 ENCOUNTER — Telehealth: Payer: Self-pay | Admitting: *Deleted

## 2021-11-24 ENCOUNTER — Inpatient Hospital Stay: Payer: Medicare HMO | Admitting: Hematology and Oncology

## 2021-11-24 NOTE — Telephone Encounter (Signed)
This RN contacted pt per appt today per need to reschedule due to oncotype not resulted presently.  Appointment rescheduled to next week with pt verbalizing understanding.  No further needs at this time.

## 2021-11-24 NOTE — Progress Notes (Deleted)
[facility] NOTE  Patient Care Team: Pa, [place] as PCP - General [name], MD as Consulting Physician (General Surgery) Benay Pike, MD as Consulting Physician (Hematology and Oncology) Kyung Rudd, MD as Consulting Physician (Radiation Oncology) Mauro Kaufmann, RN as Oncology Nurse Navigator Rockwell Germany, RN as Oncology Nurse Navigator  CHIEF COMPLAINTS/PURPOSE OF CONSULTATION:  Newly diagnosed breast cancer  HISTORY OF PRESENTING ILLNESS:   Janice Gross 66 y.o. female is here because of recent diagnosis of left breast cancer  I reviewed her records extensively and collaborated the history with the patient.  SUMMARY OF ONCOLOGIC HISTORY: Oncology History  Malignant neoplasm of upper-inner quadrant of left breast in female, estrogen receptor positive ([place])  10/10/2021 Mammogram   Screening mammogram showed loosely grouped calcifications in the right breast at 3:00 posterior depth, indeterminate, additional views are recommended.  Asymmetry in the left breast posterior depth superior region seen on the mediolateral oblique view only is indeterminate.  Diagnostic mammogram showed a 1 cm irregular mass with calcifications in the left breast, ultrasound was recommended.  Ultrasound confirmed a 1.2 x 0.5 cm irregular mass suspicious for malignancy.  No significant abnormalities in the axilla   10/29/2021 Pathology Results   Pathology results from the left breast biopsy showed invasive ductal carcinoma overall grade 2, prognostic showed ER 95% positive strong staining PR 40% positive strong staining, Ki-67 of 10% tumor cells are negative for HER2 1+ by [facility]   11/03/2021 Initial Diagnosis   Malignant neoplasm of upper-inner quadrant of left breast in female, estrogen receptor positive ([place])   11/04/2021 Cancer Staging   Staging form: Breast, AJCC 8th Edition - Clinical stage from 11/04/2021: Stage IA (cT1c, cN0, cM0, G2,  ER+, PR+, HER2-) - Signed by Hayden Pedro, PA-C on 11/04/2021 Stage prefix: Initial diagnosis Method of lymph node assessment: Clinical Histologic grading system: 3 grade system    Ms. Diers is here for an initial visit by herself.  She has strong family history of breast cancer in mom, sisters but never had any genetic testing.  She had a screening mammogram which detected this abnormality.  She denies any palpable masses.  At baseline, she is very healthy except for hypertension for which she takes medication and history of mitral valve prolapse.  She denies any new complaints for [place].  Rest of the pertinent 10 point ROS reviewed and negative  MEDICAL HISTORY:  Past Medical History:  Diagnosis Date   Allergic rhinitis    Anemia    pt states she has never heard that she had this   Anxiety    Constipation    Family history of adverse reaction to anesthesia    niece is hard to put to sleep and "sensitive to anesthesia"   GERD (gastroesophageal reflux disease)    Gestational diabetes    Headache(784.0)    Hypertension    IBS (irritable bowel syndrome)    Mitral valve prolapse    Neck pain    Neuromuscular disorder ([place])    Pt reports takign FLU shot in the 90s which caused leg numbmess which has gone away, no tingling "from time to time"   Palpitations     SURGICAL HISTORY: Past Surgical History:  Procedure Laterality Date   BREAST LUMPECTOMY WITH RADIOACTIVE SEED AND SENTINEL LYMPH NODE BIOPSY Left 11/12/2021   Procedure: LEFT BREAST BRACKETED LUMPECTOMY WITH RADIOACTIVE SEED X2 AND SENTINEL LYMPH NODE BIOPSY;  Surgeon: [name], MD;  Location: [facility];  Service: General;  Laterality: Left;  20 MIN ROOM 2   c section x 1     COLONOSCOPY  2006   Dr. Isa Rankin    LAPAROSCOPY     surgery to remove ovarian cyst      SOCIAL HISTORY: Social History   Socioeconomic History   Marital status: Divorced    Spouse name: Not on file   Number of children: 1   Years  of education: Not on file   Highest education level: Not on file  Occupational History   Occupation: Marine scientist: [employer]  Tobacco Use   Smoking status: Never   Smokeless tobacco: Never   Tobacco comments:    tried as a teenager  Scientific laboratory technician Use: Never used  Substance and Sexual Activity   Alcohol use: No    Alcohol/week: 0.0 standard drinks of alcohol   Drug use: No   Sexual activity: Not Currently    Birth control/protection: None  Other Topics Concern   Not on file  Social History Narrative   Work or School: Engineer, 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 - reports loves her job      Home Situation: lives alone      Spiritual Beliefs: Christian      Lifestyle:walking on a regular basis; diet is fair         Social Determinants of Health   Financial Resource Strain: Low Risk  (11/04/2021)   Overall Financial Resource Strain (CARDIA)    Difficulty of Paying Living Expenses: Not very hard  Food Insecurity: No Food Insecurity (11/04/2021)   Hunger Vital Sign    Worried About Running Out of Food in the Last Year: Never true    [place] in the Last Year: Never true  Transportation Needs: No Transportation Needs (11/04/2021)   PRAPARE - Hydrologist (Medical): No    Lack of Transportation (Non-Medical): No  Physical Activity: Not on file  Stress: Not on file  Social Connections: Not on file  Intimate Partner Violence: Not on file    FAMILY HISTORY: Family History  Problem Relation Age of Onset   Breast cancer Sister 71   Diabetes Sister    Breast cancer Sister 5   Diabetes Sister    Breast cancer Mother    Ovarian cancer Sister 92   Heart disease Father    Diabetes Brother    Lung cancer Brother    Breast cancer Other        dx in her 50s/dx in her 6s   Colon cancer Other 44   Lung cancer Other        dx in his 102s   Stomach cancer Neg Hx     ALLERGIES:  is allergic  to aspirin, fluogen [influenza virus vaccine], iodine, and other.  MEDICATIONS:  Current Outpatient Medications  Medication Sig Dispense Refill   atenolol (TENORMIN) 25 MG tablet Take 25 mg by mouth daily.     atenolol (TENORMIN) 50 MG tablet Take 1 tablet (50 mg total) by mouth daily. (Patient not taking: Reported on 11/10/2021) 90 tablet 3   Calcium Carbonate-Vitamin D (CALTRATE 600+D) 600-400 MG-UNIT per tablet Take 1 tablet by mouth daily.     Multiple Vitamin (MULTIVITAMIN WITH MINERALS) TABS tablet Take 1 tablet by mouth 4 (four) times a week.     Naphazoline-Pheniramine (OPCON-A OP) Place 1 drop into both eyes daily as  needed (dry/irritated eyes).     traMADol (ULTRAM) 50 MG tablet Take 1 tablet (50 mg total) by mouth every 6 (six) hours as needed. 25 tablet 0   No current facility-administered medications for this visit.    REVIEW OF SYSTEMS:   Constitutional: Denies fevers, chills or abnormal night sweats Eyes: Denies blurriness of vision, double vision or watery eyes Ears, nose, mouth, throat, and face: Denies mucositis or sore throat Respiratory: Denies cough, dyspnea or wheezes Cardiovascular: Denies palpitation, chest discomfort or lower extremity swelling Gastrointestinal:  Denies nausea, heartburn or change in bowel habits Skin: Denies abnormal skin rashes Lymphatics: Denies new lymphadenopathy or easy bruising Neurological:Denies numbness, tingling or new weaknesses Behavioral/Psych: Mood is stable, no new changes  Breast: Denies any palpable lumps or discharge All other systems were reviewed with the patient and are negative.  PHYSICAL EXAMINATION: ECOG PERFORMANCE STATUS: 0 - Asymptomatic  There were no vitals filed for this visit.  There were no vitals filed for this visit.   GENERAL:alert, no distress and comfortable Neck: No palpable regional adenopathy. BREAST: There is a very subtle palpable left breast mass in the upper inner quadrant at the biopsy site  but this is not well-defined at all.  No other palpable masses.  No regional adenopathy  LABORATORY DATA:  I have reviewed the data as listed Lab Results  Component Value Date   WBC 2.9 (L) 11/04/2021   HGB 14.7 11/04/2021   HCT 42.6 11/04/2021   MCV 90.6 11/04/2021   PLT 200 11/04/2021   Lab Results  Component Value Date   NA 142 11/04/2021   K 3.9 11/04/2021   CL 107 11/04/2021   CO2 29 11/04/2021    RADIOGRAPHIC STUDIES: I have personally reviewed the radiological reports and agreed with the findings in the report.  ASSESSMENT AND PLAN:  No problem-specific Assessment & Plan notes found for this encounter.  All questions were answered. The patient knows to call the clinic with any problems, questions or concerns.    Benay Pike, MD 11/24/21

## 2021-11-30 ENCOUNTER — Other Ambulatory Visit: Payer: Self-pay

## 2021-11-30 ENCOUNTER — Encounter: Payer: Self-pay | Admitting: Hematology and Oncology

## 2021-11-30 ENCOUNTER — Inpatient Hospital Stay: Payer: Medicare HMO | Admitting: Hematology and Oncology

## 2021-11-30 VITALS — BP 150/84 | HR 62 | Temp 97.9°F | Resp 16 | Ht 61.0 in | Wt 148.4 lb

## 2021-11-30 DIAGNOSIS — Z170 Estrogen receptor positive status [ER+]: Secondary | ICD-10-CM | POA: Diagnosis not present

## 2021-11-30 DIAGNOSIS — I341 Nonrheumatic mitral (valve) prolapse: Secondary | ICD-10-CM | POA: Diagnosis not present

## 2021-11-30 DIAGNOSIS — Z803 Family history of malignant neoplasm of breast: Secondary | ICD-10-CM | POA: Diagnosis not present

## 2021-11-30 DIAGNOSIS — C50212 Malignant neoplasm of upper-inner quadrant of left female breast: Secondary | ICD-10-CM

## 2021-11-30 DIAGNOSIS — I10 Essential (primary) hypertension: Secondary | ICD-10-CM | POA: Diagnosis not present

## 2021-11-30 NOTE — Progress Notes (Addendum)
[facility] NOTE  Patient Care Team: Pa, [place] as PCP - General [name], MD as Consulting Physician (General Surgery) Benay Pike, MD as Consulting Physician (Hematology and Oncology) Kyung Rudd, MD as Consulting Physician (Radiation Oncology) Mauro Kaufmann, RN as Oncology Nurse Navigator Rockwell Germany, RN as Oncology Nurse Navigator  CHIEF COMPLAINTS/PURPOSE OF CONSULTATION:  Newly diagnosed breast cancer  HISTORY OF PRESENTING ILLNESS:  Janice Gross 66 y.o. female is here because of recent diagnosis of left breast cancer  I reviewed her records extensively and collaborated the history with the patient.  SUMMARY OF ONCOLOGIC HISTORY: Oncology History  Malignant neoplasm of upper-inner quadrant of left breast in female, estrogen receptor positive ([place])  10/10/2021 Mammogram   Screening mammogram showed loosely grouped calcifications in the right breast at 3:00 posterior depth, indeterminate, additional views are recommended.  Asymmetry in the left breast posterior depth superior region seen on the mediolateral oblique view only is indeterminate.  Diagnostic mammogram showed a 1 cm irregular mass with calcifications in the left breast, ultrasound was recommended.  Ultrasound confirmed a 1.2 x 0.5 cm irregular mass suspicious for malignancy.  No significant abnormalities in the axilla   10/29/2021 Pathology Results   Pathology results from the left breast biopsy showed invasive ductal carcinoma overall grade 2, prognostic showed ER 95% positive strong staining PR 40% positive strong staining, Ki-67 of 10% tumor cells are negative for HER2 1+ by [facility]   11/03/2021 Initial Diagnosis   Malignant neoplasm of upper-inner quadrant of left breast in female, estrogen receptor positive ([place])   11/04/2021 Cancer Staging   Staging form: Breast, AJCC 8th Edition - Clinical stage from 11/04/2021: Stage IA (cT1c, cN0, cM0, G2, ER+,  PR+, HER2-) - Signed by Hayden Pedro, PA-C on 11/04/2021 Stage prefix: Initial diagnosis Method of lymph node assessment: Clinical Histologic grading system: 3 grade system    She has strong family history of breast cancer in mom, sisters but never had any genetic testing.  She had a screening mammogram which detected this abnormality.  At baseline, she is very healthy except for hypertension for which she takes medication and history of mitral valve prolapse.    She is here after lumpectomy.  She has been healing well, denies any new health issues.   We are still awaiting Oncotype results.  MEDICAL HISTORY:  Past Medical History:  Diagnosis Date   Allergic rhinitis    Anemia    pt states she has never heard that she had this   Anxiety    Constipation    Family history of adverse reaction to anesthesia    niece is hard to put to sleep and "sensitive to anesthesia"   GERD (gastroesophageal reflux disease)    Gestational diabetes    Headache(784.0)    Hypertension    IBS (irritable bowel syndrome)    Mitral valve prolapse    Neck pain    Neuromuscular disorder ([place])    Pt reports takign FLU shot in the 90s which caused leg numbmess which has gone away, no tingling "from time to time"   Palpitations     SURGICAL HISTORY: Past Surgical History:  Procedure Laterality Date   BREAST LUMPECTOMY WITH RADIOACTIVE SEED AND SENTINEL LYMPH NODE BIOPSY Left 11/12/2021   Procedure: LEFT BREAST BRACKETED LUMPECTOMY WITH RADIOACTIVE SEED X2 AND SENTINEL LYMPH NODE BIOPSY;  Surgeon: [name], MD;  Location: [place];  Service: General;  Laterality: Left;  60 MIN ROOM  2   c section x 1     COLONOSCOPY  2006   Dr. Isa Rankin    LAPAROSCOPY     surgery to remove ovarian cyst      SOCIAL HISTORY: Social History   Socioeconomic History   Marital status: Divorced    Spouse name: Not on file   Number of children: 1   Years of education: Not on file   Highest education  level: Not on file  Occupational History   Occupation: Marine scientist: [employer]  Tobacco Use   Smoking status: Never   Smokeless tobacco: Never   Tobacco comments:    tried as a teenager  Scientific laboratory technician Use: Never used  Substance and Sexual Activity   Alcohol use: No    Alcohol/week: 0.0 standard drinks of alcohol   Drug use: No   Sexual activity: Not Currently    Birth control/protection: None  Other Topics Concern   Not on file  Social History Narrative   Work or School: Engineer, 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 - reports loves her job      Home Situation: lives alone      Spiritual Beliefs: Christian      Lifestyle:walking on a regular basis; diet is fair         Social Determinants of Health   Financial Resource Strain: Low Risk  (11/04/2021)   Overall Financial Resource Strain (CARDIA)    Difficulty of Paying Living Expenses: Not very hard  Food Insecurity: No Food Insecurity (11/04/2021)   Hunger Vital Sign    Worried About Running Out of Food in the Last Year: Never true    [place] in the Last Year: Never true  Transportation Needs: No Transportation Needs (11/04/2021)   PRAPARE - Hydrologist (Medical): No    Lack of Transportation (Non-Medical): No  Physical Activity: Not on file  Stress: Not on file  Social Connections: Not on file  Intimate Partner Violence: Not on file    FAMILY HISTORY: Family History  Problem Relation Age of Onset   Breast cancer Sister 5   Diabetes Sister    Breast cancer Sister 39   Diabetes Sister    Breast cancer Mother    Ovarian cancer Sister 82   Heart disease Father    Diabetes Brother    Lung cancer Brother    Breast cancer Other        dx in her 50s/dx in her 31s   Colon cancer Other 42   Lung cancer Other        dx in his 83s   Stomach cancer Neg Hx     ALLERGIES:  is allergic to aspirin, fluogen [influenza virus vaccine],  iodine, and other.  MEDICATIONS:  Current Outpatient Medications  Medication Sig Dispense Refill   atenolol (TENORMIN) 25 MG tablet Take 25 mg by mouth daily.     atenolol (TENORMIN) 50 MG tablet Take 1 tablet (50 mg total) by mouth daily. (Patient not taking: Reported on 11/10/2021) 90 tablet 3   Calcium Carbonate-Vitamin D (CALTRATE 600+D) 600-400 MG-UNIT per tablet Take 1 tablet by mouth daily.     Multiple Vitamin (MULTIVITAMIN WITH MINERALS) TABS tablet Take 1 tablet by mouth 4 (four) times a week.     Naphazoline-Pheniramine (OPCON-A OP) Place 1 drop into both eyes daily as needed (dry/irritated eyes).     traMADol Veatrice Bourbon)  50 MG tablet Take 1 tablet (50 mg total) by mouth every 6 (six) hours as needed. 25 tablet 0   No current facility-administered medications for this visit.    REVIEW OF SYSTEMS:   Constitutional: Denies fevers, chills or abnormal night sweats Eyes: Denies blurriness of vision, double vision or watery eyes Ears, nose, mouth, throat, and face: Denies mucositis or sore throat Respiratory: Denies cough, dyspnea or wheezes Cardiovascular: Denies palpitation, chest discomfort or lower extremity swelling Gastrointestinal:  Denies nausea, heartburn or change in bowel habits Skin: Denies abnormal skin rashes Lymphatics: Denies new lymphadenopathy or easy bruising Neurological:Denies numbness, tingling or new weaknesses Behavioral/Psych: Mood is stable, no new changes  Breast: Denies any palpable lumps or discharge All other systems were reviewed with the patient and are negative.  PHYSICAL EXAMINATION: ECOG PERFORMANCE STATUS: 0 - Asymptomatic  Vitals:   11/30/21 0852  BP: (!) 150/84  Pulse: 62  Resp: 16  Temp: 97.9 F (36.6 C)  SpO2: 100%   Filed Weights   11/30/21 0852  Weight: 148 lb 6.4 oz (67.3 kg)    GENERAL:alert, no distress and comfortable Left breast surgical incision appears to be healing well.  Left axilla also appears well. LABORATORY DATA:   I have reviewed the data as listed Lab Results  Component Value Date   WBC 2.9 (L) 11/04/2021   HGB 14.7 11/04/2021   HCT 42.6 11/04/2021   MCV 90.6 11/04/2021   PLT 200 11/04/2021   Lab Results  Component Value Date   NA 142 11/04/2021   K 3.9 11/04/2021   CL 107 11/04/2021   CO2 29 11/04/2021    RADIOGRAPHIC STUDIES: I have personally reviewed the radiological reports and agreed with the findings in the report.  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 We have reviewed final pathology, still awaiting Oncotype results.  More than likely if she does not need adjuvant chemotherapy, she will proceed with adjuvant radiation followed by antiestrogen therapy.  We tried to call Oncotype today and the estimated resulted at 1130.  We will let her know once we have these results.  At this time, she is leaning towards aromatase inhibitors for adjuvant antiestrogen therapy.  She will return to clinic in January to initiate antiestrogen therapy if she does not indeed need adjuvant chemotherapy.  She understands the plan very well.  Total time spent: 20 minutes including history, physical exam, review of records, counseling and coordination of care All questions were answered. The patient knows to call the clinic with any problems, questions or concerns.    Benay Pike, MD 11/30/21

## 2021-11-30 NOTE — Assessment & Plan Note (Signed)
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 We have reviewed final pathology, still awaiting Oncotype results.  More than likely if she does not need adjuvant chemotherapy, she will proceed with adjuvant radiation followed by antiestrogen therapy.  We tried to call Oncotype today and the estimated resulted at 1130.  We will let her know once we have these results.  At this time, she is leaning towards aromatase inhibitors for adjuvant antiestrogen therapy.  She will return to clinic in January to initiate antiestrogen therapy if she does not indeed need adjuvant chemotherapy.  She understands the plan very well.

## 2021-12-01 ENCOUNTER — Ambulatory Visit: Payer: Medicare HMO | Admitting: Hematology and Oncology

## 2021-12-01 DIAGNOSIS — Z170 Estrogen receptor positive status [ER+]: Secondary | ICD-10-CM | POA: Diagnosis not present

## 2021-12-01 DIAGNOSIS — C50212 Malignant neoplasm of upper-inner quadrant of left female breast: Secondary | ICD-10-CM | POA: Diagnosis not present

## 2021-12-03 ENCOUNTER — Encounter (HOSPITAL_COMMUNITY): Payer: Self-pay

## 2021-12-03 ENCOUNTER — Telehealth: Payer: Self-pay | Admitting: *Deleted

## 2021-12-03 ENCOUNTER — Encounter: Payer: Self-pay | Admitting: *Deleted

## 2021-12-03 DIAGNOSIS — Z170 Estrogen receptor positive status [ER+]: Secondary | ICD-10-CM

## 2021-12-03 NOTE — Telephone Encounter (Signed)
Received oncotype results of 19/6%.  Spoke with patient and made her aware she does not need chemo and that her next step would be to see Dr. Sondra Come to discuss radiation. Referral has been placed.

## 2021-12-07 ENCOUNTER — Telehealth: Payer: Self-pay | Admitting: Radiation Oncology

## 2021-12-07 ENCOUNTER — Ambulatory Visit
Admission: RE | Admit: 2021-12-07 | Discharge: 2021-12-07 | Disposition: A | Discharge status: Home / Self Care | Payer: Self-pay | Source: Ambulatory Visit | Attending: Radiation Oncology | Admitting: Radiation Oncology

## 2021-12-07 ENCOUNTER — Inpatient Hospital Stay
Admission: RE | Admit: 2021-12-07 | Discharge: 2021-12-07 | Disposition: A | Discharge status: Home / Self Care | Payer: Self-pay | Source: Ambulatory Visit | Attending: Radiation Oncology | Admitting: Radiation Oncology

## 2021-12-07 ENCOUNTER — Other Ambulatory Visit: Payer: Self-pay | Admitting: Radiation Oncology

## 2021-12-07 DIAGNOSIS — C50212 Malignant neoplasm of upper-inner quadrant of left female breast: Secondary | ICD-10-CM

## 2021-12-07 NOTE — Telephone Encounter (Signed)
LVM to schedule consult with Dr. Solon Augusta

## 2021-12-08 ENCOUNTER — Encounter: Payer: Self-pay | Admitting: *Deleted

## 2021-12-14 ENCOUNTER — Encounter: Payer: Self-pay | Admitting: Physical Therapy

## 2021-12-14 ENCOUNTER — Ambulatory Visit: Payer: Medicare HMO | Attending: Surgery | Admitting: Physical Therapy

## 2021-12-14 DIAGNOSIS — Z170 Estrogen receptor positive status [ER+]: Secondary | ICD-10-CM | POA: Insufficient documentation

## 2021-12-14 DIAGNOSIS — R293 Abnormal posture: Secondary | ICD-10-CM | POA: Diagnosis not present

## 2021-12-14 DIAGNOSIS — Z483 Aftercare following surgery for neoplasm: Secondary | ICD-10-CM | POA: Diagnosis not present

## 2021-12-14 DIAGNOSIS — C50512 Malignant neoplasm of lower-outer quadrant of left female breast: Secondary | ICD-10-CM | POA: Diagnosis not present

## 2021-12-14 DIAGNOSIS — M25612 Stiffness of left shoulder, not elsewhere classified: Secondary | ICD-10-CM | POA: Insufficient documentation

## 2021-12-14 NOTE — Therapy (Signed)
OUTPATIENT PHYSICAL THERAPY BREAST CANCER POST OP FOLLOW UP   Patient Name: Janice Gross MRN: [id number] DOB:[birth year], 66 y.o., female Today's Date: 12/14/2021  END OF SESSION:  PT End of Session - 12/14/21 1607     Visit Number 2    Number of Visits 10    Date for PT Re-Evaluation 01/11/22    PT Start Time 4818    PT Stop Time 5631    PT Time Calculation (min) 50 min    Activity Tolerance Patient tolerated treatment well    Behavior During Therapy WFL for tasks assessed/performed             Past Medical History:  Diagnosis Date   Allergic rhinitis    Anemia    pt states she has never heard that she had this   Anxiety    Constipation    Family history of adverse reaction to anesthesia    niece is hard to put to sleep and "sensitive to anesthesia"   GERD (gastroesophageal reflux disease)    Gestational diabetes    Headache(784.0)    Hypertension    IBS (irritable bowel syndrome)    Mitral valve prolapse    Neck pain    Neuromuscular disorder ([place])    Pt reports takign FLU shot in the 90s which caused leg numbmess which has gone away, no tingling "from time to time"   Palpitations    Past Surgical History:  Procedure Laterality Date   BREAST LUMPECTOMY WITH RADIOACTIVE SEED AND SENTINEL LYMPH NODE BIOPSY Left 11/12/2021   Procedure: LEFT BREAST BRACKETED LUMPECTOMY WITH RADIOACTIVE SEED X2 AND SENTINEL LYMPH NODE BIOPSY;  Surgeon: [name], MD;  Location: [facility];  Service: General;  Laterality: Left;  39 MIN ROOM 2   c section x 1     COLONOSCOPY  2006   Dr. Isa Rankin    LAPAROSCOPY     surgery to remove ovarian cyst     Patient Active Problem List   Diagnosis Date Noted   Malignant neoplasm of upper-inner quadrant of left breast in female, estrogen receptor positive ([place]) 11/03/2021   Mitral valve prolapse 12/03/2015   Anxiety 05/08/2015   Essential hypertension 05/08/2015   Genetic testing 11/23/2013   Family history of breast cancer  in first degree relative 08/09/2012   Vaginal atrophy 08/09/2012   Menopause syndrome 08/09/2012   Dyspepsia and other specified disorders of function of stomach 05/03/2012   History of neurological disorder 04/04/2012   Overweight(278.02) 07/05/2011   Atrophic vaginitis 07/05/2011   Family history of breast cancer in mother 07/05/2011   Recurrent yeast infection 07/02/2011   ANEMIA 12/25/2008   CONSTIPATION 12/25/2008   NECK PAIN 12/25/2008   HEADACHE 12/25/2008   PALPITATIONS 12/25/2008   ANXIETY 03/24/2007   ALLERGIC RHINITIS 03/24/2007   GERD 03/24/2007   IRRITABLE BOWEL SYNDROME 03/24/2007   GESTATIONAL DIABETES 03/24/2007   MITRAL VALVE PROLAPSE, HX OF 03/24/2007    PCP: [facility]  REFERRING PROVIDER: Dr. [name]  REFERRING DIAG: Left breast cancer  THERAPY DIAG:  Abnormal posture - Plan: PT plan of care cert/re-cert  Aftercare following surgery for neoplasm - Plan: PT plan of care cert/re-cert  Stiffness of left shoulder, not elsewhere classified - Plan: PT plan of care cert/re-cert  Malignant neoplasm of lower-outer quadrant of left breast of female, estrogen receptor positive ([place]) - Plan: PT plan of care cert/re-cert  Rationale for Evaluation and Treatment: Rehabilitation  ONSET DATE: 10/10/21  SUBJECTIVE:  SUBJECTIVE STATEMENT: I feel tightness going down my arm and it almost feels like little bubbles in a line in my armpit.   PERTINENT HISTORY:  Patient was diagnosed on 10/10/2021 with left grade 2 invasive ductal carcinoma breast cancer. It measures 1.2 cm and is located in the upper inner quadrant. It is ER/PR positive and HER2 negative with a Ki67 of 10%. 11/12/21- L breast lumpectomy and SLNB (0/9)  PATIENT GOALS:  Reassess how my recovery is  going related to arm function, pain, and swelling.  PAIN:  Are you having pain? No  PRECAUTIONS: Recent Surgery, left UE Lymphedema risk,   ACTIVITY LEVEL / LEISURE: pt rides a bike and does leg and arm exercises and post op exercises daily    OBJECTIVE:   PATIENT SURVEYS:  QUICK DASH:  Quick Dash - 12/14/21 0001     Open a tight or new jar No difficulty    Do heavy household chores (wash walls, wash floors) No difficulty    Carry a shopping bag or briefcase No difficulty    Wash your back No difficulty    Use a knife to cut food No difficulty    Recreational activities in which you take some force or impact through your arm, shoulder, or hand (golf, hammering, tennis) Mild difficulty    During the past week, to what extent has your arm, shoulder or hand problem interfered with your normal social activities with family, friends, neighbors, or groups? Not at all    During the past week, to what extent has your arm, shoulder or hand problem limited your work or other regular daily activities Slightly    Arm, shoulder, or hand pain. Mild    Tingling (pins and needles) in your arm, shoulder, or hand Mild    Difficulty Sleeping No difficulty    DASH Score 9.09 %              OBSERVATIONS: Healing lumpectomy scar with palpable fibrotic/scar tissue surrounding it, healed SLNB scar with numerous cords palpable and observable extending from scar going down L UE  POSTURE:  Forward head and rounded shoulders posture   UPPER EXTREMITY AROM/PROM:   A/PROM RIGHT   eval    Shoulder extension 52  Shoulder flexion 151  Shoulder abduction 156  Shoulder internal rotation 70  Shoulder external rotation 87                          (Blank rows = not tested)   A/PROM LEFT   eval LEFT 12/14/21  Shoulder extension 67 76  Shoulder flexion 140 164  Shoulder abduction 145 164  Shoulder internal rotation 78 65  Shoulder external rotation 81 80                          (Blank rows =  not tested)     CERVICAL AROM: All within normal limits   UPPER EXTREMITY STRENGTH: WNL   LYMPHEDEMA ASSESSMENTS:    LANDMARK RIGHT   eval  10 cm proximal to olecranon process 24.2  Olecranon process 22.5  10 cm proximal to ulnar styloid process 20.6  Just proximal to ulnar styloid process 15.1  Across hand at thumb web space 18.2  At base of 2nd digit 6.8  (Blank rows = not tested)   LANDMARK LEFT   eval LEFT 12/14/21  10 cm proximal to olecranon process 25.4 25.6  Olecranon process 22.4 23.5  10 cm proximal to ulnar styloid process 20.4 19.7  Just proximal to ulnar styloid process 15.1 15.5  Across hand at thumb web space 17.8 18.3  At base of 2nd digit 6.7 6.3  (Blank rows = not tested)  Surgery type/Date: 11/12/21 Number of lymph nodes removed: 9 - all negative Current/past treatment (chemo, radiation, hormone therapy): will begin radiation, will need hormone therapy Other symptoms:  Heaviness/tightness Yes Pain No Pitting edema No Infections No Decreased scar mobility Yes Stemmer sign No  PATIENT EDUCATION:  Education details: what axillary cording is, how to stretch for cording Person educated: Patient Education method: Explanation Education comprehension: verbalized understanding  HOME EXERCISE PROGRAM: Reviewed previously given post op HEP. Focus on end range stretching  ASSESSMENT:  CLINICAL IMPRESSION: Pt returns to PT following a L lumpectomy and SLNB on 11/12/21. Pt has exceeded baseline ROM measurements. Her scars are healing well. She does have some fibrosis/scar tissue surrounding lumpectomy scar. She has tightness at end range of L shoulder ROM secondary to numerous cords palpable and visible in L axilla. She would benefit from skilled PT services to decrease scar tissue surrounding her scars and to decrease cording to allow improved comfort at end range of motion.   Pt will benefit from skilled therapeutic intervention to improve on the following  deficits: Decreased knowledge of precautions, impaired UE functional use, pain, decreased ROM, postural dysfunction.   PT treatment/interventions: ADL/Self care home management, Therapeutic exercises, Therapeutic activity, Patient/Family education, Self Care, Joint mobilization, Orthotic/Fit training, Manual lymph drainage, Compression bandaging, scar mobilization, Taping, Manual therapy, and Re-evaluation   GOALS: Goals reviewed with patient? Yes  LONG TERM GOALS:  (STG=LTG)  GOALS Name Target Date  Goal status  1 Pt will demonstrate she has regained full shoulder ROM and function post operatively compared to baselines.  Baseline: 12/14/21 MET  2 Pt will report no tightness at end range of L shoulder motion to allow improved comfort.  01/11/22 NEW  3 Pt will be independent in a home exercise program for continued stretching and strengthening.  01/11/22 NEW  4 Pt will demonstrate 50% improvement in feelings of fibrosis/scar tissue surrounding L lumpectomy scar for improved comfort.  01/11/22 NEW     PLAN:  PT FREQUENCY/DURATION: 2x/wk for 4 wks  PLAN FOR NEXT SESSION: print off post op handout, sign up for ABC class and make sure next SOZO is scheduled, MFR cording L axilla, eventually STM to area surrounding lumpectomy scar to decrease tightness   [facility]  [address]  [place] [address]  [phone]  After Breast Cancer Class It is recommended you attend the ABC class to be educated on lymphedema risk reduction. This class is free of charge and lasts for 1 hour. It is a 1-time class. You will need to download the Webex app either on your phone or computer. We will send you a link the night before or the morning of the class. You should be able to click on that link to join the class. This is not a confidential class. You don't have to turn your camera on, but other participants may be able to see your email address.  Scar massage You can begin  gentle scar massage to you incision sites. Gently place one hand on the incision and move the skin (without sliding on the skin) in various directions. Do this for a few minutes and then you can gently massage either coconut oil or vitamin E cream into the  scars.  Compression garment You should continue wearing your compression bra until you feel like you no longer have swelling.  Home exercise Program Continue doing the exercises you were given until you feel like you can do them without feeling any tightness at the end.   Walking Program Studies show that 30 minutes of walking per day (fast enough to elevate your heart rate) can significantly reduce the risk of a cancer recurrence. If you can't walk due to other medical reasons, we encourage you to find another activity you could do (like a stationary bike or water exercise).  Posture After breast cancer surgery, people frequently sit with rounded shoulders posture because it puts their incisions on slack and feels better. If you sit like this and scar tissue forms in that position, you can become very tight and have pain sitting or standing with good posture. Try to be aware of your posture and sit and stand up tall to heal properly.  Follow up PT: It is recommended you return every 3 months for the first 3 years following surgery to be assessed on the SOZO machine for an L-Dex score. This helps prevent clinically significant lymphedema in 95% of patients. These follow up screens are 10 minute appointments that you are not billed for.  [facility], PT 12/14/2021, 5:08 PM

## 2021-12-17 ENCOUNTER — Ambulatory Visit: Payer: PRIVATE HEALTH INSURANCE

## 2021-12-17 ENCOUNTER — Ambulatory Visit: Payer: PRIVATE HEALTH INSURANCE | Admitting: Radiation Oncology

## 2021-12-17 ENCOUNTER — Encounter (HOSPITAL_COMMUNITY): Payer: Self-pay

## 2021-12-17 ENCOUNTER — Ambulatory Visit: Payer: Medicare HMO | Admitting: Radiation Oncology

## 2021-12-21 NOTE — Progress Notes (Signed)
Radiation Oncology         [phone]) [phone] ________________________________  Name: Janice Gross        MRN: [id number]  Date of Service: 12/23/2021 DOB: 14-Jun-1955  CC:Pa, [place]  Janice Pike, MD     REFERRING PHYSICIAN: Benay Pike, MD   DIAGNOSIS: The encounter diagnosis was Malignant neoplasm of upper-inner quadrant of left breast in female, estrogen receptor positive ([place]).   HISTORY OF PRESENT ILLNESS: Janice Gross is a 66 y.o. female originally seen in the multidisciplinary breast clinic for a new diagnosis of left breast cancer. The patient was noted to have screening detected mass in the left breast. Further diagnostic work up showed a group of calcifications and a 1 cm mass. The entire area measured 3.8 cm mammographically. By ultrasound at 10:00 there was a 1.2 cm mass and her left axilla was negative. She underwent biopsies which showed grade 2 invasive ductal carcinoma that was ER/PR positive, HER2 negative with a Ki-67 of 10%.   Since her last visit, the patient underwent a bracketed left breast lumpectomy with sentinel lymph node biopsy on 11/12/2021 with Dr. Barry Dienes.  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  Her invasive cancer measured 3.6 cm in greatest dimension.  Her margins were negative for invasive disease but 0.25 mm from the medial margin.  Associated with fibrocystic change and microcalcifications were seen 9 sentinel lymph nodes were submitted and all were negative for disease.  Oncotype DX score was 19 and no systemic chemotherapy is planned. She also met with Dr. Iran Planas and will plan breast reduction about 6 months after radiation. She is seen to discuss adjuvant radiotherapy.   PREVIOUS RADIATION THERAPY: No   PAST MEDICAL HISTORY:  Past Medical History:  Diagnosis Date   Allergic rhinitis    Anemia    pt states she has never heard that she  had this   Anxiety    Constipation    Family history of adverse reaction to anesthesia    niece is hard to put to sleep and "sensitive to anesthesia"   GERD (gastroesophageal reflux disease)    Gestational diabetes    Headache(784.0)    Hypertension    IBS (irritable bowel syndrome)    Mitral valve prolapse    Neck pain    Neuromuscular disorder (HCC)    Pt reports takign FLU shot in the 90s which caused leg numbmess which has gone away, no tingling "from time to time"   Palpitations        PAST SURGICAL HISTORY: Past Surgical History:  Procedure Laterality Date   BREAST LUMPECTOMY WITH RADIOACTIVE SEED AND SENTINEL LYMPH NODE BIOPSY Left 11/12/2021   Procedure: LEFT BREAST BRACKETED LUMPECTOMY WITH RADIOACTIVE SEED X2 AND SENTINEL LYMPH NODE BIOPSY;  Surgeon: [name], MD;  Location: [facility];  Service: General;  Laterality: Left;  46 MIN ROOM 2   c section x 1     COLONOSCOPY  2006   Dr. Isa Rankin    LAPAROSCOPY     surgery to remove ovarian cyst       FAMILY HISTORY:  Family History  Problem Relation Age of Onset   Breast cancer Sister 70   Diabetes Sister    Breast cancer Sister 42   Diabetes Sister    Breast cancer Mother    Ovarian cancer Sister 36   Heart disease Father    Diabetes Brother  Lung cancer Brother    Breast cancer Other        dx in her 50s/dx in her 71s   Colon cancer Other 43   Lung cancer Other        dx in his 70s   Stomach cancer Neg Hx      SOCIAL HISTORY:  reports that she has never smoked. She has never used smokeless tobacco. She reports that she does not drink alcohol and does not use drugs.The patient is divorced and lives in [place]. She works as a Research scientist (physical sciences) at [employer]. She has an adult son, and three grandchildren she enjoys spending time with. She's very active and came out of retirement to go back to work.   ALLERGIES: Aspirin, Fluogen [influenza virus vaccine], Iodine, and Other   MEDICATIONS:   Current Outpatient Medications  Medication Sig Dispense Refill   atenolol (TENORMIN) 25 MG tablet Take 25 mg by mouth daily.     atenolol (TENORMIN) 50 MG tablet Take 1 tablet (50 mg total) by mouth daily. (Patient not taking: Reported on 11/10/2021) 90 tablet 3   Calcium Carbonate-Vitamin D (CALTRATE 600+D) 600-400 MG-UNIT per tablet Take 1 tablet by mouth daily.     Multiple Vitamin (MULTIVITAMIN WITH MINERALS) TABS tablet Take 1 tablet by mouth 4 (four) times a week.     Naphazoline-Pheniramine (OPCON-A OP) Place 1 drop into both eyes daily as needed (dry/irritated eyes).     traMADol (ULTRAM) 50 MG tablet Take 1 tablet (50 mg total) by mouth every 6 (six) hours as needed. 25 tablet 0   No current facility-administered medications for this visit.     REVIEW OF SYSTEMS: On review of systems, the patient reports that she is doing well. She's had some fullness in her breast since surgery and occasional discomfort in the breast that lasts a few seconds. She was concerned that she would need to have an aspiration of the cavity, but fortunately has been able to avoid this. No other complaints are noted.      PHYSICAL EXAM:  Wt Readings from Last 3 Encounters:  11/30/21 148 lb 6.4 oz (67.3 kg)  11/12/21 146 lb (66.2 kg)  11/11/21 146 lb 3.2 oz (66.3 kg)   Temp Readings from Last 3 Encounters:  11/30/21 97.9 F (36.6 C) (Temporal)  11/12/21 98.2 F (36.8 C)  11/11/21 98.4 F (36.9 C)   BP Readings from Last 3 Encounters:  11/30/21 (!) 150/84  11/12/21 139/83  11/11/21 (!) 162/82   Pulse Readings from Last 3 Encounters:  11/30/21 62  11/12/21 (!) 50  11/11/21 (!) 55    In general this is a well appearing African American female in no acute distress. She's alert and oriented x4 and appropriate throughout the examination. Cardiopulmonary assessment is negative for acute distress and she exhibits normal effort.  Her left breast is examined and reveals a well-healed surgical incision  site in the upper outer breast. She has induration deep to her incision but no palpable fluctuance or fluid wave.     ECOG =1  0 - Asymptomatic (Fully active, able to carry on all predisease activities without restriction)  1 - Symptomatic but completely ambulatory (Restricted in physically strenuous activity but ambulatory and able to carry out work of a light or sedentary nature. For example, light housework, office work)  2 - Symptomatic, <50% in bed during the day (Ambulatory and capable of all self care but unable to carry out any work activities. Up and about  more than 50% of waking hours)  3 - Symptomatic, >50% in bed, but not bedbound (Capable of only limited self-care, confined to bed or chair 50% or more of waking hours)  4 - Bedbound (Completely disabled. Cannot carry on any self-care. Totally confined to bed or chair)  5 - Death   Eustace Pen MM, Creech RH, Tormey DC, et al. [phone]). "Toxicity and response criteria of the [facility] Group". [place] Oncol. 5 (6): 649-55    LABORATORY DATA:  Lab Results  Component Value Date   WBC 2.9 (L) 11/04/2021   HGB 14.7 11/04/2021   HCT 42.6 11/04/2021   MCV 90.6 11/04/2021   PLT 200 11/04/2021   Lab Results  Component Value Date   NA 142 11/04/2021   K 3.9 11/04/2021   CL 107 11/04/2021   CO2 29 11/04/2021   Lab Results  Component Value Date   ALT 12 11/04/2021   AST 20 11/04/2021   ALKPHOS 49 11/04/2021   BILITOT 0.8 11/04/2021      RADIOGRAPHY: No results found.     IMPRESSION/PLAN: 1. Stage IA, pT2N0M0, grade 2, ER/PR positive invasive ductal carcinoma of the left breast. Dr. Lisbeth Renshaw has reviewed her final pathology findings and we discussed the nature of early stage left breast cancer.  She has done well since surgery and does not need systemic chemotherapy.  We reviewed Dr. Ida Rogue recommendation for external radiotherapy to the breast  to reduce risks of local recurrence followed by antiestrogen  therapy. We discussed the risks, benefits, short, and long term effects of radiotherapy, as well as the curative intent, and the patient is interested in proceeding. We reviewed the delivery and logistics of radiotherapy and Dr. Lisbeth Renshaw recommends 4 weeks of radiotherapy to the left breast with deep inspiration breath hold technique. Written consent is obtained and placed in the chart, a copy was provided to the patient. She will simulate today.    In a visit lasting 45 minutes, greater than 50% of the time was spent face to face reviewing her case, as well as in preparation of, discussing, and coordinating the patient's care.       Carola Rhine, [facility]    **Disclaimer: This note was dictated with voice recognition software. Similar sounding words can inadvertently be transcribed and this note may contain transcription errors which may not have been corrected upon publication of note.**

## 2021-12-22 ENCOUNTER — Encounter: Payer: Self-pay | Admitting: Physical Therapy

## 2021-12-22 ENCOUNTER — Ambulatory Visit: Payer: Medicare HMO | Admitting: Physical Therapy

## 2021-12-22 DIAGNOSIS — Z170 Estrogen receptor positive status [ER+]: Secondary | ICD-10-CM

## 2021-12-22 DIAGNOSIS — M25612 Stiffness of left shoulder, not elsewhere classified: Secondary | ICD-10-CM | POA: Diagnosis not present

## 2021-12-22 DIAGNOSIS — C50512 Malignant neoplasm of lower-outer quadrant of left female breast: Secondary | ICD-10-CM | POA: Diagnosis not present

## 2021-12-22 DIAGNOSIS — Z483 Aftercare following surgery for neoplasm: Secondary | ICD-10-CM | POA: Diagnosis not present

## 2021-12-22 DIAGNOSIS — R293 Abnormal posture: Secondary | ICD-10-CM | POA: Diagnosis not present

## 2021-12-22 NOTE — Therapy (Signed)
OUTPATIENT PHYSICAL THERAPY BREAST CANCER POST OP FOLLOW UP   Patient Name: Janice Gross MRN: [id number] DOB:Mar 14, 1955, 66 y.o., female Today's Date: 12/22/2021  END OF SESSION:  PT End of Session - 12/22/21 1507     Visit Number 3    Number of Visits 10    Date for PT Re-Evaluation 01/11/22    PT Start Time 9021    PT Stop Time 1600    PT Time Calculation (min) 55 min    Activity Tolerance Patient tolerated treatment well    Behavior During Therapy WFL for tasks assessed/performed             Past Medical History:  Diagnosis Date   Allergic rhinitis    Anemia    pt states she has never heard that she had this   Anxiety    Constipation    Family history of adverse reaction to anesthesia    niece is hard to put to sleep and "sensitive to anesthesia"   GERD (gastroesophageal reflux disease)    Gestational diabetes    Headache(784.0)    Hypertension    IBS (irritable bowel syndrome)    Mitral valve prolapse    Neck pain    Neuromuscular disorder ([place])    Pt reports takign FLU shot in the 90s which caused leg numbmess which has gone away, no tingling "from time to time"   Palpitations    Past Surgical History:  Procedure Laterality Date   BREAST LUMPECTOMY WITH RADIOACTIVE SEED AND SENTINEL LYMPH NODE BIOPSY Left 11/12/2021   Procedure: LEFT BREAST BRACKETED LUMPECTOMY WITH RADIOACTIVE SEED X2 AND SENTINEL LYMPH NODE BIOPSY;  Surgeon: [name], MD;  Location: [facility];  Service: General;  Laterality: Left;  16 MIN ROOM 2   c section x 1     COLONOSCOPY  2006   Dr. Isa Rankin    LAPAROSCOPY     surgery to remove ovarian cyst     Patient Active Problem List   Diagnosis Date Noted   Malignant neoplasm of upper-inner quadrant of left breast in female, estrogen receptor positive ([place]) 11/03/2021   Mitral valve prolapse 12/03/2015   Anxiety 05/08/2015   Essential hypertension 05/08/2015   Genetic testing 11/23/2013   Family history of breast cancer  in first degree relative 08/09/2012   Vaginal atrophy 08/09/2012   Menopause syndrome 08/09/2012   Dyspepsia and other specified disorders of function of stomach 05/03/2012   History of neurological disorder 04/04/2012   Overweight(278.02) 07/05/2011   Atrophic vaginitis 07/05/2011   Family history of breast cancer in mother 07/05/2011   Recurrent yeast infection 07/02/2011   ANEMIA 12/25/2008   CONSTIPATION 12/25/2008   NECK PAIN 12/25/2008   HEADACHE 12/25/2008   PALPITATIONS 12/25/2008   ANXIETY 03/24/2007   ALLERGIC RHINITIS 03/24/2007   GERD 03/24/2007   IRRITABLE BOWEL SYNDROME 03/24/2007   GESTATIONAL DIABETES 03/24/2007   MITRAL VALVE PROLAPSE, HX OF 03/24/2007    PCP: [facility]  REFERRING PROVIDER: Dr. [name]  REFERRING DIAG: Left breast cancer  THERAPY DIAG:  Aftercare following surgery for neoplasm  Stiffness of left shoulder, not elsewhere classified  Abnormal posture  Malignant neoplasm of lower-outer quadrant of left breast of female, estrogen receptor positive ([place])  Rationale for Evaluation and Treatment: Rehabilitation  ONSET DATE: 10/10/21  SUBJECTIVE:  SUBJECTIVE STATEMENT: My arm seems like it is better. I am not feeling that discomfort. Sometimes I might reach and feel some tightness.   PERTINENT HISTORY:  Patient was diagnosed on 10/10/2021 with left grade 2 invasive ductal carcinoma breast cancer. It measures 1.2 cm and is located in the upper inner quadrant. It is ER/PR positive and HER2 negative with a Ki67 of 10%. 11/12/21- L breast lumpectomy and SLNB (0/9)  PATIENT GOALS:  Reassess how my recovery is going related to arm function, pain, and swelling.  PAIN:  Are you having pain? No  PRECAUTIONS: Recent Surgery, left UE  Lymphedema risk,   ACTIVITY LEVEL / LEISURE: pt rides a bike and does leg and arm exercises and post op exercises daily    OBJECTIVE:   PATIENT SURVEYS:  QUICK DASH:     OBSERVATIONS: Healing lumpectomy scar with palpable fibrotic/scar tissue surrounding it, healed SLNB scar with numerous cords palpable and observable extending from scar going down L UE  POSTURE:  Forward head and rounded shoulders posture   UPPER EXTREMITY AROM/PROM:   A/PROM RIGHT   eval    Shoulder extension 52  Shoulder flexion 151  Shoulder abduction 156  Shoulder internal rotation 70  Shoulder external rotation 87                          (Blank rows = not tested)   A/PROM LEFT   eval LEFT 12/14/21  Shoulder extension 67 76  Shoulder flexion 140 164  Shoulder abduction 145 164  Shoulder internal rotation 78 65  Shoulder external rotation 81 80                          (Blank rows = not tested)     CERVICAL AROM: All within normal limits   UPPER EXTREMITY STRENGTH: WNL   LYMPHEDEMA ASSESSMENTS:    LANDMARK RIGHT   eval  10 cm proximal to olecranon process 24.2  Olecranon process 22.5  10 cm proximal to ulnar styloid process 20.6  Just proximal to ulnar styloid process 15.1  Across hand at thumb web space 18.2  At base of 2nd digit 6.8  (Blank rows = not tested)   LANDMARK LEFT   eval LEFT 12/14/21  10 cm proximal to olecranon process 25.4 25.6  Olecranon process 22.4 23.5  10 cm proximal to ulnar styloid process 20.4 19.7  Just proximal to ulnar styloid process 15.1 15.5  Across hand at thumb web space 17.8 18.3  At base of 2nd digit 6.7 6.3  (Blank rows = not tested)  Surgery type/Date: 11/12/21 Number of lymph nodes removed: 9 - all negative Current/past treatment (chemo, radiation, hormone therapy): will begin radiation, will need hormone therapy Other symptoms:  Heaviness/tightness Yes Pain No Pitting edema No Infections No Decreased scar mobility Yes Stemmer sign  No  TREATMENT PERFORMED: 12/22/21: MFR to cording in L UE extending from axilla down upper arm with numerous cords palpable and visible, STM to area surrounding lumpectomy scar where there is increased scar tissue  PATIENT EDUCATION:  Education details: what axillary cording is, how to stretch for cording Person educated: Patient Education method: Explanation Education comprehension: verbalized understanding  HOME EXERCISE PROGRAM: Reviewed previously given post op HEP. Focus on end range stretching  ASSESSMENT:  CLINICAL IMPRESSION: Continued with myofascial release to cording in L axilla extending towards upper arm with numerous cords palpable and a very thick cord  palpable in medial axilla. Also began work on scar tissue surrounding her lumpectomy scar because this area is very fibrotic. Did not go directly over scar as it is still healing.   Pt will benefit from skilled therapeutic intervention to improve on the following deficits: Decreased knowledge of precautions, impaired UE functional use, pain, decreased ROM, postural dysfunction.   PT treatment/interventions: ADL/Self care home management, Therapeutic exercises, Therapeutic activity, Patient/Family education, Self Care, Joint mobilization, Orthotic/Fit training, Manual lymph drainage, Compression bandaging, scar mobilization, Taping, Manual therapy, and Re-evaluation   GOALS: Goals reviewed with patient? Yes  LONG TERM GOALS:  (STG=LTG)  GOALS Name Target Date  Goal status  1 Pt will demonstrate she has regained full shoulder ROM and function post operatively compared to baselines.  Baseline: 12/14/21 MET  2 Pt will report no tightness at end range of L shoulder motion to allow improved comfort.  01/11/22 NEW  3 Pt will be independent in a home exercise program for continued stretching and strengthening.  01/11/22 NEW  4 Pt will demonstrate 50% improvement in feelings of fibrosis/scar tissue surrounding L lumpectomy scar  for improved comfort.  01/11/22 NEW     PLAN:  PT FREQUENCY/DURATION: 2x/wk for 4 wks  PLAN FOR NEXT SESSION: MFR cording L axilla, eventually STM to area surrounding lumpectomy scar to decrease tightness   [facility]  [address], [address]  [place] [address]  [phone]  After Breast Cancer Class It is recommended you attend the ABC class to be educated on lymphedema risk reduction. This class is free of charge and lasts for 1 hour. It is a 1-time class. You will need to download the Webex app either on your phone or computer. We will send you a link the night before or the morning of the class. You should be able to click on that link to join the class. This is not a confidential class. You don't have to turn your camera on, but other participants may be able to see your email address.  Scar massage You can begin gentle scar massage to you incision sites. Gently place one hand on the incision and move the skin (without sliding on the skin) in various directions. Do this for a few minutes and then you can gently massage either coconut oil or vitamin E cream into the scars.  Compression garment You should continue wearing your compression bra until you feel like you no longer have swelling.  Home exercise Program Continue doing the exercises you were given until you feel like you can do them without feeling any tightness at the end.   Walking Program Studies show that 30 minutes of walking per day (fast enough to elevate your heart rate) can significantly reduce the risk of a cancer recurrence. If you can't walk due to other medical reasons, we encourage you to find another activity you could do (like a stationary bike or water exercise).  Posture After breast cancer surgery, people frequently sit with rounded shoulders posture because it puts their incisions on slack and feels better. If you sit like this and scar tissue forms in that position, you can  become very tight and have pain sitting or standing with good posture. Try to be aware of your posture and sit and stand up tall to heal properly.  Follow up PT: It is recommended you return every 3 months for the first 3 years following surgery to be assessed on the SOZO machine for an L-Dex score.  This helps prevent clinically significant lymphedema in 95% of patients. These follow up screens are 10 minute appointments that you are not billed for.  [facility] [place], PT 12/22/2021, 4:10 PM

## 2021-12-23 ENCOUNTER — Ambulatory Visit
Admission: RE | Admit: 2021-12-23 | Discharge: 2021-12-23 | Disposition: A | Discharge status: Home / Self Care | Payer: Medicare HMO | Source: Ambulatory Visit | Attending: Radiation Oncology | Admitting: Radiation Oncology

## 2021-12-23 ENCOUNTER — Other Ambulatory Visit: Payer: Self-pay

## 2021-12-23 ENCOUNTER — Encounter: Payer: Self-pay | Admitting: Radiation Oncology

## 2021-12-23 VITALS — BP 164/85 | HR 62 | Temp 96.8°F | Resp 18 | Ht 61.0 in | Wt 148.2 lb

## 2021-12-23 DIAGNOSIS — Z170 Estrogen receptor positive status [ER+]: Secondary | ICD-10-CM | POA: Insufficient documentation

## 2021-12-23 DIAGNOSIS — C50212 Malignant neoplasm of upper-inner quadrant of left female breast: Secondary | ICD-10-CM | POA: Insufficient documentation

## 2021-12-23 NOTE — Progress Notes (Addendum)
Reconsult nursing appointment for Malignant neoplasm of upper-inner quadrant of left breast in female, estrogen receptor positive ([place]).  I verified patient's identity and began nursing interview. Patient reports LT breast tenderness 2/10, itching and thickened scar tissue at incision line. No other issues reported at this time.  Meaningful use complete. NO chances of pregnancy.  BP (!) 161/99 (BP Location: Right Arm, Patient Position: Sitting, Cuff Size: Normal)   Pulse 69   Temp (!) 96.8 F (36 C) (Temporal)   Resp 18   Ht '5\' 1"'$  (1.549 m)   Wt 148 lb 4 oz (67.2 kg)   SpO2 100%   BMI 28.01 kg/m   This concludes the interview.   Leandra Kern, LPN

## 2021-12-24 ENCOUNTER — Encounter: Payer: Self-pay | Admitting: Rehabilitation

## 2021-12-24 ENCOUNTER — Ambulatory Visit: Payer: Medicare HMO | Admitting: Rehabilitation

## 2021-12-24 DIAGNOSIS — Z170 Estrogen receptor positive status [ER+]: Secondary | ICD-10-CM | POA: Diagnosis not present

## 2021-12-24 DIAGNOSIS — M25612 Stiffness of left shoulder, not elsewhere classified: Secondary | ICD-10-CM | POA: Diagnosis not present

## 2021-12-24 DIAGNOSIS — Z483 Aftercare following surgery for neoplasm: Secondary | ICD-10-CM

## 2021-12-24 DIAGNOSIS — R293 Abnormal posture: Secondary | ICD-10-CM | POA: Diagnosis not present

## 2021-12-24 DIAGNOSIS — C50512 Malignant neoplasm of lower-outer quadrant of left female breast: Secondary | ICD-10-CM | POA: Diagnosis not present

## 2021-12-24 NOTE — Therapy (Signed)
OUTPATIENT PHYSICAL THERAPY BREAST CANCER TREATMENT   Patient Name: Janice Gross MRN: [id number] DOB:11-Feb-1955, 66 y.o., female Today's Date: 12/24/2021  END OF SESSION:  PT End of Session - 12/24/21 1711     Visit Number 4    Number of Visits 10    Date for PT Re-Evaluation 01/11/22    PT Start Time 1602    PT Stop Time 4008    PT Time Calculation (min) 46 min    Activity Tolerance Patient tolerated treatment well    Behavior During Therapy WFL for tasks assessed/performed              Past Medical History:  Diagnosis Date   Allergic rhinitis    Anemia    pt states she has never heard that she had this   Anxiety    Constipation    Family history of adverse reaction to anesthesia    niece is hard to put to sleep and "sensitive to anesthesia"   GERD (gastroesophageal reflux disease)    Gestational diabetes    Headache(784.0)    Hypertension    IBS (irritable bowel syndrome)    Mitral valve prolapse    Neck pain    Neuromuscular disorder ([place])    Pt reports takign FLU shot in the 90s which caused leg numbmess which has gone away, no tingling "from time to time"   Palpitations    Past Surgical History:  Procedure Laterality Date   BREAST LUMPECTOMY WITH RADIOACTIVE SEED AND SENTINEL LYMPH NODE BIOPSY Left 11/12/2021   Procedure: LEFT BREAST BRACKETED LUMPECTOMY WITH RADIOACTIVE SEED X2 AND SENTINEL LYMPH NODE BIOPSY;  Surgeon: [name], MD;  Location: [facility];  Service: General;  Laterality: Left;  36 MIN ROOM 2   c section x 1     COLONOSCOPY  2006   Dr. Isa Rankin    LAPAROSCOPY     surgery to remove ovarian cyst     Patient Active Problem List   Diagnosis Date Noted   Malignant neoplasm of upper-inner quadrant of left breast in female, estrogen receptor positive ([place]) 11/03/2021   Mitral valve prolapse 12/03/2015   Anxiety 05/08/2015   Essential hypertension 05/08/2015   Genetic testing 11/23/2013   Family history of breast cancer in  first degree relative 08/09/2012   Vaginal atrophy 08/09/2012   Menopause syndrome 08/09/2012   Dyspepsia and other specified disorders of function of stomach 05/03/2012   History of neurological disorder 04/04/2012   Overweight(278.02) 07/05/2011   Atrophic vaginitis 07/05/2011   Family history of breast cancer in mother 07/05/2011   Recurrent yeast infection 07/02/2011   ANEMIA 12/25/2008   CONSTIPATION 12/25/2008   NECK PAIN 12/25/2008   HEADACHE 12/25/2008   PALPITATIONS 12/25/2008   ANXIETY 03/24/2007   ALLERGIC RHINITIS 03/24/2007   GERD 03/24/2007   IRRITABLE BOWEL SYNDROME 03/24/2007   GESTATIONAL DIABETES 03/24/2007   MITRAL VALVE PROLAPSE, HX OF 03/24/2007    PCP: [facility]  REFERRING PROVIDER: Dr. [name]  REFERRING DIAG: Left breast cancer  THERAPY DIAG:  Aftercare following surgery for neoplasm  Stiffness of left shoulder, not elsewhere classified  Abnormal posture  Malignant neoplasm of lower-outer quadrant of left breast of female, estrogen receptor positive ([place])  Rationale for Evaluation and Treatment: Rehabilitation  ONSET DATE: 10/10/21  SUBJECTIVE:  SUBJECTIVE STATEMENT:  It feels back to normal.   PERTINENT HISTORY:  Patient was diagnosed on 10/10/2021 with left grade 2 invasive ductal carcinoma breast cancer. It measures 1.2 cm and is located in the upper inner quadrant. It is ER/PR positive and HER2 negative with a Ki67 of 10%. 11/12/21- L breast lumpectomy and SLNB (0/9)  PATIENT GOALS:  Reassess how my recovery is going related to arm function, pain, and swelling.  PAIN:  Are you having pain? No  PRECAUTIONS: Recent Surgery, left UE Lymphedema risk,   ACTIVITY LEVEL / LEISURE: pt rides a bike and does leg and arm exercises and  post op exercises daily    OBJECTIVE:  OBSERVATIONS: Healing lumpectomy scar with palpable fibrotic/scar tissue surrounding it, healed SLNB scar with numerous cords palpable and observable extending from scar going down L UE  POSTURE:  Forward head and rounded shoulders posture   UPPER EXTREMITY AROM/PROM:   A/PROM RIGHT   eval    Shoulder extension 52  Shoulder flexion 151  Shoulder abduction 156  Shoulder internal rotation 70  Shoulder external rotation 87                          (Blank rows = not tested)   A/PROM LEFT   eval LEFT 12/14/21 12/24/21  Shoulder extension 67 76   Shoulder flexion 140 164   Shoulder abduction 145 164   Shoulder internal rotation 78 65   Shoulder external rotation 81 80                           (Blank rows = not tested)  12/24/21: UQS: all full and noted no difference between the sides.       CERVICAL AROM: All within normal limits   UPPER EXTREMITY STRENGTH: WNL   LYMPHEDEMA ASSESSMENTS:    LANDMARK RIGHT   eval  10 cm proximal to olecranon process 24.2  Olecranon process 22.5  10 cm proximal to ulnar styloid process 20.6  Just proximal to ulnar styloid process 15.1  Across hand at thumb web space 18.2  At base of 2nd digit 6.8  (Blank rows = not tested)   LANDMARK LEFT   eval LEFT 12/14/21  10 cm proximal to olecranon process 25.4 25.6  Olecranon process 22.4 23.5  10 cm proximal to ulnar styloid process 20.4 19.7  Just proximal to ulnar styloid process 15.1 15.5  Across hand at thumb web space 17.8 18.3  At base of 2nd digit 6.7 6.3  (Blank rows = not tested)  Surgery type/Date: 11/12/21 Number of lymph nodes removed: 9 - all negative Current/past treatment (chemo, radiation, hormone therapy): will begin radiation, will need hormone therapy Other symptoms:  Heaviness/tightness Yes Pain No Pitting edema No Infections No Decreased scar mobility Yes Stemmer sign No  TREATMENT PERFORMED: 12/24/21:  Rechecked AROM:  pt feels no difference between sides MFR to cording in L UE extending from axilla down upper arm with numerous cords palpable and visible but not felt with AROM.   PROM with axillary pinning STM to area surrounding lumpectomy scar where there is increased scar tissue: skin stretching in various directions  Showed pt silicone sheeting to use on lumpectomy scar as it is raised.   Supine Scap performed x 5 each and given yellow band and instruction for HEP.   12/22/21: MFR to cording in L UE extending from axilla down upper arm with numerous  cords palpable and visible, STM to area surrounding lumpectomy scar where there is increased scar tissue  PATIENT EDUCATION:  Education details: what axillary cording is, how to stretch for cording Person educated: Patient Education method: Explanation Education comprehension: verbalized understanding  HOME EXERCISE PROGRAM: Reviewed previously given post op HEP. Focus on end range stretching  ASSESSMENT:  CLINICAL IMPRESSION: Continued with myofascial release to cording in L axilla extending towards upper arm with numerous cords palpable and a very thick cord palpable in medial axilla although pt is feeling back to normal.  Pt would like to do 1 more visit and then hold during radiation which I agree is a fine plan since pt is doing so well despite visible cording and scar tissue.   Pt will benefit from skilled therapeutic intervention to improve on the following deficits: Decreased knowledge of precautions, impaired UE functional use, pain, decreased ROM, postural dysfunction.   PT treatment/interventions: ADL/Self care home management, Therapeutic exercises, Therapeutic activity, Patient/Family education, Self Care, Joint mobilization, Orthotic/Fit training, Manual lymph drainage, Compression bandaging, scar mobilization, Taping, Manual therapy, and Re-evaluation   GOALS: Goals reviewed with patient? Yes  LONG TERM GOALS:  (STG=LTG)  GOALS Name  Target Date  Goal status  1 Pt will demonstrate she has regained full shoulder ROM and function post operatively compared to baselines.  Baseline: 12/14/21 MET  2 Pt will report no tightness at end range of L shoulder motion to allow improved comfort.  01/11/22 NEW  3 Pt will be independent in a home exercise program for continued stretching and strengthening.  01/11/22 NEW  4 Pt will demonstrate 50% improvement in feelings of fibrosis/scar tissue surrounding L lumpectomy scar for improved comfort.  01/11/22 NEW     PLAN:  PT FREQUENCY/DURATION: 2x/wk for 4 wks  PLAN FOR NEXT SESSION: MFR cording L axilla, eventually STM to area surrounding lumpectomy scar to decrease tightness   [facility]  [address], [address]  [place] [address]  [phone]  After Breast Cancer Class It is recommended you attend the ABC class to be educated on lymphedema risk reduction. This class is free of charge and lasts for 1 hour. It is a 1-time class. You will need to download the Webex app either on your phone or computer. We will send you a link the night before or the morning of the class. You should be able to click on that link to join the class. This is not a confidential class. You don't have to turn your camera on, but other participants may be able to see your email address.  Scar massage You can begin gentle scar massage to you incision sites. Gently place one hand on the incision and move the skin (without sliding on the skin) in various directions. Do this for a few minutes and then you can gently massage either coconut oil or vitamin E cream into the scars.  Compression garment You should continue wearing your compression bra until you feel like you no longer have swelling.  Home exercise Program Continue doing the exercises you were given until you feel like you can do them without feeling any tightness at the end.   Walking Program Studies show that 30 minutes of  walking per day (fast enough to elevate your heart rate) can significantly reduce the risk of a cancer recurrence. If you can't walk due to other medical reasons, we encourage you to find another activity you could do (like a stationary bike or water  exercise).  Posture After breast cancer surgery, people frequently sit with rounded shoulders posture because it puts their incisions on slack and feels better. If you sit like this and scar tissue forms in that position, you can become very tight and have pain sitting or standing with good posture. Try to be aware of your posture and sit and stand up tall to heal properly.  Follow up PT: It is recommended you return every 3 months for the first 3 years following surgery to be assessed on the SOZO machine for an L-Dex score. This helps prevent clinically significant lymphedema in 95% of patients. These follow up screens are 10 minute appointments that you are not billed for.  Stark Bray, PT 12/24/2021, 5:16 PM

## 2021-12-24 NOTE — Patient Instructions (Signed)
Over Head Pull: Narrow and Wide Grip   Cancer Rehab [phone]   On back, knees bent, feet flat, band across thighs, elbows straight but relaxed. Pull hands apart (start). Keeping elbows straight, bring arms up and over head, hands toward floor. Keep pull steady on band. Hold momentarily. Return slowly, keeping pull steady, back to start.  Repeat _5-10__ times. Band color __yellow____   Side Pull: Double Arm   On back, knees bent, feet flat. Arms perpendicular to body, shoulder level, elbows straight but relaxed. Pull arms out to sides, elbows straight. Resistance band comes across collarbones, hands toward floor. Hold momentarily. Slowly return to starting position. Repeat _5-10__ times. Band color _yellow____   Sword   On back, knees bent, feet flat, left hand on left hip, right hand above left. Pull right arm DIAGONALLY (hip to shoulder) across chest. Bring right arm along head toward floor. Hold momentarily. Slowly return to starting position. Repeat _5-10__ times. Do with left arm. Band color _yellow_____   Shoulder Rotation: Double Arm   On back, knees bent, feet flat, elbows tucked at sides, bent 90, hands palms up. Pull hands apart and down toward floor, keeping elbows near sides. Hold momentarily. Slowly return to starting position. Repeat _5-10__ times. Band color __yellow____

## 2021-12-25 DIAGNOSIS — C50212 Malignant neoplasm of upper-inner quadrant of left female breast: Secondary | ICD-10-CM | POA: Diagnosis not present

## 2021-12-25 DIAGNOSIS — Z170 Estrogen receptor positive status [ER+]: Secondary | ICD-10-CM | POA: Diagnosis not present

## 2021-12-31 ENCOUNTER — Ambulatory Visit: Payer: Medicare HMO

## 2021-12-31 ENCOUNTER — Ambulatory Visit: Payer: Medicare HMO | Admitting: Radiation Oncology

## 2021-12-31 ENCOUNTER — Encounter: Payer: Self-pay | Admitting: *Deleted

## 2021-12-31 ENCOUNTER — Other Ambulatory Visit: Payer: Self-pay | Admitting: Family Medicine

## 2021-12-31 DIAGNOSIS — G610 Guillain-Barre syndrome: Secondary | ICD-10-CM | POA: Diagnosis not present

## 2021-12-31 DIAGNOSIS — C50919 Malignant neoplasm of unspecified site of unspecified female breast: Secondary | ICD-10-CM | POA: Diagnosis not present

## 2021-12-31 DIAGNOSIS — Z8249 Family history of ischemic heart disease and other diseases of the circulatory system: Secondary | ICD-10-CM

## 2021-12-31 DIAGNOSIS — I341 Nonrheumatic mitral (valve) prolapse: Secondary | ICD-10-CM | POA: Diagnosis not present

## 2021-12-31 DIAGNOSIS — K589 Irritable bowel syndrome without diarrhea: Secondary | ICD-10-CM | POA: Diagnosis not present

## 2021-12-31 DIAGNOSIS — Z0000 Encounter for general adult medical examination without abnormal findings: Secondary | ICD-10-CM | POA: Diagnosis not present

## 2021-12-31 DIAGNOSIS — R7309 Other abnormal glucose: Secondary | ICD-10-CM | POA: Diagnosis not present

## 2021-12-31 DIAGNOSIS — E7800 Pure hypercholesterolemia, unspecified: Secondary | ICD-10-CM | POA: Diagnosis not present

## 2021-12-31 DIAGNOSIS — I10 Essential (primary) hypertension: Secondary | ICD-10-CM | POA: Diagnosis not present

## 2022-01-01 ENCOUNTER — Ambulatory Visit: Payer: Medicare HMO

## 2022-01-05 ENCOUNTER — Other Ambulatory Visit: Payer: Self-pay

## 2022-01-05 ENCOUNTER — Ambulatory Visit
Admission: RE | Admit: 2022-01-05 | Discharge: 2022-01-05 | Disposition: A | Discharge status: Home / Self Care | Payer: Medicare Other | Source: Ambulatory Visit | Attending: Radiation Oncology | Admitting: Radiation Oncology

## 2022-01-05 ENCOUNTER — Ambulatory Visit: Payer: Medicare HMO

## 2022-01-05 DIAGNOSIS — Z170 Estrogen receptor positive status [ER+]: Secondary | ICD-10-CM | POA: Insufficient documentation

## 2022-01-05 DIAGNOSIS — C50212 Malignant neoplasm of upper-inner quadrant of left female breast: Secondary | ICD-10-CM | POA: Insufficient documentation

## 2022-01-05 DIAGNOSIS — Z510 Encounter for antineoplastic radiation therapy: Secondary | ICD-10-CM | POA: Diagnosis not present

## 2022-01-05 LAB — RAD ONC ARIA SESSION SUMMARY
Course Elapsed Days: 0
Plan Fractions Treated to Date: 1
Plan Prescribed Dose Per Fraction: 2.66 Gy
Plan Total Fractions Prescribed: 16
Plan Total Prescribed Dose: 42.56 Gy
Reference Point Dosage Given to Date: 2.66 Gy
Reference Point Session Dosage Given: 2.66 Gy
Session Number: 1

## 2022-01-06 ENCOUNTER — Other Ambulatory Visit: Payer: Self-pay

## 2022-01-06 ENCOUNTER — Ambulatory Visit
Admission: RE | Admit: 2022-01-06 | Discharge: 2022-01-06 | Disposition: A | Discharge status: Home / Self Care | Payer: Medicare Other | Source: Ambulatory Visit | Attending: Radiation Oncology | Admitting: Radiation Oncology

## 2022-01-06 DIAGNOSIS — Z170 Estrogen receptor positive status [ER+]: Secondary | ICD-10-CM | POA: Diagnosis not present

## 2022-01-06 DIAGNOSIS — C50212 Malignant neoplasm of upper-inner quadrant of left female breast: Secondary | ICD-10-CM | POA: Diagnosis not present

## 2022-01-06 DIAGNOSIS — Z510 Encounter for antineoplastic radiation therapy: Secondary | ICD-10-CM | POA: Diagnosis not present

## 2022-01-06 LAB — RAD ONC ARIA SESSION SUMMARY
Course Elapsed Days: 1
Plan Fractions Treated to Date: 2
Plan Prescribed Dose Per Fraction: 2.66 Gy
Plan Total Fractions Prescribed: 16
Plan Total Prescribed Dose: 42.56 Gy
Reference Point Dosage Given to Date: 5.32 Gy
Reference Point Session Dosage Given: 2.66 Gy
Session Number: 2

## 2022-01-07 ENCOUNTER — Other Ambulatory Visit: Payer: Self-pay

## 2022-01-07 ENCOUNTER — Ambulatory Visit
Admission: RE | Admit: 2022-01-07 | Discharge: 2022-01-07 | Disposition: A | Discharge status: Home / Self Care | Payer: Medicare Other | Source: Ambulatory Visit | Attending: Radiation Oncology | Admitting: Radiation Oncology

## 2022-01-07 ENCOUNTER — Encounter: Payer: Medicare HMO | Admitting: Rehabilitation

## 2022-01-07 DIAGNOSIS — C50212 Malignant neoplasm of upper-inner quadrant of left female breast: Secondary | ICD-10-CM | POA: Diagnosis not present

## 2022-01-07 DIAGNOSIS — Z170 Estrogen receptor positive status [ER+]: Secondary | ICD-10-CM | POA: Diagnosis not present

## 2022-01-07 DIAGNOSIS — Z510 Encounter for antineoplastic radiation therapy: Secondary | ICD-10-CM | POA: Diagnosis not present

## 2022-01-07 LAB — RAD ONC ARIA SESSION SUMMARY
Course Elapsed Days: 2
Plan Fractions Treated to Date: 3
Plan Prescribed Dose Per Fraction: 2.66 Gy
Plan Total Fractions Prescribed: 16
Plan Total Prescribed Dose: 42.56 Gy
Reference Point Dosage Given to Date: 7.98 Gy
Reference Point Session Dosage Given: 2.66 Gy
Session Number: 3

## 2022-01-08 ENCOUNTER — Ambulatory Visit
Admission: RE | Admit: 2022-01-08 | Discharge: 2022-01-08 | Disposition: A | Discharge status: Home / Self Care | Payer: Medicare Other | Source: Ambulatory Visit | Attending: Radiation Oncology | Admitting: Radiation Oncology

## 2022-01-08 ENCOUNTER — Other Ambulatory Visit: Payer: Self-pay

## 2022-01-08 DIAGNOSIS — C50212 Malignant neoplasm of upper-inner quadrant of left female breast: Secondary | ICD-10-CM

## 2022-01-08 DIAGNOSIS — Z510 Encounter for antineoplastic radiation therapy: Secondary | ICD-10-CM | POA: Diagnosis not present

## 2022-01-08 DIAGNOSIS — Z170 Estrogen receptor positive status [ER+]: Secondary | ICD-10-CM | POA: Diagnosis not present

## 2022-01-08 LAB — RAD ONC ARIA SESSION SUMMARY
Course Elapsed Days: 3
Plan Fractions Treated to Date: 4
Plan Prescribed Dose Per Fraction: 2.66 Gy
Plan Total Fractions Prescribed: 16
Plan Total Prescribed Dose: 42.56 Gy
Reference Point Dosage Given to Date: 10.64 Gy
Reference Point Session Dosage Given: 2.66 Gy
Session Number: 4

## 2022-01-08 MED ORDER — ALRA NON-METALLIC DEODORANT (RAD-ONC)
1.0000 | Freq: Once | TOPICAL | Status: AC
  Administered 2022-01-08: 1 via TOPICAL

## 2022-01-08 MED ORDER — RADIAPLEXRX EX GEL: Freq: Once | CUTANEOUS | Status: AC

## 2022-01-11 ENCOUNTER — Other Ambulatory Visit: Payer: Self-pay

## 2022-01-11 ENCOUNTER — Ambulatory Visit
Admission: RE | Admit: 2022-01-11 | Discharge: 2022-01-11 | Disposition: A | Discharge status: Home / Self Care | Payer: Medicare Other | Source: Ambulatory Visit | Attending: Radiation Oncology | Admitting: Radiation Oncology

## 2022-01-11 DIAGNOSIS — Z510 Encounter for antineoplastic radiation therapy: Secondary | ICD-10-CM | POA: Diagnosis not present

## 2022-01-11 DIAGNOSIS — Z170 Estrogen receptor positive status [ER+]: Secondary | ICD-10-CM | POA: Diagnosis not present

## 2022-01-11 DIAGNOSIS — C50212 Malignant neoplasm of upper-inner quadrant of left female breast: Secondary | ICD-10-CM | POA: Diagnosis not present

## 2022-01-11 LAB — RAD ONC ARIA SESSION SUMMARY
Course Elapsed Days: 6
Plan Fractions Treated to Date: 5
Plan Prescribed Dose Per Fraction: 2.66 Gy
Plan Total Fractions Prescribed: 16
Plan Total Prescribed Dose: 42.56 Gy
Reference Point Dosage Given to Date: 13.3 Gy
Reference Point Session Dosage Given: 2.66 Gy
Session Number: 5

## 2022-01-12 ENCOUNTER — Other Ambulatory Visit: Payer: Self-pay

## 2022-01-12 ENCOUNTER — Ambulatory Visit
Admission: RE | Admit: 2022-01-12 | Discharge: 2022-01-12 | Disposition: A | Discharge status: Home / Self Care | Payer: Medicare Other | Source: Ambulatory Visit | Attending: Radiation Oncology | Admitting: Radiation Oncology

## 2022-01-12 DIAGNOSIS — Z510 Encounter for antineoplastic radiation therapy: Secondary | ICD-10-CM | POA: Diagnosis not present

## 2022-01-12 DIAGNOSIS — Z170 Estrogen receptor positive status [ER+]: Secondary | ICD-10-CM | POA: Diagnosis not present

## 2022-01-12 DIAGNOSIS — C50212 Malignant neoplasm of upper-inner quadrant of left female breast: Secondary | ICD-10-CM | POA: Diagnosis not present

## 2022-01-12 LAB — RAD ONC ARIA SESSION SUMMARY
Course Elapsed Days: 7
Plan Fractions Treated to Date: 6
Plan Prescribed Dose Per Fraction: 2.66 Gy
Plan Total Fractions Prescribed: 16
Plan Total Prescribed Dose: 42.56 Gy
Reference Point Dosage Given to Date: 15.96 Gy
Reference Point Session Dosage Given: 2.66 Gy
Session Number: 6

## 2022-01-13 ENCOUNTER — Other Ambulatory Visit: Payer: Self-pay

## 2022-01-13 ENCOUNTER — Ambulatory Visit
Admission: RE | Admit: 2022-01-13 | Discharge: 2022-01-13 | Disposition: A | Discharge status: Home / Self Care | Payer: Medicare Other | Source: Ambulatory Visit | Attending: Radiation Oncology | Admitting: Radiation Oncology

## 2022-01-13 DIAGNOSIS — Z170 Estrogen receptor positive status [ER+]: Secondary | ICD-10-CM | POA: Diagnosis not present

## 2022-01-13 DIAGNOSIS — C50212 Malignant neoplasm of upper-inner quadrant of left female breast: Secondary | ICD-10-CM | POA: Diagnosis not present

## 2022-01-13 DIAGNOSIS — Z510 Encounter for antineoplastic radiation therapy: Secondary | ICD-10-CM | POA: Diagnosis not present

## 2022-01-13 LAB — RAD ONC ARIA SESSION SUMMARY
Course Elapsed Days: 8
Plan Fractions Treated to Date: 7
Plan Prescribed Dose Per Fraction: 2.66 Gy
Plan Total Fractions Prescribed: 16
Plan Total Prescribed Dose: 42.56 Gy
Reference Point Dosage Given to Date: 18.62 Gy
Reference Point Session Dosage Given: 2.66 Gy
Session Number: 7

## 2022-01-14 ENCOUNTER — Other Ambulatory Visit: Payer: Self-pay

## 2022-01-14 ENCOUNTER — Ambulatory Visit
Admission: RE | Admit: 2022-01-14 | Discharge: 2022-01-14 | Disposition: A | Discharge status: Home / Self Care | Payer: Medicare Other | Source: Ambulatory Visit | Attending: Radiation Oncology | Admitting: Radiation Oncology

## 2022-01-14 DIAGNOSIS — C50212 Malignant neoplasm of upper-inner quadrant of left female breast: Secondary | ICD-10-CM | POA: Diagnosis not present

## 2022-01-14 DIAGNOSIS — Z170 Estrogen receptor positive status [ER+]: Secondary | ICD-10-CM | POA: Diagnosis not present

## 2022-01-14 DIAGNOSIS — Z510 Encounter for antineoplastic radiation therapy: Secondary | ICD-10-CM | POA: Diagnosis not present

## 2022-01-14 LAB — RAD ONC ARIA SESSION SUMMARY
Course Elapsed Days: 9
Plan Fractions Treated to Date: 8
Plan Prescribed Dose Per Fraction: 2.66 Gy
Plan Total Fractions Prescribed: 16
Plan Total Prescribed Dose: 42.56 Gy
Reference Point Dosage Given to Date: 21.28 Gy
Reference Point Session Dosage Given: 2.66 Gy
Session Number: 8

## 2022-01-15 ENCOUNTER — Ambulatory Visit
Admission: RE | Admit: 2022-01-15 | Discharge: 2022-01-15 | Disposition: A | Discharge status: Home / Self Care | Payer: Medicare Other | Source: Ambulatory Visit | Attending: Radiation Oncology | Admitting: Radiation Oncology

## 2022-01-15 ENCOUNTER — Other Ambulatory Visit: Payer: Self-pay

## 2022-01-15 DIAGNOSIS — Z510 Encounter for antineoplastic radiation therapy: Secondary | ICD-10-CM | POA: Diagnosis not present

## 2022-01-15 DIAGNOSIS — Z170 Estrogen receptor positive status [ER+]: Secondary | ICD-10-CM | POA: Diagnosis not present

## 2022-01-15 DIAGNOSIS — C50212 Malignant neoplasm of upper-inner quadrant of left female breast: Secondary | ICD-10-CM | POA: Diagnosis not present

## 2022-01-15 LAB — RAD ONC ARIA SESSION SUMMARY
Course Elapsed Days: 10
Plan Fractions Treated to Date: 9
Plan Prescribed Dose Per Fraction: 2.66 Gy
Plan Total Fractions Prescribed: 16
Plan Total Prescribed Dose: 42.56 Gy
Reference Point Dosage Given to Date: 23.94 Gy
Reference Point Session Dosage Given: 2.66 Gy
Session Number: 9

## 2022-01-18 ENCOUNTER — Ambulatory Visit
Admission: RE | Admit: 2022-01-18 | Discharge: 2022-01-18 | Disposition: A | Discharge status: Home / Self Care | Payer: Medicare Other | Source: Ambulatory Visit | Attending: Radiation Oncology | Admitting: Radiation Oncology

## 2022-01-18 ENCOUNTER — Other Ambulatory Visit: Payer: Self-pay

## 2022-01-18 DIAGNOSIS — C50212 Malignant neoplasm of upper-inner quadrant of left female breast: Secondary | ICD-10-CM | POA: Diagnosis not present

## 2022-01-18 DIAGNOSIS — Z510 Encounter for antineoplastic radiation therapy: Secondary | ICD-10-CM | POA: Diagnosis not present

## 2022-01-18 DIAGNOSIS — Z170 Estrogen receptor positive status [ER+]: Secondary | ICD-10-CM | POA: Diagnosis not present

## 2022-01-18 LAB — RAD ONC ARIA SESSION SUMMARY
Course Elapsed Days: 13
Plan Fractions Treated to Date: 10
Plan Prescribed Dose Per Fraction: 2.66 Gy
Plan Total Fractions Prescribed: 16
Plan Total Prescribed Dose: 42.56 Gy
Reference Point Dosage Given to Date: 26.6 Gy
Reference Point Session Dosage Given: 2.66 Gy
Session Number: 10

## 2022-01-19 ENCOUNTER — Other Ambulatory Visit: Payer: Self-pay

## 2022-01-19 ENCOUNTER — Ambulatory Visit
Admission: RE | Admit: 2022-01-19 | Discharge: 2022-01-19 | Disposition: A | Discharge status: Home / Self Care | Payer: Medicare Other | Source: Ambulatory Visit | Attending: Radiation Oncology | Admitting: Radiation Oncology

## 2022-01-19 DIAGNOSIS — Z170 Estrogen receptor positive status [ER+]: Secondary | ICD-10-CM | POA: Diagnosis not present

## 2022-01-19 DIAGNOSIS — C50212 Malignant neoplasm of upper-inner quadrant of left female breast: Secondary | ICD-10-CM | POA: Diagnosis not present

## 2022-01-19 DIAGNOSIS — Z510 Encounter for antineoplastic radiation therapy: Secondary | ICD-10-CM | POA: Diagnosis not present

## 2022-01-19 LAB — RAD ONC ARIA SESSION SUMMARY
Course Elapsed Days: 14
Plan Fractions Treated to Date: 11
Plan Prescribed Dose Per Fraction: 2.66 Gy
Plan Total Fractions Prescribed: 16
Plan Total Prescribed Dose: 42.56 Gy
Reference Point Dosage Given to Date: 29.26 Gy
Reference Point Session Dosage Given: 2.66 Gy
Session Number: 11

## 2022-01-20 ENCOUNTER — Encounter: Payer: Medicare HMO | Admitting: Physical Therapy

## 2022-01-20 ENCOUNTER — Other Ambulatory Visit: Payer: Self-pay

## 2022-01-20 ENCOUNTER — Ambulatory Visit
Admission: RE | Admit: 2022-01-20 | Discharge: 2022-01-20 | Disposition: A | Discharge status: Home / Self Care | Payer: Medicare Other | Source: Ambulatory Visit | Attending: Radiation Oncology | Admitting: Radiation Oncology

## 2022-01-20 DIAGNOSIS — Z510 Encounter for antineoplastic radiation therapy: Secondary | ICD-10-CM | POA: Diagnosis not present

## 2022-01-20 DIAGNOSIS — C50212 Malignant neoplasm of upper-inner quadrant of left female breast: Secondary | ICD-10-CM | POA: Diagnosis not present

## 2022-01-20 DIAGNOSIS — Z170 Estrogen receptor positive status [ER+]: Secondary | ICD-10-CM | POA: Diagnosis not present

## 2022-01-20 LAB — RAD ONC ARIA SESSION SUMMARY
Course Elapsed Days: 15
Plan Fractions Treated to Date: 12
Plan Prescribed Dose Per Fraction: 2.66 Gy
Plan Total Fractions Prescribed: 16
Plan Total Prescribed Dose: 42.56 Gy
Reference Point Dosage Given to Date: 31.92 Gy
Reference Point Session Dosage Given: 2.66 Gy
Session Number: 12

## 2022-01-21 ENCOUNTER — Other Ambulatory Visit: Payer: Self-pay

## 2022-01-21 ENCOUNTER — Ambulatory Visit
Admission: RE | Admit: 2022-01-21 | Discharge: 2022-01-21 | Disposition: A | Discharge status: Home / Self Care | Payer: Medicare Other | Source: Ambulatory Visit | Attending: Radiation Oncology | Admitting: Radiation Oncology

## 2022-01-21 DIAGNOSIS — Z510 Encounter for antineoplastic radiation therapy: Secondary | ICD-10-CM | POA: Diagnosis not present

## 2022-01-21 DIAGNOSIS — C50212 Malignant neoplasm of upper-inner quadrant of left female breast: Secondary | ICD-10-CM | POA: Diagnosis not present

## 2022-01-21 DIAGNOSIS — Z170 Estrogen receptor positive status [ER+]: Secondary | ICD-10-CM | POA: Diagnosis not present

## 2022-01-21 LAB — RAD ONC ARIA SESSION SUMMARY
Course Elapsed Days: 16
Plan Fractions Treated to Date: 13
Plan Prescribed Dose Per Fraction: 2.66 Gy
Plan Total Fractions Prescribed: 16
Plan Total Prescribed Dose: 42.56 Gy
Reference Point Dosage Given to Date: 34.58 Gy
Reference Point Session Dosage Given: 2.66 Gy
Session Number: 13

## 2022-01-22 ENCOUNTER — Ambulatory Visit: Payer: Medicare Other

## 2022-01-22 ENCOUNTER — Ambulatory Visit: Payer: Medicare Other | Admitting: Radiation Oncology

## 2022-01-22 ENCOUNTER — Ambulatory Visit: Payer: Medicare HMO

## 2022-01-22 ENCOUNTER — Ambulatory Visit
Admission: RE | Admit: 2022-01-22 | Discharge: 2022-01-22 | Disposition: A | Discharge status: Home / Self Care | Payer: Medicare Other | Source: Ambulatory Visit | Attending: Radiation Oncology | Admitting: Radiation Oncology

## 2022-01-22 ENCOUNTER — Other Ambulatory Visit: Payer: Self-pay

## 2022-01-22 DIAGNOSIS — C50212 Malignant neoplasm of upper-inner quadrant of left female breast: Secondary | ICD-10-CM | POA: Diagnosis not present

## 2022-01-22 DIAGNOSIS — Z170 Estrogen receptor positive status [ER+]: Secondary | ICD-10-CM | POA: Diagnosis not present

## 2022-01-22 DIAGNOSIS — Z510 Encounter for antineoplastic radiation therapy: Secondary | ICD-10-CM | POA: Diagnosis not present

## 2022-01-22 LAB — RAD ONC ARIA SESSION SUMMARY
Course Elapsed Days: 17
Plan Fractions Treated to Date: 14
Plan Prescribed Dose Per Fraction: 2.66 Gy
Plan Total Fractions Prescribed: 16
Plan Total Prescribed Dose: 42.56 Gy
Reference Point Dosage Given to Date: 37.24 Gy
Reference Point Session Dosage Given: 2.66 Gy
Session Number: 14

## 2022-01-22 MED ORDER — RADIAPLEXRX EX GEL: Freq: Once | CUTANEOUS | Status: AC

## 2022-01-25 ENCOUNTER — Ambulatory Visit: Payer: Medicare Other

## 2022-01-25 ENCOUNTER — Other Ambulatory Visit: Payer: Self-pay

## 2022-01-25 ENCOUNTER — Ambulatory Visit
Admission: RE | Admit: 2022-01-25 | Discharge: 2022-01-25 | Disposition: A | Discharge status: Home / Self Care | Payer: Medicare Other | Source: Ambulatory Visit | Attending: Radiation Oncology | Admitting: Radiation Oncology

## 2022-01-25 DIAGNOSIS — Z510 Encounter for antineoplastic radiation therapy: Secondary | ICD-10-CM | POA: Diagnosis not present

## 2022-01-25 DIAGNOSIS — C50212 Malignant neoplasm of upper-inner quadrant of left female breast: Secondary | ICD-10-CM | POA: Diagnosis not present

## 2022-01-25 DIAGNOSIS — Z170 Estrogen receptor positive status [ER+]: Secondary | ICD-10-CM | POA: Diagnosis not present

## 2022-01-25 LAB — RAD ONC ARIA SESSION SUMMARY
Course Elapsed Days: 20
Plan Fractions Treated to Date: 15
Plan Prescribed Dose Per Fraction: 2.66 Gy
Plan Total Fractions Prescribed: 16
Plan Total Prescribed Dose: 42.56 Gy
Reference Point Dosage Given to Date: 39.9 Gy
Reference Point Session Dosage Given: 2.66 Gy
Session Number: 15

## 2022-01-26 ENCOUNTER — Other Ambulatory Visit: Payer: Self-pay

## 2022-01-26 ENCOUNTER — Ambulatory Visit
Admission: RE | Admit: 2022-01-26 | Discharge: 2022-01-26 | Disposition: A | Discharge status: Home / Self Care | Payer: Medicare Other | Source: Ambulatory Visit | Attending: Radiation Oncology | Admitting: Radiation Oncology

## 2022-01-26 DIAGNOSIS — Z510 Encounter for antineoplastic radiation therapy: Secondary | ICD-10-CM | POA: Diagnosis not present

## 2022-01-26 DIAGNOSIS — C50212 Malignant neoplasm of upper-inner quadrant of left female breast: Secondary | ICD-10-CM | POA: Diagnosis not present

## 2022-01-26 DIAGNOSIS — Z170 Estrogen receptor positive status [ER+]: Secondary | ICD-10-CM | POA: Diagnosis not present

## 2022-01-26 LAB — RAD ONC ARIA SESSION SUMMARY
Course Elapsed Days: 21
Plan Fractions Treated to Date: 16
Plan Prescribed Dose Per Fraction: 2.66 Gy
Plan Total Fractions Prescribed: 16
Plan Total Prescribed Dose: 42.56 Gy
Reference Point Dosage Given to Date: 42.56 Gy
Reference Point Session Dosage Given: 2.66 Gy
Session Number: 16

## 2022-01-27 ENCOUNTER — Ambulatory Visit
Admission: RE | Admit: 2022-01-27 | Discharge: 2022-01-27 | Disposition: A | Discharge status: Home / Self Care | Payer: Medicare Other | Source: Ambulatory Visit | Attending: Radiation Oncology | Admitting: Radiation Oncology

## 2022-01-27 ENCOUNTER — Other Ambulatory Visit: Payer: Self-pay

## 2022-01-27 ENCOUNTER — Ambulatory Visit: Payer: Medicare Other

## 2022-01-27 DIAGNOSIS — Z170 Estrogen receptor positive status [ER+]: Secondary | ICD-10-CM | POA: Diagnosis not present

## 2022-01-27 DIAGNOSIS — C50212 Malignant neoplasm of upper-inner quadrant of left female breast: Secondary | ICD-10-CM | POA: Diagnosis not present

## 2022-01-27 DIAGNOSIS — Z510 Encounter for antineoplastic radiation therapy: Secondary | ICD-10-CM | POA: Diagnosis not present

## 2022-01-27 LAB — RAD ONC ARIA SESSION SUMMARY
Course Elapsed Days: 22
Plan Fractions Treated to Date: 1
Plan Prescribed Dose Per Fraction: 2 Gy
Plan Total Fractions Prescribed: 4
Plan Total Prescribed Dose: 8 Gy
Reference Point Dosage Given to Date: 2 Gy
Reference Point Session Dosage Given: 2 Gy
Session Number: 17

## 2022-01-28 ENCOUNTER — Ambulatory Visit
Admission: RE | Admit: 2022-01-28 | Discharge: 2022-01-28 | Disposition: A | Discharge status: Home / Self Care | Payer: Medicare Other | Source: Ambulatory Visit | Attending: Radiation Oncology | Admitting: Radiation Oncology

## 2022-01-28 ENCOUNTER — Ambulatory Visit: Payer: Medicare Other

## 2022-01-28 ENCOUNTER — Other Ambulatory Visit: Payer: Self-pay

## 2022-01-28 DIAGNOSIS — Z170 Estrogen receptor positive status [ER+]: Secondary | ICD-10-CM | POA: Diagnosis not present

## 2022-01-28 DIAGNOSIS — C50212 Malignant neoplasm of upper-inner quadrant of left female breast: Secondary | ICD-10-CM | POA: Diagnosis not present

## 2022-01-28 LAB — RAD ONC ARIA SESSION SUMMARY
Course Elapsed Days: 23
Plan Fractions Treated to Date: 2
Plan Prescribed Dose Per Fraction: 2 Gy
Plan Total Fractions Prescribed: 4
Plan Total Prescribed Dose: 8 Gy
Reference Point Dosage Given to Date: 4 Gy
Reference Point Session Dosage Given: 2 Gy
Session Number: 18

## 2022-01-29 ENCOUNTER — Other Ambulatory Visit: Payer: Self-pay

## 2022-01-29 ENCOUNTER — Ambulatory Visit
Admission: RE | Admit: 2022-01-29 | Discharge: 2022-01-29 | Disposition: A | Discharge status: Home / Self Care | Payer: Medicare Other | Source: Ambulatory Visit | Attending: Radiation Oncology | Admitting: Radiation Oncology

## 2022-01-29 DIAGNOSIS — Z170 Estrogen receptor positive status [ER+]: Secondary | ICD-10-CM

## 2022-01-29 DIAGNOSIS — C50212 Malignant neoplasm of upper-inner quadrant of left female breast: Secondary | ICD-10-CM | POA: Diagnosis not present

## 2022-01-29 LAB — RAD ONC ARIA SESSION SUMMARY
Course Elapsed Days: 24
Plan Fractions Treated to Date: 3
Plan Prescribed Dose Per Fraction: 2 Gy
Plan Total Fractions Prescribed: 4
Plan Total Prescribed Dose: 8 Gy
Reference Point Dosage Given to Date: 6 Gy
Reference Point Session Dosage Given: 2 Gy
Session Number: 19

## 2022-01-29 MED ORDER — RADIAPLEXRX EX GEL: Freq: Once | CUTANEOUS | Status: AC

## 2022-02-01 ENCOUNTER — Encounter: Payer: Self-pay | Admitting: *Deleted

## 2022-02-01 ENCOUNTER — Ambulatory Visit
Admission: RE | Admit: 2022-02-01 | Discharge: 2022-02-01 | Disposition: A | Discharge status: Home / Self Care | Payer: Medicare Other | Source: Ambulatory Visit | Attending: Radiation Oncology | Admitting: Radiation Oncology

## 2022-02-01 ENCOUNTER — Other Ambulatory Visit: Payer: Self-pay

## 2022-02-01 ENCOUNTER — Inpatient Hospital Stay: Payer: Medicare Other | Admitting: Hematology and Oncology

## 2022-02-01 ENCOUNTER — Encounter: Payer: Self-pay | Admitting: Radiation Oncology

## 2022-02-01 DIAGNOSIS — C50212 Malignant neoplasm of upper-inner quadrant of left female breast: Secondary | ICD-10-CM | POA: Diagnosis not present

## 2022-02-01 DIAGNOSIS — Z170 Estrogen receptor positive status [ER+]: Secondary | ICD-10-CM | POA: Diagnosis not present

## 2022-02-01 LAB — RAD ONC ARIA SESSION SUMMARY
Course Elapsed Days: 27
Plan Fractions Treated to Date: 4
Plan Prescribed Dose Per Fraction: 2 Gy
Plan Total Fractions Prescribed: 4
Plan Total Prescribed Dose: 8 Gy
Reference Point Dosage Given to Date: 8 Gy
Reference Point Session Dosage Given: 2 Gy
Session Number: 20

## 2022-02-02 NOTE — Progress Notes (Signed)
                                                                                                                                                             Patient Name: Janice Gross MRN: [id number] DOB: 05-15-1955 Referring Physician: PA [facility] Date of Service: 02/01/2022 [facility], [place]                                                        End Of Treatment Note  Diagnoses: C50.212-Malignant neoplasm of upper-inner quadrant of left female breast  Cancer Staging: Stage IA, pT2N0M0, grade 2, ER/PR positive invasive ductal carcinoma of the left breast.   Intent: Curative  Radiation Treatment Dates: 01/05/2022 through 02/01/2022 Site Technique Total Dose (Gy) Dose per Fx (Gy) Completed Fx Beam Energies  Breast, Left: Breast_L 3D 42.56/42.56 2.66 16/16 10XFFF  Breast, Left: Breast_L_Bst 3D 8/8 2 4/4 6X, 10X   Narrative: The patient tolerated radiation therapy relatively well. She developed fatigue and anticipated skin changes in the treatment field. She did not have desquamation at the conclusion of treatment.  Plan: The patient will receive a call in about one month from the radiation oncology department. She will continue follow up with Dr. Chryl Heck as well.   ________________________________________________    Carola Rhine, [facility]

## 2022-02-03 ENCOUNTER — Encounter: Payer: Self-pay | Admitting: Hematology and Oncology

## 2022-02-03 ENCOUNTER — Other Ambulatory Visit: Payer: Self-pay

## 2022-02-03 ENCOUNTER — Inpatient Hospital Stay: Payer: Medicare Other | Attending: Hematology and Oncology | Admitting: Hematology and Oncology

## 2022-02-03 VITALS — BP 167/85 | HR 61 | Temp 97.3°F | Resp 19 | Wt 156.2 lb

## 2022-02-03 DIAGNOSIS — C50212 Malignant neoplasm of upper-inner quadrant of left female breast: Secondary | ICD-10-CM

## 2022-02-03 DIAGNOSIS — Z923 Personal history of irradiation: Secondary | ICD-10-CM | POA: Diagnosis not present

## 2022-02-03 DIAGNOSIS — Z79811 Long term (current) use of aromatase inhibitors: Secondary | ICD-10-CM | POA: Diagnosis not present

## 2022-02-03 DIAGNOSIS — Z170 Estrogen receptor positive status [ER+]: Secondary | ICD-10-CM

## 2022-02-03 MED ORDER — ANASTROZOLE 1 MG PO TABS: 1.0000 mg | ORAL_TABLET | Freq: Every day | ORAL | 3 refills | Status: DC

## 2022-02-03 NOTE — Progress Notes (Signed)
Janice Gross NOTE  Patient Care Team: Pa, [place] as PCP - General [name], MD as Consulting Physician (General Surgery) Benay Pike, MD as Consulting Physician (Hematology and Oncology) Kyung Rudd, MD as Consulting Physician (Radiation Oncology) Mauro Kaufmann, RN as Oncology Nurse Navigator Rockwell Germany, RN as Oncology Nurse Navigator  CHIEF COMPLAINTS/PURPOSE OF CONSULTATION:  Breast cancer follow up  HISTORY OF PRESENTING ILLNESS:  Janice Gross 67 y.o. female is here because of recent diagnosis of left breast cancer  I reviewed her records extensively and collaborated the history with the patient.  SUMMARY OF ONCOLOGIC HISTORY: Oncology History  Malignant neoplasm of upper-inner quadrant of left breast in female, estrogen receptor positive ([place])  10/10/2021 Mammogram   Screening mammogram showed loosely grouped calcifications in the right breast at 3:00 posterior depth, indeterminate, additional views are recommended.  Asymmetry in the left breast posterior depth superior region seen on the mediolateral oblique view only is indeterminate.  Diagnostic mammogram showed a 1 cm irregular mass with calcifications in the left breast, ultrasound was recommended.  Ultrasound confirmed a 1.2 x 0.5 cm irregular mass suspicious for malignancy.  No significant abnormalities in the axilla   10/29/2021 Pathology Results   Pathology results from the left breast biopsy showed invasive ductal carcinoma overall grade 2, prognostic showed ER 95% positive strong staining PR 40% positive strong staining, Ki-67 of 10% tumor cells are negative for HER2 1+ by [facility]   11/03/2021 Initial Diagnosis   Malignant neoplasm of upper-inner quadrant of left breast in female, estrogen receptor positive ([place])   11/04/2021 Cancer Staging   Staging form: Breast, AJCC 8th Edition - Clinical stage from 11/04/2021: Stage IA (cT1c, cN0, cM0, G2, ER+, PR+,  HER2-) - Signed by Hayden Pedro, PA-C on 11/04/2021 Stage prefix: Initial diagnosis Method of lymph node assessment: Clinical Histologic grading system: 3 grade system   12/21/2021 Cancer Staging   Staging form: Breast, AJCC 8th Edition - Pathologic: Stage IA (pT2, pN0(sn), cM0, G2, ER+, PR+, HER2-, Oncotype DX score: 19) - Signed by Hayden Pedro, PA-C on 12/21/2021 Stage prefix: Initial diagnosis Method of lymph node assessment: Sentinel lymph node biopsy Multigene prognostic tests performed: Oncotype DX Recurrence score range: Greater than or equal to 11 Histologic grading system: 3 grade system    Interval history  She is here for follow up. She completed radiation 02/01/2022. She says it went well except for the last week. She started noticing skin redness, stinging, raw feeling.  She is here to discuss anti estrogen therapy.  Rest of the pertinent 10 point ROS and stable  MEDICAL HISTORY:  Past Medical History:  Diagnosis Date   Allergic rhinitis    Anemia    pt states she has never heard that she had this   Anxiety    Constipation    Family history of adverse reaction to anesthesia    niece is hard to put to sleep and "sensitive to anesthesia"   GERD (gastroesophageal reflux disease)    Gestational diabetes    Headache(784.0)    Hypertension    IBS (irritable bowel syndrome)    Mitral valve prolapse    Neck pain    Neuromuscular disorder ([place])    Pt reports takign FLU shot in the 90s which caused leg numbmess which has gone away, no tingling "from time to time"   Palpitations     SURGICAL HISTORY: Past Surgical History:  Procedure Laterality Date   BREAST  LUMPECTOMY WITH RADIOACTIVE SEED AND SENTINEL LYMPH NODE BIOPSY Left 11/12/2021   Procedure: LEFT BREAST BRACKETED LUMPECTOMY WITH RADIOACTIVE SEED X2 AND SENTINEL LYMPH NODE BIOPSY;  Surgeon: [name], MD;  Location: [facility];  Service: General;  Laterality: Left;  56 MIN ROOM 2   c  section x 1     COLONOSCOPY  2006   Dr. Isa Rankin    LAPAROSCOPY     surgery to remove ovarian cyst      SOCIAL HISTORY: Social History   Socioeconomic History   Marital status: Divorced    Spouse name: Not on file   Number of children: 1   Years of education: Not on file   Highest education level: Not on file  Occupational History   Occupation: Marine scientist: [employer]  Tobacco Use   Smoking status: Never   Smokeless tobacco: Never   Tobacco comments:    tried as a teenager  Scientific laboratory technician Use: Never used  Substance and Sexual Activity   Alcohol use: No    Alcohol/week: 0.0 standard drinks of alcohol   Drug use: No   Sexual activity: Not Currently    Birth control/protection: None  Other Topics Concern   Not on file  Social History Narrative   Work or School: Engineer, 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 - reports loves her job      Home Situation: lives alone      Spiritual Beliefs: Christian      Lifestyle:walking on a regular basis; diet is fair         Social Determinants of Health   Financial Resource Strain: Low Risk  (11/04/2021)   Overall Financial Resource Strain (CARDIA)    Difficulty of Paying Living Expenses: Not very hard  Food Insecurity: No Food Insecurity (11/04/2021)   Hunger Vital Sign    Worried About Running Out of Food in the Last Year: Never true    [place] in the Last Year: Never true  Transportation Needs: No Transportation Needs (11/04/2021)   PRAPARE - Hydrologist (Medical): No    Lack of Transportation (Non-Medical): No  Physical Activity: Not on file  Stress: Not on file  Social Connections: Not on file  Intimate Partner Violence: Not on file    FAMILY HISTORY: Family History  Problem Relation Age of Onset   Breast cancer Sister 57   Diabetes Sister    Breast cancer Sister 30   Diabetes Sister    Breast cancer Mother    Ovarian cancer  Sister 72   Heart disease Father    Diabetes Brother    Lung cancer Brother    Breast cancer Other        dx in her 50s/dx in her 16s   Colon cancer Other 44   Lung cancer Other        dx in his 14s   Stomach cancer Neg Hx     ALLERGIES:  is allergic to aspirin, fluogen [influenza virus vaccine], iodine, and other.  MEDICATIONS:  Current Outpatient Medications  Medication Sig Dispense Refill   anastrozole (ARIMIDEX) 1 MG tablet Take 1 tablet (1 mg total) by mouth daily. 90 tablet 3   atenolol (TENORMIN) 50 MG tablet Take 1 tablet (50 mg total) by mouth daily. 90 tablet 3   Calcium Carbonate-Vitamin D (CALTRATE 600+D) 600-400 MG-UNIT per tablet Take 1 tablet by mouth daily.  Multiple Vitamin (MULTIVITAMIN WITH MINERALS) TABS tablet Take 1 tablet by mouth 4 (four) times a week.     Naphazoline-Pheniramine (OPCON-A OP) Place 1 drop into both eyes daily as needed (dry/irritated eyes).     No current facility-administered medications for this visit.    REVIEW OF SYSTEMS:   Constitutional: Denies fevers, chills or abnormal night sweats Eyes: Denies blurriness of vision, double vision or watery eyes Ears, nose, mouth, throat, and face: Denies mucositis or sore throat Respiratory: Denies cough, dyspnea or wheezes Cardiovascular: Denies palpitation, chest discomfort or lower extremity swelling Gastrointestinal:  Denies nausea, heartburn or change in bowel habits Skin: Denies abnormal skin rashes Lymphatics: Denies new lymphadenopathy or easy bruising Neurological:Denies numbness, tingling or new weaknesses Behavioral/Psych: Mood is stable, no new changes  Breast: Denies any palpable lumps or discharge All other systems were reviewed with the patient and are negative.  PHYSICAL EXAMINATION:  ECOG PERFORMANCE STATUS: 0 - Asymptomatic  Vitals:   02/03/22 1214  BP: (!) 167/85  Pulse: 61  Resp: 19  Temp: (!) 97.3 F (36.3 C)  SpO2: 100%    Filed Weights   02/03/22 1214   Weight: 156 lb 4 oz (70.9 kg)    GENERAL:alert, no distress and comfortable Left breast, no major areas of desquamation. No other palpable masses or regional adenopathy.   LABORATORY DATA:  I have reviewed the data as listed Lab Results  Component Value Date   WBC 2.9 (L) 11/04/2021   HGB 14.7 11/04/2021   HCT 42.6 11/04/2021   MCV 90.6 11/04/2021   PLT 200 11/04/2021   Lab Results  Component Value Date   NA 142 11/04/2021   K 3.9 11/04/2021   CL 107 11/04/2021   CO2 29 11/04/2021    RADIOGRAPHIC STUDIES: I have personally reviewed the radiological reports and agreed with the findings in the report.  ASSESSMENT AND PLAN:  Malignant neoplasm of upper-inner quadrant of left breast in female, estrogen receptor positive ([place]) This is a very pleasant 67 year old postmenopausal female patient with newly diagnosed left breast invasive ductal carcinoma ER/PR positive HER2 negative, Ki-67 of 10%, overall grade 2 who is here at [facility] [place] for additional recommendations.  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 Oncotype of 19, no benefit from chemotherapy. She is now s/p adjuvant radiation and anti estrogen therapy. We have once again discussed about options for antiestrogen therapy including tamoxifen versus aromatase inhibitors.  Her last bone density was back in 2017 and normal.  She is interested in proceeding with aromatase inhibitors.  Anastrozole dispensed to the pharmacy of her choice.  She will return to clinic in 3 months for survivorship clinic.  She was of course encouraged to contact us sooner with any new questions or concerns.  I once again discussed about adverse effects of aromatase inhibitors including postmenopausal symptoms, vaginal dryness, arthralgias, bone density loss etc.   Total time spent: 20 minutes including history, physical  exam, review of records, counseling and coordination of care All questions were answered. The patient knows to call the clinic with any problems, questions or concerns.    Benay Pike, MD 02/03/22

## 2022-02-03 NOTE — Assessment & Plan Note (Signed)
This is a very pleasant 67 year old postmenopausal female patient with newly diagnosed left breast invasive ductal carcinoma ER/PR positive HER2 negative, Ki-67 of 10%, overall grade 2 who is here at [facility] [place] for additional recommendations.  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 Oncotype of 19, no benefit from chemotherapy. She is now s/p adjuvant radiation and anti estrogen therapy. We have once again discussed about options for antiestrogen therapy including tamoxifen versus aromatase inhibitors.  Her last bone density was back in 2017 and normal.  She is interested in proceeding with aromatase inhibitors.  Anastrozole dispensed to the pharmacy of her choice.  She will return to clinic in 3 months for survivorship clinic.  She was of course encouraged to contact us sooner with any new questions or concerns.  I once again discussed about adverse effects of aromatase inhibitors including postmenopausal symptoms, vaginal dryness, arthralgias, bone density loss etc.

## 2022-02-04 ENCOUNTER — Ambulatory Visit: Payer: Self-pay | Admitting: Hematology and Oncology

## 2022-02-09 ENCOUNTER — Ambulatory Visit: Payer: Medicare HMO

## 2022-02-11 ENCOUNTER — Ambulatory Visit: Payer: Medicare HMO | Attending: Surgery

## 2022-02-11 DIAGNOSIS — R293 Abnormal posture: Secondary | ICD-10-CM | POA: Insufficient documentation

## 2022-02-11 DIAGNOSIS — Z170 Estrogen receptor positive status [ER+]: Secondary | ICD-10-CM | POA: Insufficient documentation

## 2022-02-11 DIAGNOSIS — M25612 Stiffness of left shoulder, not elsewhere classified: Secondary | ICD-10-CM | POA: Insufficient documentation

## 2022-02-11 DIAGNOSIS — Z483 Aftercare following surgery for neoplasm: Secondary | ICD-10-CM | POA: Insufficient documentation

## 2022-02-11 DIAGNOSIS — C50512 Malignant neoplasm of lower-outer quadrant of left female breast: Secondary | ICD-10-CM | POA: Insufficient documentation

## 2022-02-11 NOTE — Therapy (Signed)
OUTPATIENT PHYSICAL THERAPY SOZO SCREENING NOTE   Patient Name: Janice Gross MRN: [id number] DOB:10-Sep-1955, 67 y.o., female Today's Date: 02/11/2022  PCP: [facility] REFERRING PROVIDER: [name], MD    Past Medical History:  Diagnosis Date   Allergic rhinitis    Anemia    pt states she has never heard that she had this   Anxiety    Constipation    Family history of adverse reaction to anesthesia    niece is hard to put to sleep and "sensitive to anesthesia"   GERD (gastroesophageal reflux disease)    Gestational diabetes    Headache(784.0)    Hypertension    IBS (irritable bowel syndrome)    Mitral valve prolapse    Neck pain    Neuromuscular disorder ([place])    Pt reports takign FLU shot in the 90s which caused leg numbmess which has gone away, no tingling "from time to time"   Palpitations    Past Surgical History:  Procedure Laterality Date   BREAST LUMPECTOMY WITH RADIOACTIVE SEED AND SENTINEL LYMPH NODE BIOPSY Left 11/12/2021   Procedure: LEFT BREAST BRACKETED LUMPECTOMY WITH RADIOACTIVE SEED X2 AND SENTINEL LYMPH NODE BIOPSY;  Surgeon: [name], MD;  Location: [facility];  Service: General;  Laterality: Left;  28 MIN ROOM 2   c section x 1     COLONOSCOPY  2006   Dr. Isa Rankin    LAPAROSCOPY     surgery to remove ovarian cyst     Patient Active Problem List   Diagnosis Date Noted   Malignant neoplasm of upper-inner quadrant of left breast in female, estrogen receptor positive ([place]) 11/03/2021   Mitral valve prolapse 12/03/2015   Anxiety 05/08/2015   Essential hypertension 05/08/2015   Genetic testing 11/23/2013   Family history of breast cancer in first degree relative 08/09/2012   Vaginal atrophy 08/09/2012   Menopause syndrome 08/09/2012   Dyspepsia and other specified disorders of function of stomach 05/03/2012   History of neurological disorder 04/04/2012   Overweight(278.02) 07/05/2011   Atrophic vaginitis  07/05/2011   Family history of breast cancer in mother 07/05/2011   Recurrent yeast infection 07/02/2011   ANEMIA 12/25/2008   CONSTIPATION 12/25/2008   NECK PAIN 12/25/2008   HEADACHE 12/25/2008   PALPITATIONS 12/25/2008   ANXIETY 03/24/2007   ALLERGIC RHINITIS 03/24/2007   GERD 03/24/2007   IRRITABLE BOWEL SYNDROME 03/24/2007   GESTATIONAL DIABETES 03/24/2007   MITRAL VALVE PROLAPSE, HX OF 03/24/2007    REFERRING DIAG: left breast cancer at risk for lymphedema  THERAPY DIAG:  No diagnosis found.  PERTINENT HISTORY:   Patient was diagnosed on 10/10/2021 with left grade 2 invasive ductal carcinoma breast cancer. It measures 1.2 cm and is located in the upper inner quadrant. It is ER/PR positive and HER2 negative with a Ki67 of 10%. 11/12/21- L breast lumpectomy and SLNB (0/9)  PRECAUTIONS: left UE Lymphedema risk,   SUBJECTIVE:  Skin got burned with final round of radiation.. No noticeable swelling  PAIN:  Are you having pain? No  SOZO SCREENING: Patient was assessed today using the SOZO machine to determine the lymphedema index score. This was compared to her baseline score. It was determined that she is within the recommended range when compared to her baseline and no further action is needed at this time. She will continue SOZO screenings. These are done every 3 months for 2 years post operatively followed by every 6 months for 2 years, and then annually.  Claris Pong, PT 02/11/2022, 7:00 AM

## 2022-03-02 DIAGNOSIS — I10 Essential (primary) hypertension: Secondary | ICD-10-CM | POA: Diagnosis not present

## 2022-03-02 DIAGNOSIS — E7800 Pure hypercholesterolemia, unspecified: Secondary | ICD-10-CM | POA: Diagnosis not present

## 2022-03-15 ENCOUNTER — Ambulatory Visit
Admission: RE | Admit: 2022-03-15 | Discharge: 2022-03-15 | Disposition: A | Discharge status: Home / Self Care | Payer: Medicare Other | Source: Ambulatory Visit | Attending: Adult Health | Admitting: Adult Health

## 2022-03-15 DIAGNOSIS — Z170 Estrogen receptor positive status [ER+]: Secondary | ICD-10-CM | POA: Insufficient documentation

## 2022-03-15 DIAGNOSIS — C50212 Malignant neoplasm of upper-inner quadrant of left female breast: Secondary | ICD-10-CM | POA: Insufficient documentation

## 2022-03-15 NOTE — Progress Notes (Signed)
  Radiation Oncology         [phone]) [phone] ________________________________  Name: Janice Gross MRN: [id number]  Date of Service: 03/15/2022  DOB: September 01, 1955  Post Treatment Telephone Note  Diagnosis:  Stage IA, pT2N0M0, grade 2, ER/PR positive invasive ductal carcinoma of the left breast.   Intent: Curative  Radiation Treatment Dates: 01/05/2022 through 02/01/2022 Site Technique Total Dose (Gy) Dose per Fx (Gy) Completed Fx Beam Energies  Breast, Left: Breast_L 3D 42.56/42.56 2.66 16/16 10XFFF  Breast, Left: Breast_L_Bst 3D 8/8 2 4/4 6X, 10X   (as documented in provider EOT note)   The patient was not available for call today. Detailed voicemail left.  The patient has a scheduled follow up with Wilber Bihari for ongoing surveillance, and was encouraged to call if she develops concerns or questions regarding radiation.   Leandra Kern, LPN

## 2022-03-26 ENCOUNTER — Ambulatory Visit: Payer: Medicare HMO

## 2022-03-26 DIAGNOSIS — E7800 Pure hypercholesterolemia, unspecified: Secondary | ICD-10-CM | POA: Diagnosis not present

## 2022-04-26 ENCOUNTER — Ambulatory Visit: Payer: Medicare HMO

## 2022-04-29 ENCOUNTER — Inpatient Hospital Stay: Payer: Medicare Other | Attending: Hematology and Oncology | Admitting: Adult Health

## 2022-04-29 ENCOUNTER — Other Ambulatory Visit: Payer: Self-pay

## 2022-04-29 ENCOUNTER — Encounter: Payer: Self-pay | Admitting: Adult Health

## 2022-04-29 VITALS — BP 157/67 | HR 66 | Temp 98.1°F | Resp 16 | Ht 61.0 in | Wt 145.1 lb

## 2022-04-29 DIAGNOSIS — Z170 Estrogen receptor positive status [ER+]: Secondary | ICD-10-CM | POA: Insufficient documentation

## 2022-04-29 DIAGNOSIS — Z79811 Long term (current) use of aromatase inhibitors: Secondary | ICD-10-CM | POA: Diagnosis not present

## 2022-04-29 DIAGNOSIS — C50212 Malignant neoplasm of upper-inner quadrant of left female breast: Secondary | ICD-10-CM | POA: Insufficient documentation

## 2022-04-29 NOTE — Progress Notes (Signed)
SURVIVORSHIP VISIT:   BRIEF ONCOLOGIC HISTORY:  Oncology History  Malignant neoplasm of upper-inner quadrant of left breast in female, estrogen receptor positive  10/10/2021 Mammogram   Screening mammogram showed loosely grouped calcifications in the right breast at 3:00 posterior depth, indeterminate, additional views are recommended.  Asymmetry in the left breast posterior depth superior region seen on the mediolateral oblique view only is indeterminate.  Diagnostic mammogram showed a 1 cm irregular mass with calcifications in the left breast, ultrasound was recommended.  Ultrasound confirmed a 1.2 x 0.5 cm irregular mass suspicious for malignancy.  No significant abnormalities in the axilla   10/29/2021 Pathology Results   Pathology results from the left breast biopsy showed invasive ductal carcinoma overall grade 2, prognostic showed ER 95% positive strong staining PR 40% positive strong staining, Ki-67 of 10% tumor cells are negative for HER2 1+ by [facility]   11/03/2021 Initial Diagnosis   Malignant neoplasm of upper-inner quadrant of left breast in female, estrogen receptor positive (HCC)   11/04/2021 Cancer Staging   Staging form: Breast, AJCC 8th Edition - Clinical stage from 11/04/2021: Stage IA (cT1c, cN0, cM0, G2, ER+, PR+, HER2-) - Signed by Ronny Bacon, PA-C on 11/04/2021 Stage prefix: Initial diagnosis Method of lymph node assessment: Clinical Histologic grading system: 3 grade system   12/21/2021 Cancer Staging   Staging form: Breast, AJCC 8th Edition - Pathologic: Stage IA (pT2, pN0(sn), cM0, G2, ER+, PR+, HER2-, Oncotype DX score: 19) - Signed by Ronny Bacon, PA-C on 12/21/2021 Stage prefix: Initial diagnosis Method of lymph node assessment: Sentinel lymph node biopsy Multigene prognostic tests performed: Oncotype DX Recurrence score range: Greater than or equal to 11 Histologic grading system: 3 grade system   01/05/2022 - 02/01/2022 Radiation Therapy    Site Technique Total Dose (Gy) Dose per Fx (Gy) Completed Fx Beam Energies  Breast, Left: Breast_L 3D 42.56/42.56 2.66 16/16 10XFFF  Breast, Left: Breast_L_Bst 3D 8/8 2 4/4 6X, 10X     02/2022 -  Anti-estrogen oral therapy   Anastrozole     INTERVAL HISTORY:  [name] to review her survivorship care plan detailing her treatment course for breast cancer, as well as monitoring long-term side effects of that treatment, education regarding health maintenance, screening, and overall wellness and health promotion.     Overall, [name] reports feeling quite well.  She is taking Anastrozole daily and notes her legs feel tired. Otherwise she is tolerating it well.  She has difficulty sleeping, however that existed prior to her breast cancer diagnosis.  She also has some vaginal dryness and hair thinning.  These began in 01/2022 once she completed radiation.    REVIEW OF SYSTEMS:  Review of Systems  Constitutional:  Negative for appetite change, chills, fatigue, fever and unexpected weight change.  HENT:   Negative for hearing loss, lump/mass and trouble swallowing.   Eyes:  Negative for eye problems and icterus.  Respiratory:  Negative for chest tightness, cough and shortness of breath.   Cardiovascular:  Negative for chest pain, leg swelling and palpitations.  Gastrointestinal:  Negative for abdominal distention, abdominal pain, constipation, diarrhea, nausea and vomiting.  Endocrine: Negative for hot flashes.  Genitourinary:  Negative for difficulty urinating.   Musculoskeletal:  Negative for arthralgias.  Skin:  Negative for itching and rash.  Neurological:  Negative for dizziness, extremity weakness, headaches and numbness.  Hematological:  Negative for adenopathy. Does not bruise/bleed easily.  Psychiatric/Behavioral:  Negative for depression. The patient is not nervous/anxious.  Breast: Denies any new nodularity, masses, tenderness, nipple changes, or nipple discharge.      PAST  MEDICAL/SURGICAL HISTORY:  Past Medical History:  Diagnosis Date   Allergic rhinitis    Anemia    pt states she has never heard that she had this   Anxiety    Constipation    Family history of adverse reaction to anesthesia    niece is hard to put to sleep and "sensitive to anesthesia"   GERD (gastroesophageal reflux disease)    Gestational diabetes    Headache(784.0)    Hypertension    IBS (irritable bowel syndrome)    Mitral valve prolapse    Neck pain    Neuromuscular disorder (HCC)    Pt reports takign FLU shot in the 90s which caused leg numbmess which has gone away, no tingling "from time to time"   Palpitations    Past Surgical History:  Procedure Laterality Date   BREAST LUMPECTOMY WITH RADIOACTIVE SEED AND SENTINEL LYMPH NODE BIOPSY Left 11/12/2021   Procedure: LEFT BREAST BRACKETED LUMPECTOMY WITH RADIOACTIVE SEED X2 AND SENTINEL LYMPH NODE BIOPSY;  Surgeon: Abigail Miyamoto, MD;  Location: [facility];  Service: General;  Laterality: Left;  60 MIN ROOM 2   c section x 1     COLONOSCOPY  2006   Dr. Gloriajean Dell    LAPAROSCOPY     surgery to remove ovarian cyst       ALLERGIES:  Allergies  Allergen Reactions   Aspirin Other (See Comments)    Makes her lower back hurt   Fluogen [Influenza Virus Vaccine] Other (See Comments)    Caused leg numbness and tingling in the 90s after flu shot   Iodine Other (See Comments)    shaking   Other Nausea And Vomiting    "Strong pain medications" cause nausea and vomiting and constipation     CURRENT MEDICATIONS:  Outpatient Encounter Medications as of 04/29/2022  Medication Sig   anastrozole (ARIMIDEX) 1 MG tablet Take 1 tablet (1 mg total) by mouth daily.   atenolol (TENORMIN) 50 MG tablet Take 1 tablet (50 mg total) by mouth daily.   Calcium Carbonate-Vitamin D (CALTRATE 600+D) 600-400 MG-UNIT per tablet Take 1 tablet by mouth daily.   Multiple Vitamin (MULTIVITAMIN WITH MINERALS) TABS tablet Take 1 tablet by mouth 4 (four)  times a week.   Naphazoline-Pheniramine (OPCON-A OP) Place 1 drop into both eyes daily as needed (dry/irritated eyes).   No facility-administered encounter medications on file as of 04/29/2022.     ONCOLOGIC FAMILY HISTORY:  Family History  Problem Relation Age of Onset   Breast cancer Sister 31   Diabetes Sister    Breast cancer Sister 44   Diabetes Sister    Breast cancer Mother    Ovarian cancer Sister 41   Heart disease Father    Diabetes Brother    Lung cancer Brother    Breast cancer Other        dx in her 50s/dx in her 53s   Colon cancer Other 23   Lung cancer Other        dx in his 46s   Stomach cancer Neg Hx       SOCIAL HISTORY:  Social History   Socioeconomic History   Marital status: Divorced    Spouse name: Not on file   Number of children: 1   Years of education: Not on file   Highest education level: Not on file  Occupational History   Occupation:  CONTRACTOR    Employer: [employer]  Tobacco Use   Smoking status: Never   Smokeless tobacco: Never   Tobacco comments:    tried as a teenager  Vaping Use   Vaping Use: Never used  Substance and Sexual Activity   Alcohol use: No    Alcohol/week: 0.0 standard drinks of alcohol   Drug use: No   Sexual activity: Not Currently    Birth control/protection: None  Other Topics Concern   Not on file  Social History Narrative   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 - reports loves her job      Home Situation: lives alone      Spiritual Beliefs: Christian      Lifestyle:walking on a regular basis; diet is fair         Social Determinants of Corporate investment banker Strain: Low Risk  (11/04/2021)   Overall Financial Resource Strain (CARDIA)    Difficulty of Paying Living Expenses: Not very hard  Food Insecurity: No Food Insecurity (11/04/2021)   Hunger Vital Sign    Worried About Running Out of Food in the Last Year: Never true    Ran Out of  Food in the Last Year: Never true  Transportation Needs: No Transportation Needs (11/04/2021)   PRAPARE - Administrator, Civil Service (Medical): No    Lack of Transportation (Non-Medical): No  Physical Activity: Not on file  Stress: Not on file  Social Connections: Not on file  Intimate Partner Violence: Not on file     OBSERVATIONS/OBJECTIVE:  BP (!) 157/67 (BP Location: Right Arm, Patient Position: Sitting)   Pulse 66   Temp 98.1 F (36.7 C) (Temporal)   Resp 16   Ht 5\' 1"  (1.549 m)   Wt 145 lb 1.6 oz (65.8 kg)   SpO2 100%   BMI 27.42 kg/m  GENERAL: Patient is a well appearing female in no acute distress HEENT:  Sclerae anicteric.  Oropharynx clear and moist. No ulcerations or evidence of oropharyngeal candidiasis. Neck is supple.  NODES:  No cervical, supraclavicular, or axillary lymphadenopathy palpated.  BREAST EXAM: Left breast status postlumpectomy and ideation no sign of local recurrence right breast is benign. LUNGS:  Clear to auscultation bilaterally.  No wheezes or rhonchi. HEART:  Regular rate and rhythm. No murmur appreciated. ABDOMEN:  Soft, nontender.  Positive, normoactive bowel sounds. No organomegaly palpated. MSK:  No focal spinal tenderness to palpation. Full range of motion bilaterally in the upper extremities. EXTREMITIES:  No peripheral edema.   SKIN:  Clear with no obvious rashes or skin changes. No nail dyscrasia. NEURO:  Nonfocal. Well oriented.  Appropriate affect.  LABORATORY DATA:  None for this visit.  DIAGNOSTIC IMAGING:  None for this visit.      ASSESSMENT AND PLAN:  Ms.. Janice Gross is a pleasant 67 y.o. female with Stage 1A left breast invasive ductal carcinoma, ER+/PR+/HER2-, diagnosed in 10/2021, treated with lumpectomy, adjuvant radiation therapy, and anti-estrogen therapy with anastrozole beginning in February 2024.  She presents to the Survivorship Clinic for our initial meeting and routine follow-up post-completion of  treatment for breast cancer.    1. Stage 1A left breast cancer:  Ms. Sharma is continuing to recover from definitive treatment for breast cancer. She will follow-up with her medical oncologist, Dr. Al Pimple in 6 months with history and physical exam per surveillance protocol.  She will continue her anti-estrogen therapy with Anastrozole. Thus far,  she is tolerating the Anastrozole well, with minimal side effects. Her mammogram is due 10/2022; orders placed today. Today, a comprehensive survivorship care plan and treatment summary was reviewed with the patient today detailing her breast cancer diagnosis, treatment course, potential late/long-term effects of treatment, appropriate follow-up care with recommendations for the future, and patient education resources.  A copy of this summary, along with a letter will be sent to the patient's primary care provider via mail/fax/In Basket message after today's visit.    2. Bone health:  Given Ms. Lawn age/history of breast cancer and her current treatment regimen including anti-estrogen therapy with Anastrozole, she is at risk for bone demineralization.  She is scheduled for bone density testing in August 2024.  She was given education on specific activities to promote bone health.  3. Cancer screening:  Due to Ms. Preis's history and her age, she should receive screening for skin cancers, colon cancer, and gynecologic cancers.  The information and recommendations are listed on the patient's comprehensive care plan/treatment summary and were reviewed in detail with the patient.    4. Health maintenance and wellness promotion: Ms. Kupfer was encouraged to consume 5-7 servings of fruits and vegetables per day. We reviewed the "Nutrition Rainbow" handout. She was also encouraged to engage in moderate to vigorous exercise for 30 minutes per day most days of the week.  She was instructed to limit her alcohol consumption and continue to abstain from tobacco use.     5.  Support services/counseling: It is not uncommon for this period of the patient's cancer care trajectory to be one of many emotions and stressors.  She was given information regarding our available services and encouraged to contact me with any questions or for help enrolling in any of our support group/programs.    Follow up instructions:    -Return to cancer center in 6 months for f/u with Dr. Al Pimple  -Mammogram due in 10/2022 -DEXA in 08/2022 -She is welcome to return back to the Survivorship Clinic at any time; no additional follow-up needed at this time.  -Consider referral back to survivorship as a long-term survivor for continued surveillance  The patient was provided an opportunity to ask questions and all were answered. The patient agreed with the plan and demonstrated an understanding of the instructions.   Total encounter time:30 minutes*in face-to-face visit time, chart review, lab review, care coordination, order entry, and documentation of the encounter time.    Lillard Anes, NP 04/29/22 4:05 PM Medical Oncology and Hematology [facility] [address] [place], [place] [address] Tel. [phone]    Fax. [phone]  *Total Encounter Time as defined by the Centers for Medicare and Medicaid Services includes, in addition to the face-to-face time of a patient visit (documented in the note above) non-face-to-face time: obtaining and reviewing outside history, ordering and reviewing medications, tests or procedures, care coordination (communications with other health care professionals or caregivers) and documentation in the medical record.

## 2022-04-29 NOTE — Progress Notes (Signed)
Received paperwork from pt regarding cancer supplemental insurance. Advised pt completion could be 10+ business days. Handed off to Rona Ravens, Charity fundraiser.

## 2022-05-17 ENCOUNTER — Ambulatory Visit: Payer: Medicare Other | Attending: Surgery

## 2022-05-17 VITALS — Wt 145.1 lb

## 2022-05-17 DIAGNOSIS — Z483 Aftercare following surgery for neoplasm: Secondary | ICD-10-CM

## 2022-05-17 NOTE — Therapy (Signed)
OUTPATIENT PHYSICAL THERAPY SOZO SCREENING NOTE   Patient Name: Janice Gross MRN: [id number] DOB:[birth year], 67 y.o., female Today's Date: 05/17/2022  PCP: [facility] REFERRING PROVIDER: Abigail Miyamoto, MD   PT End of Session - 05/17/22 1704     Visit Number 4   # unchanged due to screen only   PT Start Time 1702    PT Stop Time 1706    PT Time Calculation (min) 4 min    Activity Tolerance Patient tolerated treatment well    Behavior During Therapy WFL for tasks assessed/performed             Past Medical History:  Diagnosis Date   Allergic rhinitis    Anemia    pt states she has never heard that she had this   Anxiety    Constipation    Family history of adverse reaction to anesthesia    niece is hard to put to sleep and "sensitive to anesthesia"   GERD (gastroesophageal reflux disease)    Gestational diabetes    Headache(784.0)    Hypertension    IBS (irritable bowel syndrome)    Mitral valve prolapse    Neck pain    Neuromuscular disorder (HCC)    Pt reports takign FLU shot in the 90s which caused leg numbmess which has gone away, no tingling "from time to time"   Palpitations    Past Surgical History:  Procedure Laterality Date   BREAST LUMPECTOMY WITH RADIOACTIVE SEED AND SENTINEL LYMPH NODE BIOPSY Left 11/12/2021   Procedure: LEFT BREAST BRACKETED LUMPECTOMY WITH RADIOACTIVE SEED X2 AND SENTINEL LYMPH NODE BIOPSY;  Surgeon: Abigail Miyamoto, MD;  Location: [facility];  Service: General;  Laterality: Left;  60 MIN ROOM 2   c section x 1     COLONOSCOPY  2006   Dr. Gloriajean Dell    LAPAROSCOPY     surgery to remove ovarian cyst     Patient Active Problem List   Diagnosis Date Noted   Malignant neoplasm of upper-inner quadrant of left breast in female, estrogen receptor positive (HCC) 11/03/2021   Mitral valve prolapse 12/03/2015   Anxiety 05/08/2015   Essential hypertension 05/08/2015   Genetic testing 11/23/2013   Family  history of breast cancer in first degree relative 08/09/2012   Vaginal atrophy 08/09/2012   Menopause syndrome 08/09/2012   Dyspepsia and other specified disorders of function of stomach 05/03/2012   History of neurological disorder 04/04/2012   Overweight(278.02) 07/05/2011   Atrophic vaginitis 07/05/2011   Family history of breast cancer in mother 07/05/2011   Recurrent yeast infection 07/02/2011   ANEMIA 12/25/2008   CONSTIPATION 12/25/2008   NECK PAIN 12/25/2008   HEADACHE 12/25/2008   PALPITATIONS 12/25/2008   ANXIETY 03/24/2007   ALLERGIC RHINITIS 03/24/2007   GERD 03/24/2007   IRRITABLE BOWEL SYNDROME 03/24/2007   GESTATIONAL DIABETES 03/24/2007   MITRAL VALVE PROLAPSE, HX OF 03/24/2007    REFERRING DIAG: left breast cancer at risk for lymphedema  THERAPY DIAG:  Aftercare following surgery for neoplasm  PERTINENT HISTORY:   Patient was diagnosed on 10/10/2021 with left grade 2 invasive ductal carcinoma breast cancer. It measures 1.2 cm and is located in the upper inner quadrant. It is ER/PR positive and HER2 negative with a Ki67 of 10%. 11/12/21- L breast lumpectomy and SLNB (0/9)  PRECAUTIONS: left UE Lymphedema risk,   SUBJECTIVE: Pt returns for her 3 month L-Dex screen.   PAIN:  Are you having pain? No  SOZO SCREENING:  Patient was assessed today using the SOZO machine to determine the lymphedema index score. This was compared to her baseline score. It was determined that she is within the recommended range when compared to her baseline and no further action is needed at this time. She will continue SOZO screenings. These are done every 3 months for 2 years post operatively followed by every 6 months for 2 years, and then annually.   L-DEX FLOWSHEETS - 05/17/22 1700       L-DEX LYMPHEDEMA SCREENING   Measurement Type Unilateral    L-DEX MEASUREMENT EXTREMITY Upper Extremity    POSITION  Standing    DOMINANT SIDE Right    At Risk Side Left    BASELINE SCORE  (UNILATERAL) 5    L-DEX SCORE (UNILATERAL) 7.3    VALUE CHANGE (UNILAT) 2.3              Hermenia Bers, PTA 05/17/2022, 5:06 PM

## 2022-05-25 ENCOUNTER — Telehealth: Payer: Self-pay

## 2022-05-25 NOTE — Telephone Encounter (Signed)
Pt called inquiring about paperwork that was completed regarding cancer supplemental insurance. Per 04/29/22 note, paperwork handed to Rona Ravens, Charity fundraiser. Per Lamonte Sakai, the paperwork was completed on 05/01 and was placed either at the nurse's desk or registration desk #1. Unable to located paperwork at nurse's desk. Paperwork not located at [employer] #1 or front desk. Will relay message to Rona Ravens, RN for follow up. Advised Pt that we will be in touch, Pt verbalized understanding.

## 2022-06-22 ENCOUNTER — Telehealth: Payer: Self-pay

## 2022-06-22 NOTE — Telephone Encounter (Signed)
Returned Pt's call and LVM regarding rising blood pressures. Pt reports BP of 152/88. Advised Pt to seek care with PCP as they manage her atenolol rx and might need to make changes. Gave call back number with any questions.

## 2022-06-22 NOTE — Telephone Encounter (Signed)
Returned Pt's call over concern that anastrozole is causing higher blood pressures. Advised Pt to seek care with PCP now as they will address her current hypertension and possibly make changes to her BP medication. Also advised to hold anastrozole for 2 weeks and to call back with blood pressures at that time. Pt verbalized understanding.

## 2022-07-02 DIAGNOSIS — K589 Irritable bowel syndrome without diarrhea: Secondary | ICD-10-CM | POA: Diagnosis not present

## 2022-07-02 DIAGNOSIS — I341 Nonrheumatic mitral (valve) prolapse: Secondary | ICD-10-CM | POA: Diagnosis not present

## 2022-07-02 DIAGNOSIS — E7800 Pure hypercholesterolemia, unspecified: Secondary | ICD-10-CM | POA: Diagnosis not present

## 2022-07-02 DIAGNOSIS — I10 Essential (primary) hypertension: Secondary | ICD-10-CM | POA: Diagnosis not present

## 2022-07-02 DIAGNOSIS — C50212 Malignant neoplasm of upper-inner quadrant of left female breast: Secondary | ICD-10-CM | POA: Diagnosis not present

## 2022-07-02 DIAGNOSIS — Z8669 Personal history of other diseases of the nervous system and sense organs: Secondary | ICD-10-CM | POA: Diagnosis not present

## 2022-07-13 ENCOUNTER — Telehealth: Payer: Self-pay | Admitting: *Deleted

## 2022-07-13 NOTE — Telephone Encounter (Signed)
Janice Gross contacted office to report that she held Anastrozole for past 2 weeks to check blood pressure. She spoke with her PCP who advised that if blood pressure remained elevated after holding Anastrozole for 2 weeks, they would prescribe medication if needed. After 2 weeks holding Anastrozole, her blood pressure decreased to pre-Anastrozole level. She is not taking Anastrozole at this time.  [name] asked that Dr. Al Pimple be informed so that she can determine next step. Message routed to Dr. Al Pimple and support staff

## 2022-07-14 ENCOUNTER — Telehealth: Payer: Self-pay | Admitting: Hematology and Oncology

## 2022-07-14 ENCOUNTER — Telehealth: Payer: Self-pay | Admitting: *Deleted

## 2022-07-14 DIAGNOSIS — I341 Nonrheumatic mitral (valve) prolapse: Secondary | ICD-10-CM | POA: Diagnosis not present

## 2022-07-14 NOTE — Telephone Encounter (Signed)
Left patient a vm regarding upcoming appointment  

## 2022-07-14 NOTE — Telephone Encounter (Signed)
This RN called pt per follow up of her communication regarding resolution of elevated BP post holding the anastrozole with MD advice for an appt - telephone or in person to discuss further recommendations.  Obtained identified VM - message left requesting a return call per above.

## 2022-07-20 ENCOUNTER — Inpatient Hospital Stay: Payer: Medicare Other | Attending: Hematology and Oncology | Admitting: Hematology and Oncology

## 2022-07-20 DIAGNOSIS — C50212 Malignant neoplasm of upper-inner quadrant of left female breast: Secondary | ICD-10-CM | POA: Diagnosis not present

## 2022-07-20 DIAGNOSIS — Z170 Estrogen receptor positive status [ER+]: Secondary | ICD-10-CM

## 2022-07-20 MED ORDER — EXEMESTANE 25 MG PO TABS: 25.0000 mg | ORAL_TABLET | Freq: Every day | ORAL | 1 refills | Status: DC

## 2022-07-20 NOTE — Progress Notes (Unsigned)
Cancer Center CONSULT NOTE  Patient Care Team: Pa, [facility] as PCP - General Abigail Miyamoto, MD as Consulting Physician (General Surgery) Rachel Moulds, MD as Consulting Physician (Hematology and Oncology) Dorothy Puffer, MD as Consulting Physician (Radiation Oncology)  CHIEF COMPLAINTS/PURPOSE OF CONSULTATION:  Breast cancer follow up  HISTORY OF PRESENTING ILLNESS:  Janice Gross 67 y.o. female is here because of recent diagnosis of left breast cancer  I reviewed her records extensively and collaborated the history with the patient.  SUMMARY OF ONCOLOGIC HISTORY: Oncology History  Malignant neoplasm of upper-inner quadrant of left breast in female, estrogen receptor positive (HCC)  10/10/2021 Mammogram   Screening mammogram showed loosely grouped calcifications in the right breast at 3:00 posterior depth, indeterminate, additional views are recommended.  Asymmetry in the left breast posterior depth superior region seen on the mediolateral oblique view only is indeterminate.  Diagnostic mammogram showed a 1 cm irregular mass with calcifications in the left breast, ultrasound was recommended.  Ultrasound confirmed a 1.2 x 0.5 cm irregular mass suspicious for malignancy.  No significant abnormalities in the axilla   10/29/2021 Pathology Results   Pathology results from the left breast biopsy showed invasive ductal carcinoma overall grade 2, prognostic showed ER 95% positive strong staining PR 40% positive strong staining, Ki-67 of 10% tumor cells are negative for HER2 1+ by [facility]   11/04/2021 Cancer Staging   Staging form: Breast, AJCC 8th Edition - Clinical stage from 11/04/2021: Stage IA (cT1c, cN0, cM0, G2, ER+, PR+, HER2-) - Signed by Ronny Bacon, PA-C on 11/04/2021 Stage prefix: Initial diagnosis Method of lymph node assessment: Clinical Histologic grading system: 3 grade system   11/12/2021 Surgery   Left breast lumpectomy: IDC, g2,  3.6cm, margins neg, 9 SLN negative, T2, N0   11/12/2021 Oncotype testing   19/6%   12/21/2021 Cancer Staging   Staging form: Breast, AJCC 8th Edition - Pathologic: Stage IA (pT2, pN0(sn), cM0, G2, ER+, PR+, HER2-, Oncotype DX score: 19) - Signed by Ronny Bacon, PA-C on 12/21/2021 Stage prefix: Initial diagnosis Method of lymph node assessment: Sentinel lymph node biopsy Multigene prognostic tests performed: Oncotype DX Recurrence score range: Greater than or equal to 11 Histologic grading system: 3 grade system   01/05/2022 - 02/01/2022 Radiation Therapy   Site Technique Total Dose (Gy) Dose per Fx (Gy) Completed Fx Beam Energies  Breast, Left: Breast_L 3D 42.56/42.56 2.66 16/16 10XFFF  Breast, Left: Breast_L_Bst 3D 8/8 2 4/4 6X, 10X     02/2022 -  Anti-estrogen oral therapy   Anastrozole    Interval history  She is here for telephone visit.  Since she started anastrozole, she apparently had some high blood pressure issues hence this was held and the high blood pressure issues have resolved.  She is now here to discuss other options for antiestrogen therapy.  She is motivated to try something different.  Rest of the pertinent 10 point ROS and stable  MEDICAL HISTORY:  Past Medical History:  Diagnosis Date   Allergic rhinitis    Anemia    pt states she has never heard that she had this   Anxiety    Constipation    Family history of adverse reaction to anesthesia    niece is hard to put to sleep and "sensitive to anesthesia"   GERD (gastroesophageal reflux disease)    Gestational diabetes    Headache(784.0)    Hypertension    IBS (irritable bowel syndrome)  Mitral valve prolapse    Neck pain    Neuromuscular disorder (HCC)    Pt reports takign FLU shot in the 90s which caused leg numbmess which has gone away, no tingling "from time to time"   Palpitations     SURGICAL HISTORY: Past Surgical History:  Procedure Laterality Date   BREAST LUMPECTOMY WITH  RADIOACTIVE SEED AND SENTINEL LYMPH NODE BIOPSY Left 11/12/2021   Procedure: LEFT BREAST BRACKETED LUMPECTOMY WITH RADIOACTIVE SEED X2 AND SENTINEL LYMPH NODE BIOPSY;  Surgeon: Abigail Miyamoto, MD;  Location: [facility] OR;  Service: General;  Laterality: Left;  60 MIN ROOM 2   c section x 1     COLONOSCOPY  2006   Dr. Gloriajean Dell    LAPAROSCOPY     surgery to remove ovarian cyst      SOCIAL HISTORY: Social History   Socioeconomic History   Marital status: Divorced    Spouse name: Not on file   Number of children: 1   Years of education: Not on file   Highest education level: Not on file  Occupational History   Occupation: Biochemist, clinical: [employer]  Tobacco Use   Smoking status: Never   Smokeless tobacco: Never   Tobacco comments:    tried as a teenager  Vaping Use   Vaping status: Never Used  Substance and Sexual Activity   Alcohol use: No    Alcohol/week: 0.0 standard drinks of alcohol   Drug use: No   Sexual activity: Not Currently    Birth control/protection: None  Other Topics Concern   Not on file  Social History Narrative   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 - reports loves her job      Home Situation: lives alone      Spiritual Beliefs: Christian      Lifestyle:walking on a regular basis; diet is fair         Social Determinants of Corporate investment banker Strain: Low Risk  (11/04/2021)   Overall Financial Resource Strain (CARDIA)    Difficulty of Paying Living Expenses: Not very hard  Food Insecurity: No Food Insecurity (11/04/2021)   Hunger Vital Sign    Worried About Running Out of Food in the Last Year: Never true    Ran Out of Food in the Last Year: Never true  Transportation Needs: No Transportation Needs (11/04/2021)   PRAPARE - Administrator, Civil Service (Medical): No    Lack of Transportation (Non-Medical): No  Physical Activity: Not on file  Stress: Not on file   Social Connections: Not on file  Intimate Partner Violence: Not on file    FAMILY HISTORY: Family History  Problem Relation Age of Onset   Breast cancer Sister 34   Diabetes Sister    Breast cancer Sister 63   Diabetes Sister    Breast cancer Mother    Ovarian cancer Sister 61   Heart disease Father    Diabetes Brother    Lung cancer Brother    Breast cancer Other        dx in her 50s/dx in her 2s   Colon cancer Other 23   Lung cancer Other        dx in his 28s   Stomach cancer Neg Hx     ALLERGIES:  is allergic to aspirin, fluogen [influenza virus vaccine], iodine, and other.  MEDICATIONS:  Current Outpatient Medications  Medication Sig  Dispense Refill   exemestane (AROMASIN) 25 MG tablet Take 1 tablet (25 mg total) by mouth daily after breakfast. 30 tablet 1   atenolol (TENORMIN) 50 MG tablet Take 1 tablet (50 mg total) by mouth daily. 90 tablet 3   Calcium Carbonate-Vitamin D (CALTRATE 600+D) 600-400 MG-UNIT per tablet Take 1 tablet by mouth daily.     Naphazoline-Pheniramine (OPCON-A OP) Place 1 drop into both eyes daily as needed (dry/irritated eyes).     No current facility-administered medications for this visit.    REVIEW OF SYSTEMS:   Constitutional: Denies fevers, chills or abnormal night sweats Eyes: Denies blurriness of vision, double vision or watery eyes Ears, nose, mouth, throat, and face: Denies mucositis or sore throat Respiratory: Denies cough, dyspnea or wheezes Cardiovascular: Denies palpitation, chest discomfort or lower extremity swelling Gastrointestinal:  Denies nausea, heartburn or change in bowel habits Skin: Denies abnormal skin rashes Lymphatics: Denies new lymphadenopathy or easy bruising Neurological:Denies numbness, tingling or new weaknesses Behavioral/Psych: Mood is stable, no new changes  Breast: Denies any palpable lumps or discharge All other systems were reviewed with the patient and are negative.  PHYSICAL  EXAMINATION:  ECOG PERFORMANCE STATUS: 0 - Asymptomatic  There were no vitals filed for this visit.   There were no vitals filed for this visit.   Physical exam deferred, telephone visit   LABORATORY DATA:  I have reviewed the data as listed Lab Results  Component Value Date   WBC 2.9 (L) 11/04/2021   HGB 14.7 11/04/2021   HCT 42.6 11/04/2021   MCV 90.6 11/04/2021   PLT 200 11/04/2021   Lab Results  Component Value Date   NA 142 11/04/2021   K 3.9 11/04/2021   CL 107 11/04/2021   CO2 29 11/04/2021    RADIOGRAPHIC STUDIES: I have personally reviewed the radiological reports and agreed with the findings in the report.  ASSESSMENT AND PLAN:  Malignant neoplasm of upper-inner quadrant of left breast in female, estrogen receptor positive (HCC) This is a very pleasant 67 year old postmenopausal female patient with newly diagnosed left breast invasive ductal carcinoma ER/PR positive HER2 negative, Ki-67 of 10%, overall grade 2 who is here at [facility] for additional recommendations.  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 Oncotype of 19, no benefit from chemotherapy. She is now s/p adjuvant radiation, started on anastrozole but has some issues with high blood pressure hence we have changed the medication to exemestane. We have discussed about alternate options including tamoxifen, exemestane, letrozole.  After much discussion we have agreed to try exemestane.  I have encouraged her to come back and see [place] in approximately a month to see how she is doing on exemestane.  She expressed understanding of the recommendations.   Total time spent: 12 minutes including history, physical exam, review of records, counseling and coordination of care  I connected with  Janice Gross on 07/21/22 by a telephone application and verified that I am speaking  with the correct person using two identifiers.   I discussed the limitations of evaluation and management by telemedicine. The patient expressed understanding and agreed to proceed.  All questions were answered. The patient knows to call the clinic with any problems, questions or concerns.    Rachel Moulds, MD 07/21/22

## 2022-07-21 ENCOUNTER — Telehealth: Payer: Self-pay | Admitting: Hematology and Oncology

## 2022-07-21 NOTE — Telephone Encounter (Signed)
Spoke with patient confirming upcoming appointment  

## 2022-07-21 NOTE — Assessment & Plan Note (Signed)
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 Oncotype of 19, no benefit from chemotherapy. She is now s/p adjuvant radiation, started on anastrozole but has some issues with high blood pressure hence we have changed the medication to exemestane. We have discussed about alternate options including tamoxifen, exemestane, letrozole.  After much discussion we have agreed to try exemestane.  I have encouraged her to come back and see [place] in approximately a month to see how she is doing on exemestane.  She expressed understanding of the recommendations.

## 2022-07-27 ENCOUNTER — Ambulatory Visit: Payer: Medicare Other | Admitting: Hematology and Oncology

## 2022-08-09 ENCOUNTER — Ambulatory Visit: Payer: Medicare Other | Attending: Surgery

## 2022-08-09 VITALS — Wt 144.1 lb

## 2022-08-09 DIAGNOSIS — Z483 Aftercare following surgery for neoplasm: Secondary | ICD-10-CM | POA: Insufficient documentation

## 2022-08-09 NOTE — Therapy (Signed)
OUTPATIENT PHYSICAL THERAPY SOZO SCREENING NOTE   Patient Name: Janice Gross MRN: [id number] DOB:1955/01/08, 67 y.o., female Today's Date: 08/09/2022  PCP: [facility] REFERRING PROVIDER: Abigail Miyamoto, MD   PT End of Session - 08/09/22 1706     Visit Number 4   # unchanged due to screen only   PT Start Time 1605    PT Stop Time 1619    PT Time Calculation (min) 14 min    Activity Tolerance Patient tolerated treatment well    Behavior During Therapy WFL for tasks assessed/performed             Past Medical History:  Diagnosis Date   Allergic rhinitis    Anemia    pt states she has never heard that she had this   Anxiety    Constipation    Family history of adverse reaction to anesthesia    niece is hard to put to sleep and "sensitive to anesthesia"   GERD (gastroesophageal reflux disease)    Gestational diabetes    Headache(784.0)    Hypertension    IBS (irritable bowel syndrome)    Mitral valve prolapse    Neck pain    Neuromuscular disorder (HCC)    Pt reports takign FLU shot in the 90s which caused leg numbmess which has gone away, no tingling "from time to time"   Palpitations    Past Surgical History:  Procedure Laterality Date   BREAST LUMPECTOMY WITH RADIOACTIVE SEED AND SENTINEL LYMPH NODE BIOPSY Left 11/12/2021   Procedure: LEFT BREAST BRACKETED LUMPECTOMY WITH RADIOACTIVE SEED X2 AND SENTINEL LYMPH NODE BIOPSY;  Surgeon: Abigail Miyamoto, MD;  Location: [facility] OR;  Service: General;  Laterality: Left;  60 MIN ROOM 2   c section x 1     COLONOSCOPY  2006   Dr. Gloriajean Dell    LAPAROSCOPY     surgery to remove ovarian cyst     Patient Active Problem List   Diagnosis Date Noted   Malignant neoplasm of upper-inner quadrant of left breast in female, estrogen receptor positive (HCC) 11/03/2021   Mitral valve prolapse 12/03/2015   Anxiety 05/08/2015   Essential hypertension 05/08/2015   Genetic testing 11/23/2013   Family  history of breast cancer in first degree relative 08/09/2012   Vaginal atrophy 08/09/2012   Menopause syndrome 08/09/2012   Dyspepsia and other specified disorders of function of stomach 05/03/2012   History of neurological disorder 04/04/2012   Overweight(278.02) 07/05/2011   Atrophic vaginitis 07/05/2011   Family history of breast cancer in mother 07/05/2011   Recurrent yeast infection 07/02/2011   ANEMIA 12/25/2008   CONSTIPATION 12/25/2008   NECK PAIN 12/25/2008   HEADACHE 12/25/2008   PALPITATIONS 12/25/2008   ANXIETY 03/24/2007   ALLERGIC RHINITIS 03/24/2007   GERD 03/24/2007   IRRITABLE BOWEL SYNDROME 03/24/2007   GESTATIONAL DIABETES 03/24/2007   MITRAL VALVE PROLAPSE, HX OF 03/24/2007    REFERRING DIAG: left breast cancer at risk for lymphedema  THERAPY DIAG: Aftercare following surgery for neoplasm  PERTINENT HISTORY: Patient was diagnosed on 10/10/2021 with left grade 2 invasive ductal carcinoma breast cancer. It measures 1.2 cm and is located in the upper inner quadrant. It is ER/PR positive and HER2 negative with a Ki67 of 10%. 11/12/21- L breast lumpectomy and SLNB (0/9)   PRECAUTIONS: left UE Lymphedema risk, None  SUBJECTIVE: Pt returns for her 3 month L-Dex screen.   PAIN:  Are you having pain? No   SOZO SCREENING:  Patient was assessed today using the SOZO machine to determine the lymphedema index score. This was compared to her baseline score. It was determined that she is NOT within the recommended range when compared to her baseline and so she was fitted for a compression garment while in the clinic today. It is recommended she return in 1 month to be reassessed. If she continues to measure outside the recommended range, physical therapy treatment will be recommended at that time and a referral requested.   L-DEX FLOWSHEETS - 08/09/22 1700       L-DEX LYMPHEDEMA SCREENING   Measurement Type Unilateral    L-DEX MEASUREMENT EXTREMITY Upper Extremity     POSITION  Standing    DOMINANT SIDE Right    At Risk Side Left    BASELINE SCORE (UNILATERAL) 5    L-DEX SCORE (UNILATERAL) 14    VALUE CHANGE (UNILAT) 9             P: Pt to return in 1 month for reassess due to high change from baseline.   Hermenia Bers, PTA 08/09/2022, 5:29 PM   PLEASE KEEP YOUR COMPRESSION GARMENT ON DURING THE DAY TO GET THE BEST SWELLING REDUCTION. HERE ARE SOME ADDITIONAL TIPS: Do not sleep in your garment. If you have pain or notice swelling in your hand or at the top of your shoulder, call your therapist. This may be a sign that you need a different garment. 3.  Take good care of your garment so it lasts longer: Follow washing instructions on your garment label or box. Wash periodically using a mild detergent in warm water.  Do not use fabric softener or bleach.  Place garment in a mesh lingerie bag and use the gentle cycle of the washing machine or hand wash. Tumble dry low or lay flat to dry. TAKE CARE OF YOUR SKIN Apply a low pH moisturizing lotion to your skin daily Avoid scratching your skin Treat skin irritations quickly  Know the 5 warning signs of infection: redness, pain, warmth to touch, fever and increased swelling.  Call your physician immediately if you notice any of these signs of a possible infection.   [facility] [phone]

## 2022-08-15 ENCOUNTER — Other Ambulatory Visit: Payer: Self-pay | Admitting: Hematology and Oncology

## 2022-08-15 DIAGNOSIS — C50212 Malignant neoplasm of upper-inner quadrant of left female breast: Secondary | ICD-10-CM

## 2022-08-15 DIAGNOSIS — Z170 Estrogen receptor positive status [ER+]: Secondary | ICD-10-CM

## 2022-08-18 ENCOUNTER — Ambulatory Visit
Admission: RE | Admit: 2022-08-18 | Discharge: 2022-08-18 | Disposition: A | Discharge status: Home / Self Care | Payer: Medicare Other | Source: Ambulatory Visit | Attending: Hematology and Oncology | Admitting: Hematology and Oncology

## 2022-08-18 DIAGNOSIS — C50212 Malignant neoplasm of upper-inner quadrant of left female breast: Secondary | ICD-10-CM

## 2022-08-19 ENCOUNTER — Telehealth: Payer: Self-pay | Admitting: Adult Health

## 2022-08-19 NOTE — Telephone Encounter (Signed)
-----   Message from Noreene Filbert sent at 08/19/2022  4:46 PM EDT ----- Please let patient know that her bone density testing is normal. ----- Message ----- From: Interface, Rad Results In Sent: 08/18/2022   4:11 PM EDT To: Rachel Moulds, MD

## 2022-08-19 NOTE — Telephone Encounter (Signed)
Per Lillard Anes DNP, called pt with message below and left on pt personal cell. Advised to call office if there were any other concerns.

## 2022-08-21 ENCOUNTER — Encounter (HOSPITAL_COMMUNITY): Payer: Self-pay | Admitting: *Deleted

## 2022-08-21 ENCOUNTER — Ambulatory Visit (HOSPITAL_COMMUNITY)
Admission: EM | Admit: 2022-08-21 | Discharge: 2022-08-21 | Disposition: A | Discharge status: Home / Self Care | Payer: Medicare Other

## 2022-08-21 DIAGNOSIS — J019 Acute sinusitis, unspecified: Secondary | ICD-10-CM

## 2022-08-21 DIAGNOSIS — B9689 Other specified bacterial agents as the cause of diseases classified elsewhere: Secondary | ICD-10-CM | POA: Diagnosis not present

## 2022-08-21 MED ORDER — AMOXICILLIN-POT CLAVULANATE 875-125 MG PO TABS: 1.0000 | ORAL_TABLET | Freq: Two times a day (BID) | ORAL | 0 refills | Status: DC

## 2022-08-21 NOTE — ED Provider Notes (Addendum)
[facility]    CSN: [id number] Arrival date & time: 08/21/22  1609      History   Chief Complaint Chief Complaint  Patient presents with   Sore Throat   Nasal Congestion    HPI Janice Gross is a 67 y.o. female.   Patient presents to clinic for nasal congestion, sinus pressure, postnasal drip and sore throat that has been present for over 10 days now.  Reports she has a nonproductive cough as well.  Denies any shortness of breath, chest pain or wheezing.  Has been having hot and cold chills and fatigue, no documented fevers.    Denies any recent known sick contacts.  She has been taking elderberry.  She is immunocompromised, hx of breast cancer.     The history is provided by the patient and medical records.  Sore Throat Pertinent negatives include no chest pain, no abdominal pain and no shortness of breath.    Past Medical History:  Diagnosis Date   Allergic rhinitis    Anemia    pt states she has never heard that she had this   Anxiety    Constipation    Family history of adverse reaction to anesthesia    niece is hard to put to sleep and "sensitive to anesthesia"   GERD (gastroesophageal reflux disease)    Gestational diabetes    Headache(784.0)    Hypertension    IBS (irritable bowel syndrome)    Mitral valve prolapse    Neck pain    Neuromuscular disorder (HCC)    Pt reports takign FLU shot in the 90s which caused leg numbmess which has gone away, no tingling "from time to time"   Palpitations     Patient Active Problem List   Diagnosis Date Noted   Malignant neoplasm of upper-inner quadrant of left breast in female, estrogen receptor positive (HCC) 11/03/2021   Mitral valve prolapse 12/03/2015   Anxiety 05/08/2015   Essential hypertension 05/08/2015   Genetic testing 11/23/2013   Family history of breast cancer in first degree relative 08/09/2012   Vaginal atrophy 08/09/2012   Menopause syndrome 08/09/2012   Dyspepsia and other  specified disorders of function of stomach 05/03/2012   History of neurological disorder 04/04/2012   Overweight(278.02) 07/05/2011   Atrophic vaginitis 07/05/2011   Family history of breast cancer in mother 07/05/2011   Recurrent yeast infection 07/02/2011   ANEMIA 12/25/2008   CONSTIPATION 12/25/2008   NECK PAIN 12/25/2008   HEADACHE 12/25/2008   PALPITATIONS 12/25/2008   ANXIETY 03/24/2007   ALLERGIC RHINITIS 03/24/2007   GERD 03/24/2007   IRRITABLE BOWEL SYNDROME 03/24/2007   GESTATIONAL DIABETES 03/24/2007   MITRAL VALVE PROLAPSE, HX OF 03/24/2007    Past Surgical History:  Procedure Laterality Date   BREAST LUMPECTOMY WITH RADIOACTIVE SEED AND SENTINEL LYMPH NODE BIOPSY Left 11/12/2021   Procedure: LEFT BREAST BRACKETED LUMPECTOMY WITH RADIOACTIVE SEED X2 AND SENTINEL LYMPH NODE BIOPSY;  Surgeon: Abigail Miyamoto, MD;  Location: [facility];  Service: General;  Laterality: Left;  60 MIN ROOM 2   c section x 1     COLONOSCOPY  2006   Dr. Gloriajean Dell    LAPAROSCOPY     surgery to remove ovarian cyst      OB History     Gravida  1   Para  1   Term      Preterm      AB      Living  1  SAB      IAB      Ectopic      Multiple      Live Births               Home Medications    Prior to Admission medications   Medication Sig Start Date End Date Taking? Authorizing Provider  amoxicillin-clavulanate (AUGMENTIN) 875-125 MG tablet Take 1 tablet by mouth every 12 (twelve) hours. 08/21/22  Yes Rinaldo Ratel, [place] N, FNP  atenolol (TENORMIN) 50 MG tablet Take 1 tablet (50 mg total) by mouth daily. 05/15/15  Yes Terressa Koyanagi, DO  Calcium Carbonate-Vitamin D (CALTRATE 600+D) 600-400 MG-UNIT per tablet Take 1 tablet by mouth daily.   Yes [provider]  exemestane (AROMASIN) 25 MG tablet TAKE 1 TABLET (25 MG TOTAL) BY MOUTH DAILY AFTER BREAKFAST. 08/16/22  Yes Rachel Moulds, MD  Misc Natural Products (ELDERBERRY IMMUNE COMPLEX) CHEW as directed Orally    Yes [provider]  rosuvastatin (CRESTOR) 10 MG tablet 1 tablet Orally Once a day 01/05/22  Yes [provider]  Naphazoline-Pheniramine (OPCON-A OP) Place 1 drop into both eyes daily as needed (dry/irritated eyes).    [provider]    Family History Family History  Problem Relation Age of Onset   Breast cancer Sister 51   Diabetes Sister    Breast cancer Sister 25   Diabetes Sister    Breast cancer Mother    Ovarian cancer Sister 5   Heart disease Father    Diabetes Brother    Lung cancer Brother    Breast cancer Other        dx in her 50s/dx in her 63s   Colon cancer Other 23   Lung cancer Other        dx in his 52s   Stomach cancer Neg Hx     Social History Social History   Tobacco Use   Smoking status: Never   Smokeless tobacco: Never   Tobacco comments:    tried as a teenager  Vaping Use   Vaping status: Never Used  Substance Use Topics   Alcohol use: No    Alcohol/week: 0.0 standard drinks of alcohol   Drug use: No     Allergies   Aspirin, Fluogen [influenza virus vaccine], Iodine, and Other   Review of Systems Review of Systems  Constitutional:  Positive for chills and fatigue.  HENT:  Positive for congestion, postnasal drip, sinus pressure and sinus pain.   Respiratory:  Positive for cough. Negative for shortness of breath and wheezing.   Cardiovascular:  Negative for chest pain.  Gastrointestinal:  Negative for abdominal pain, diarrhea, nausea and vomiting.     Physical Exam Triage Vital Signs ED Triage Vitals  Encounter Vitals Group     BP 08/21/22 1636 (!) 164/84     Systolic BP Percentile --      Diastolic BP Percentile --      Pulse Rate 08/21/22 1636 75     Resp 08/21/22 1636 18     Temp 08/21/22 1636 98.4 F (36.9 C)     Temp Source 08/21/22 1636 Oral     SpO2 08/21/22 1636 95 %     Weight --      Height --      Head Circumference --      Peak Flow --      Pain Score 08/21/22 1635 0     Pain Loc --  Pain Education --      Exclude from Growth Chart --    No data found.  Updated Vital Signs BP (!) 164/84 (BP Location: Right Arm)   Pulse 75   Temp 98.4 F (36.9 C) (Oral)   Resp 18   SpO2 95%   Visual Acuity Right Eye Distance:   Left Eye Distance:   Bilateral Distance:    Right Eye Near:   Left Eye Near:    Bilateral Near:     Physical Exam Vitals and nursing note reviewed.  Constitutional:      Appearance: Normal appearance. She is well-developed.  HENT:     Head: Normocephalic and atraumatic.     Right Ear: External ear normal.     Left Ear: External ear normal.     Nose: Congestion and rhinorrhea present.     Right Sinus: Maxillary sinus tenderness present.     Mouth/Throat:     Mouth: Mucous membranes are moist.     Pharynx: Uvula midline. Posterior oropharyngeal erythema present.     Tonsils: No tonsillar exudate or tonsillar abscesses. 0 on the right. 0 on the left.  Eyes:     Conjunctiva/sclera: Conjunctivae normal.  Cardiovascular:     Rate and Rhythm: Normal rate and regular rhythm.     Heart sounds: Normal heart sounds. No murmur heard. Pulmonary:     Effort: Pulmonary effort is normal. No respiratory distress.     Breath sounds: Normal breath sounds.  Lymphadenopathy:     Cervical: Cervical adenopathy present.  Skin:    General: Skin is warm and dry.  Neurological:     General: No focal deficit present.     Mental Status: She is alert and oriented to person, place, and time.  Psychiatric:        Mood and Affect: Mood normal.        Behavior: Behavior normal.      UC Treatments / Results  Labs (all labs ordered are listed, but only abnormal results are displayed) Labs Reviewed - No data to display  EKG   Radiology No results found.  Procedures Procedures (including critical care time)  Medications Ordered in UC Medications - No data to display  Initial Impression / Assessment and Plan / UC Course  I have reviewed the triage  vital signs and the nursing notes.  Pertinent labs & imaging results that were available during my care of the patient were reviewed by me and considered in my medical decision making (see chart for details).  Vitals and triage reviewed, patient is hemodynamically stable.  Having right-sided maxillary sinus tenderness to palpation with 10 days of sinus pain, pressure, postnasal drip and congestion.  Lungs are vesicular, heart w/ RRR.  Will treat with Augmentin for acute bacterial sinusitis.  Low concern for viral etiology due to duration and symptoms.  Symptomatic management with Mucinex and saline rinses discussed.  Plan of care, follow-up care and return precautions given, no questions at this time.     Final Clinical Impressions(s) / UC Diagnoses   Final diagnoses:  Acute bacterial sinusitis     Discharge Instructions      Please take the antibiotics twice a day with food until finished.  You can also do 1200 mg of Mucinex daily in addition to at least 64 ounces of water to help loosen up your secretions.  Using an over-the-counter sinus rinse like Netti Pot is another good idea, use distilled or filtered water.  Please return to  clinic if you have no improvement despite finishing antibiotics, or any new concerning symptoms.     ED Prescriptions     Medication Sig Dispense Auth. Provider   amoxicillin-clavulanate (AUGMENTIN) 875-125 MG tablet Take 1 tablet by mouth every 12 (twelve) hours. 14 tablet Willey Due, [place] N, [place]      PDMP not reviewed this encounter.      Evangaline Jou, [place] N, [place] 08/21/22 [phone]

## 2022-08-21 NOTE — ED Triage Notes (Signed)
Pt states she has had congestion and sore throat for about a week and a half. She has been taking elderberry

## 2022-08-21 NOTE — Discharge Instructions (Addendum)
Please take the antibiotics twice a day with food until finished.  You can also do 1200 mg of Mucinex daily in addition to at least 64 ounces of water to help loosen up your secretions.  Using an over-the-counter sinus rinse like Netti Pot is another good idea, use distilled or filtered water.  Please return to clinic if you have no improvement despite finishing antibiotics, or any new concerning symptoms.

## 2022-08-27 ENCOUNTER — Inpatient Hospital Stay: Payer: Medicare Other | Attending: Hematology and Oncology | Admitting: Hematology and Oncology

## 2022-08-27 ENCOUNTER — Other Ambulatory Visit: Payer: Self-pay

## 2022-08-27 VITALS — BP 158/80 | HR 81 | Temp 97.8°F | Resp 17 | Ht 61.0 in | Wt 145.8 lb

## 2022-08-27 DIAGNOSIS — Z170 Estrogen receptor positive status [ER+]: Secondary | ICD-10-CM | POA: Insufficient documentation

## 2022-08-27 DIAGNOSIS — Z79811 Long term (current) use of aromatase inhibitors: Secondary | ICD-10-CM | POA: Insufficient documentation

## 2022-08-27 DIAGNOSIS — C50212 Malignant neoplasm of upper-inner quadrant of left female breast: Secondary | ICD-10-CM | POA: Diagnosis present

## 2022-08-27 NOTE — Assessment & Plan Note (Addendum)
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 Oncotype of 19, no benefit from chemotherapy. She is now s/p adjuvant radiation, started on anastrozole but has some issues with high blood pressure hence we have changed the medication to exemestane. She is tolerating this reasonably well except for some mild stomach upset.  She also mentions heartburn but she wonders if this is related to radiation since this was present even before she started exemestane. No concerns on physical exam.  She will be due for a mammogram in October 2024, this has been ordered.  Baseline bone density normal I have encouraged her to continue exemestane for a few more months and report any worsening side effects to me.  She will otherwise return to clinic in approximately 6 months or sooner as needed

## 2022-08-27 NOTE — Progress Notes (Signed)
[facility] CONSULT NOTE  Patient Care Team: Pa, [facility] as PCP - General Abigail Miyamoto, MD as Consulting Physician (General Surgery) Rachel Moulds, MD as Consulting Physician (Hematology and Oncology) Dorothy Puffer, MD as Consulting Physician (Radiation Oncology)  CHIEF COMPLAINTS/PURPOSE OF CONSULTATION:  Breast cancer follow up  HISTORY OF PRESENTING ILLNESS:  Janice Gross 67 y.o. female is here because of recent diagnosis of left breast cancer  I reviewed her records extensively and collaborated the history with the patient.  SUMMARY OF ONCOLOGIC HISTORY: Oncology History  Malignant neoplasm of upper-inner quadrant of left breast in female, estrogen receptor positive (HCC)  10/10/2021 Mammogram   Screening mammogram showed loosely grouped calcifications in the right breast at 3:00 posterior depth, indeterminate, additional views are recommended.  Asymmetry in the left breast posterior depth superior region seen on the mediolateral oblique view only is indeterminate.  Diagnostic mammogram showed a 1 cm irregular mass with calcifications in the left breast, ultrasound was recommended.  Ultrasound confirmed a 1.2 x 0.5 cm irregular mass suspicious for malignancy.  No significant abnormalities in the axilla   10/29/2021 Pathology Results   Pathology results from the left breast biopsy showed invasive ductal carcinoma overall grade 2, prognostic showed ER 95% positive strong staining PR 40% positive strong staining, Ki-67 of 10% tumor cells are negative for HER2 1+ by [facility]   11/04/2021 Cancer Staging   Staging form: Breast, AJCC 8th Edition - Clinical stage from 11/04/2021: Stage IA (cT1c, cN0, cM0, G2, ER+, PR+, HER2-) - Signed by Ronny Bacon, PA-C on 11/04/2021 Stage prefix: Initial diagnosis Method of lymph node assessment: Clinical Histologic grading system: 3 grade system   11/12/2021 Surgery   Left breast lumpectomy: IDC, g2,  3.6cm, margins neg, 9 SLN negative, T2, N0   11/12/2021 Oncotype testing   19/6%   12/21/2021 Cancer Staging   Staging form: Breast, AJCC 8th Edition - Pathologic: Stage IA (pT2, pN0(sn), cM0, G2, ER+, PR+, HER2-, Oncotype DX score: 19) - Signed by Ronny Bacon, PA-C on 12/21/2021 Stage prefix: Initial diagnosis Method of lymph node assessment: Sentinel lymph node biopsy Multigene prognostic tests performed: Oncotype DX Recurrence score range: Greater than or equal to 11 Histologic grading system: 3 grade system   01/05/2022 - 02/01/2022 Radiation Therapy   Site Technique Total Dose (Gy) Dose per Fx (Gy) Completed Fx Beam Energies  Breast, Left: Breast_L 3D 42.56/42.56 2.66 16/16 10XFFF  Breast, Left: Breast_L_Bst 3D 8/8 2 4/4 6X, 10X     02/2022 -  Anti-estrogen oral therapy   Anastrozole    Interval history  She was initially started on anastrozole however had high blood pressure issues hence this was switched to exemestane. She says she is doing well on this, BP is better controlled although she has noted some stomach upset, heart burn since she exemestane although she tells heart burn was there prior to exemestane and she wonders if radiation caused it.  She notices an intermittent pain in the area of surgery.  No major breast changes.  She denies any other change in breathing, bowel habits or urinary habits.  Rest of the pertinent 10 point ROS and stable  MEDICAL HISTORY:  Past Medical History:  Diagnosis Date   Allergic rhinitis    Anemia    pt states she has never heard that she had this   Anxiety    Constipation    Family history of adverse reaction to anesthesia    niece is hard  to put to sleep and "sensitive to anesthesia"   GERD (gastroesophageal reflux disease)    Gestational diabetes    Headache(784.0)    Hypertension    IBS (irritable bowel syndrome)    Mitral valve prolapse    Neck pain    Neuromuscular disorder (HCC)    Pt reports takign FLU shot  in the 90s which caused leg numbmess which has gone away, no tingling "from time to time"   Palpitations     SURGICAL HISTORY: Past Surgical History:  Procedure Laterality Date   BREAST LUMPECTOMY WITH RADIOACTIVE SEED AND SENTINEL LYMPH NODE BIOPSY Left 11/12/2021   Procedure: LEFT BREAST BRACKETED LUMPECTOMY WITH RADIOACTIVE SEED X2 AND SENTINEL LYMPH NODE BIOPSY;  Surgeon: Abigail Miyamoto, MD;  Location: [facility];  Service: General;  Laterality: Left;  60 MIN ROOM 2   c section x 1     COLONOSCOPY  2006   Dr. Gloriajean Dell    LAPAROSCOPY     surgery to remove ovarian cyst      SOCIAL HISTORY: Social History   Socioeconomic History   Marital status: Divorced    Spouse name: Not on file   Number of children: 1   Years of education: Not on file   Highest education level: Not on file  Occupational History   Occupation: Biochemist, clinical: [employer]  Tobacco Use   Smoking status: Never   Smokeless tobacco: Never   Tobacco comments:    tried as a teenager  Vaping Use   Vaping status: Never Used  Substance and Sexual Activity   Alcohol use: No    Alcohol/week: 0.0 standard drinks of alcohol   Drug use: No   Sexual activity: Not Currently    Birth control/protection: None  Other Topics Concern   Not on file  Social History Narrative   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 - reports loves her job      Home Situation: lives alone      Spiritual Beliefs: Christian      Lifestyle:walking on a regular basis; diet is fair         Social Determinants of Corporate investment banker Strain: Low Risk  (11/04/2021)   Overall Financial Resource Strain (CARDIA)    Difficulty of Paying Living Expenses: Not very hard  Food Insecurity: No Food Insecurity (11/04/2021)   Hunger Vital Sign    Worried About Running Out of Food in the Last Year: Never true    Ran Out of Food in the Last Year: Never true  Transportation Needs:  No Transportation Needs (11/04/2021)   PRAPARE - Administrator, Civil Service (Medical): No    Lack of Transportation (Non-Medical): No  Physical Activity: Not on file  Stress: Not on file  Social Connections: Not on file  Intimate Partner Violence: Not on file    FAMILY HISTORY: Family History  Problem Relation Age of Onset   Breast cancer Sister 61   Diabetes Sister    Breast cancer Sister 58   Diabetes Sister    Breast cancer Mother    Ovarian cancer Sister 17   Heart disease Father    Diabetes Brother    Lung cancer Brother    Breast cancer Other        dx in her 50s/dx in her 39s   Colon cancer Other 23   Lung cancer Other  dx in his 68s   Stomach cancer Neg Hx     ALLERGIES:  is allergic to aspirin, fluogen [influenza virus vaccine], iodine, and other.  MEDICATIONS:  Current Outpatient Medications  Medication Sig Dispense Refill   amoxicillin-clavulanate (AUGMENTIN) 875-125 MG tablet Take 1 tablet by mouth every 12 (twelve) hours. 14 tablet 0   atenolol (TENORMIN) 50 MG tablet Take 1 tablet (50 mg total) by mouth daily. 90 tablet 3   Calcium Carbonate-Vitamin D (CALTRATE 600+D) 600-400 MG-UNIT per tablet Take 1 tablet by mouth daily.     exemestane (AROMASIN) 25 MG tablet TAKE 1 TABLET (25 MG TOTAL) BY MOUTH DAILY AFTER BREAKFAST. 90 tablet 1   Misc Natural Products (ELDERBERRY IMMUNE COMPLEX) CHEW as directed Orally     Naphazoline-Pheniramine (OPCON-A OP) Place 1 drop into both eyes daily as needed (dry/irritated eyes).     rosuvastatin (CRESTOR) 10 MG tablet 1 tablet Orally Once a day     No current facility-administered medications for this visit.    REVIEW OF SYSTEMS:   Constitutional: Denies fevers, chills or abnormal night sweats Eyes: Denies blurriness of vision, double vision or watery eyes Ears, nose, mouth, throat, and face: Denies mucositis or sore throat Respiratory: Denies cough, dyspnea or wheezes Cardiovascular: Denies  palpitation, chest discomfort or lower extremity swelling Gastrointestinal:  Denies nausea, heartburn or change in bowel habits Skin: Denies abnormal skin rashes Lymphatics: Denies new lymphadenopathy or easy bruising Neurological:Denies numbness, tingling or new weaknesses Behavioral/Psych: Mood is stable, no new changes  Breast: Denies any palpable lumps or discharge All other systems were reviewed with the patient and are negative.  PHYSICAL EXAMINATION:  ECOG PERFORMANCE STATUS: 0 - Asymptomatic  Vitals:   08/27/22 1303  BP: (!) 158/80  Pulse: 81  Resp: 17  Temp: 97.8 F (36.6 C)  SpO2: 99%     Filed Weights   08/27/22 1303  Weight: 145 lb 12.8 oz (66.1 kg)    General appearance: Alert, oriented and in no acute distress Neck: No cervical adenopathy Breast: Bilateral breast inspected and palpated.  No palpable masses or regional adenopathy  LABORATORY DATA:  I have reviewed the data as listed Lab Results  Component Value Date   WBC 2.9 (L) 11/04/2021   HGB 14.7 11/04/2021   HCT 42.6 11/04/2021   MCV 90.6 11/04/2021   PLT 200 11/04/2021   Lab Results  Component Value Date   NA 142 11/04/2021   K 3.9 11/04/2021   CL 107 11/04/2021   CO2 29 11/04/2021    RADIOGRAPHIC STUDIES: I have personally reviewed the radiological reports and agreed with the findings in the report.  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 Oncotype of 19, no benefit from chemotherapy. She is  now s/p adjuvant radiation, started on anastrozole but has some issues with high blood pressure hence we have changed the medication to exemestane. She is tolerating this reasonably well except for some mild stomach upset.  She also mentions heartburn but she wonders if this is related to radiation since this was present even before  she started exemestane. No concerns on physical exam.  She will be due for a mammogram in October 2024, this has been ordered.  Baseline bone density normal I have encouraged her to continue exemestane for a few more months and report any worsening side effects to me.  She will otherwise return to clinic in approximately 6 months or sooner as needed     All questions were answered. The patient knows to call the clinic with any problems, questions or concerns.    Rachel Moulds, MD 08/27/22

## 2022-09-13 ENCOUNTER — Ambulatory Visit: Payer: Medicare Other | Attending: Surgery

## 2022-09-13 VITALS — Wt 144.2 lb

## 2022-09-13 DIAGNOSIS — Z483 Aftercare following surgery for neoplasm: Secondary | ICD-10-CM | POA: Insufficient documentation

## 2022-09-13 NOTE — Therapy (Signed)
OUTPATIENT PHYSICAL THERAPY SOZO SCREENING NOTE   Patient Name: Janice Gross MRN: [id number] DOB:08-Mar-1955, 67 y.o., female Today's Date: 09/13/2022  PCP: [facility] REFERRING PROVIDER: Abigail Miyamoto, MD   PT End of Session - 09/13/22 1708     Visit Number 4   # unchanged due to screen only   PT Start Time 1705    PT Stop Time 1710    PT Time Calculation (min) 5 min    Activity Tolerance Patient tolerated treatment well    Behavior During Therapy WFL for tasks assessed/performed             Past Medical History:  Diagnosis Date   Allergic rhinitis    Anemia    pt states she has never heard that she had this   Anxiety    Constipation    Family history of adverse reaction to anesthesia    niece is hard to put to sleep and "sensitive to anesthesia"   GERD (gastroesophageal reflux disease)    Gestational diabetes    Headache(784.0)    Hypertension    IBS (irritable bowel syndrome)    Mitral valve prolapse    Neck pain    Neuromuscular disorder (HCC)    Pt reports takign FLU shot in the 90s which caused leg numbmess which has gone away, no tingling "from time to time"   Palpitations    Past Surgical History:  Procedure Laterality Date   BREAST LUMPECTOMY WITH RADIOACTIVE SEED AND SENTINEL LYMPH NODE BIOPSY Left 11/12/2021   Procedure: LEFT BREAST BRACKETED LUMPECTOMY WITH RADIOACTIVE SEED X2 AND SENTINEL LYMPH NODE BIOPSY;  Surgeon: Abigail Miyamoto, MD;  Location: [facility] OR;  Service: General;  Laterality: Left;  60 MIN ROOM 2   c section x 1     COLONOSCOPY  2006   Dr. Gloriajean Dell    LAPAROSCOPY     surgery to remove ovarian cyst     Patient Active Problem List   Diagnosis Date Noted   Malignant neoplasm of upper-inner quadrant of left breast in female, estrogen receptor positive (HCC) 11/03/2021   Mitral valve prolapse 12/03/2015   Anxiety 05/08/2015   Essential hypertension 05/08/2015   Genetic testing 11/23/2013   Family  history of breast cancer in first degree relative 08/09/2012   Vaginal atrophy 08/09/2012   Menopause syndrome 08/09/2012   Dyspepsia and other specified disorders of function of stomach 05/03/2012   History of neurological disorder 04/04/2012   Overweight(278.02) 07/05/2011   Atrophic vaginitis 07/05/2011   Family history of breast cancer in mother 07/05/2011   Recurrent yeast infection 07/02/2011   ANEMIA 12/25/2008   CONSTIPATION 12/25/2008   NECK PAIN 12/25/2008   HEADACHE 12/25/2008   PALPITATIONS 12/25/2008   ANXIETY 03/24/2007   ALLERGIC RHINITIS 03/24/2007   GERD 03/24/2007   IRRITABLE BOWEL SYNDROME 03/24/2007   GESTATIONAL DIABETES 03/24/2007   MITRAL VALVE PROLAPSE, HX OF 03/24/2007    REFERRING DIAG: left breast cancer at risk for lymphedema  THERAPY DIAG: Aftercare following surgery for neoplasm  PERTINENT HISTORY: Patient was diagnosed on 10/10/2021 with left grade 2 invasive ductal carcinoma breast cancer. It measures 1.2 cm and is located in the upper inner quadrant. It is ER/PR positive and HER2 negative with a Ki67 of 10%. 11/12/21- L breast lumpectomy and SLNB (0/9)   PRECAUTIONS: left UE Lymphedema risk, None  SUBJECTIVE: Pt returns for her 1 month assess after having a high change from baseline.   PAIN:  Are you having pain?  No.  SOZO SCREENING: Patient was assessed today using the SOZO machine to determine the lymphedema index score. This was compared to her baseline score. It was determined that she is within the recommended range when compared to her baseline and no further action is needed at this time. She will continue SOZO screenings. These are done every 3 months for 2 years post operatively followed by every 6 months for 2 years, and then annually.   L-DEX FLOWSHEETS - 09/13/22 1700       L-DEX LYMPHEDEMA SCREENING   Measurement Type Unilateral    L-DEX MEASUREMENT EXTREMITY Upper Extremity    POSITION  Standing    DOMINANT SIDE Right    At  Risk Side Left    BASELINE SCORE (UNILATERAL) 5    L-DEX SCORE (UNILATERAL) 7.1    VALUE CHANGE (UNILAT) 2.1              Hermenia Bers, PTA 09/13/2022, 5:11 PM

## 2022-09-24 ENCOUNTER — Telehealth: Payer: Self-pay

## 2022-09-24 NOTE — Telephone Encounter (Signed)
Pt called stating that she had some discomfort in one spot near her radiation site, on her L side, that started on Tuesday. She describes the pain and discomfort as "aching" and states that she can't get comfortable. She states that it feels like its going down her side and to the center of her back. Pt denies any warmth to the touch or other symptoms.   This RN expressed concern due to her site of soreness and aching continuing to spread and explains she would not suggest waiting until the weekend is over to see if she can be seen. This RN suggested pt go to urgent care to have the site checked out. This RN stated that she can call Monday just to let us know how she is feeling so we can follow up on her status. Pt verbalized understanding.

## 2022-09-24 NOTE — Telephone Encounter (Signed)
Pt LVM regarding some discomfort she is experiencing on her L side that is going to the center of her back and states that it is near her previous radiation tx site. This RN called back and LVM to gather more information on her symptoms. Call back number [phone] provided.

## 2022-09-26 ENCOUNTER — Ambulatory Visit
Admission: EM | Admit: 2022-09-26 | Discharge: 2022-09-26 | Disposition: A | Discharge status: Home / Self Care | Payer: Medicare Other | Attending: Internal Medicine | Admitting: Internal Medicine

## 2022-09-26 DIAGNOSIS — M546 Pain in thoracic spine: Secondary | ICD-10-CM | POA: Diagnosis not present

## 2022-09-26 LAB — POCT URINALYSIS DIP (MANUAL ENTRY)
Bilirubin, UA: NEGATIVE
Blood, UA: NEGATIVE
Glucose, UA: NEGATIVE mg/dL
Ketones, POC UA: NEGATIVE mg/dL
Leukocytes, UA: NEGATIVE
Nitrite, UA: NEGATIVE
Protein Ur, POC: NEGATIVE mg/dL
Spec Grav, UA: 1.015 (ref 1.010–1.025)
Urobilinogen, UA: 0.2 E.U./dL
pH, UA: 7 (ref 5.0–8.0)

## 2022-09-26 MED ORDER — METHOCARBAMOL 500 MG PO TABS: 500.0000 mg | ORAL_TABLET | Freq: Two times a day (BID) | ORAL | 0 refills | Status: DC | PRN

## 2022-09-26 NOTE — ED Triage Notes (Signed)
Patient presents with discomfort on left side of back x 1 week. Treated with OTC Tylenol and Advil with minimal relief. Patient states she had radiation treatment in January and unsure if it is related.

## 2022-09-26 NOTE — ED Provider Notes (Signed)
[facility]    CSN: [id number] Arrival date & time: 09/26/22  0908      History   Chief Complaint Chief Complaint  Patient presents with   Back Pain    HPI Chayse Dunman is a 67 y.o. female.   Patient presents with left-sided thoracic back pain that started about a week ago.  She denies any obvious injury to the area but reports that she has been doing some heavy lifting occasionally over the past few days.  Denies history of chronic back pain.  Pain does not radiate down legs.  She has taken Tylenol and Advil with minimal improvement in symptoms.  Patient is concerned today given that she had multiple radiation treatments to her left breast/thoracic area in January and wants to be sure it is not related.  Patient denies any coughing, shortness of breath, chest pain, pain with inspiration.  Denies any fever.  Denies urinary burning, urinary frequency, saddle anesthesia.   Back Pain   Past Medical History:  Diagnosis Date   Allergic rhinitis    Anemia    pt states she has never heard that she had this   Anxiety    Constipation    Family history of adverse reaction to anesthesia    niece is hard to put to sleep and "sensitive to anesthesia"   GERD (gastroesophageal reflux disease)    Gestational diabetes    Headache(784.0)    Hypertension    IBS (irritable bowel syndrome)    Mitral valve prolapse    Neck pain    Neuromuscular disorder (HCC)    Pt reports takign FLU shot in the 90s which caused leg numbmess which has gone away, no tingling "from time to time"   Palpitations     Patient Active Problem List   Diagnosis Date Noted   Malignant neoplasm of upper-inner quadrant of left breast in female, estrogen receptor positive (HCC) 11/03/2021   Mitral valve prolapse 12/03/2015   Anxiety 05/08/2015   Essential hypertension 05/08/2015   Genetic testing 11/23/2013   Family history of breast cancer in first degree relative 08/09/2012   Vaginal atrophy  08/09/2012   Menopause syndrome 08/09/2012   Dyspepsia and other specified disorders of function of stomach 05/03/2012   History of neurological disorder 04/04/2012   Overweight(278.02) 07/05/2011   Atrophic vaginitis 07/05/2011   Family history of breast cancer in mother 07/05/2011   Recurrent yeast infection 07/02/2011   ANEMIA 12/25/2008   CONSTIPATION 12/25/2008   NECK PAIN 12/25/2008   HEADACHE 12/25/2008   PALPITATIONS 12/25/2008   ANXIETY 03/24/2007   ALLERGIC RHINITIS 03/24/2007   GERD 03/24/2007   IRRITABLE BOWEL SYNDROME 03/24/2007   GESTATIONAL DIABETES 03/24/2007   MITRAL VALVE PROLAPSE, HX OF 03/24/2007    Past Surgical History:  Procedure Laterality Date   BREAST LUMPECTOMY WITH RADIOACTIVE SEED AND SENTINEL LYMPH NODE BIOPSY Left 11/12/2021   Procedure: LEFT BREAST BRACKETED LUMPECTOMY WITH RADIOACTIVE SEED X2 AND SENTINEL LYMPH NODE BIOPSY;  Surgeon: Abigail Miyamoto, MD;  Location: [facility] OR;  Service: General;  Laterality: Left;  60 MIN ROOM 2   c section x 1     COLONOSCOPY  2006   Dr. Gloriajean Dell    LAPAROSCOPY     surgery to remove ovarian cyst      OB History     Gravida  1   Para  1   Term      Preterm      AB  Living  1      SAB      IAB      Ectopic      Multiple      Live Births               Home Medications    Prior to Admission medications   Medication Sig Start Date End Date Taking? Authorizing Provider  methocarbamol (ROBAXIN) 500 MG tablet Take 1 tablet (500 mg total) by mouth 2 (two) times daily as needed for muscle spasms. 09/26/22  Yes Yvett Rossel, Rolly Salter E, FNP  amoxicillin-clavulanate (AUGMENTIN) 875-125 MG tablet Take 1 tablet by mouth every 12 (twelve) hours. 08/21/22   Garrison, [place] N, FNP  atenolol (TENORMIN) 50 MG tablet Take 1 tablet (50 mg total) by mouth daily. 05/15/15   Terressa Koyanagi, DO  Calcium Carbonate-Vitamin D (CALTRATE 600+D) 600-400 MG-UNIT per tablet Take 1 tablet by mouth daily.    [provider]  exemestane (AROMASIN) 25 MG tablet TAKE 1 TABLET (25 MG TOTAL) BY MOUTH DAILY AFTER BREAKFAST. 08/16/22   Rachel Moulds, MD  Misc Natural Products (ELDERBERRY IMMUNE COMPLEX) CHEW as directed Orally    [provider]  Naphazoline-Pheniramine (OPCON-A OP) Place 1 drop into both eyes daily as needed (dry/irritated eyes).    [provider]  rosuvastatin (CRESTOR) 10 MG tablet 1 tablet Orally Once a day 01/05/22   [provider]    Family History Family History  Problem Relation Age of Onset   Breast cancer Sister 59   Diabetes Sister    Breast cancer Sister 44   Diabetes Sister    Breast cancer Mother    Ovarian cancer Sister 49   Heart disease Father    Diabetes Brother    Lung cancer Brother    Breast cancer Other        dx in her 50s/dx in her 37s   Colon cancer Other 23   Lung cancer Other        dx in his 92s   Stomach cancer Neg Hx     Social History Social History   Tobacco Use   Smoking status: Never   Smokeless tobacco: Never   Tobacco comments:    tried as a teenager  Vaping Use   Vaping status: Never Used  Substance Use Topics   Alcohol use: No    Alcohol/week: 0.0 standard drinks of alcohol   Drug use: No     Allergies   Aspirin, Fluogen [influenza virus vaccine], Iodine, and Other   Review of Systems Review of Systems Per HPI  Physical Exam Triage Vital Signs ED Triage Vitals  Encounter Vitals Group     BP 09/26/22 0945 (!) 154/94     Systolic BP Percentile --      Diastolic BP Percentile --      Pulse Rate 09/26/22 0945 69     Resp --      Temp 09/26/22 0945 98.4 F (36.9 C)     Temp Source 09/26/22 0945 Oral     SpO2 09/26/22 0945 97 %     Weight 09/26/22 0941 145 lb (65.8 kg)     Height 09/26/22 0941 5' (1.524 m)     Head Circumference --      Peak Flow --      Pain Score 09/26/22 0941 9     Pain Loc --      Pain Education --      Exclude from Growth Chart --  No data  found.  Updated Vital Signs BP (!) 154/94 (BP Location: Right Arm)   Pulse 69   Temp 98.4 F (36.9 C) (Oral)   Ht 5' (1.524 m)   Wt 145 lb (65.8 kg)   SpO2 97%   BMI 28.32 kg/m   Visual Acuity Right Eye Distance:   Left Eye Distance:   Bilateral Distance:    Right Eye Near:   Left Eye Near:    Bilateral Near:     Physical Exam Constitutional:      General: She is not in acute distress.    Appearance: Normal appearance. She is not toxic-appearing or diaphoretic.  HENT:     Head: Normocephalic and atraumatic.  Eyes:     Extraocular Movements: Extraocular movements intact.     Conjunctiva/sclera: Conjunctivae normal.  Cardiovascular:     Rate and Rhythm: Normal rate and regular rhythm.     Pulses: Normal pulses.     Heart sounds: Normal heart sounds.  Pulmonary:     Effort: Pulmonary effort is normal. No respiratory distress.     Breath sounds: Normal breath sounds. No stridor. No wheezing, rhonchi or rales.  Musculoskeletal:       Back:     Comments: Patient reports tenderness to palpation that is mild to the left lateral thoracic area.  There is no discoloration or rashes noted.  No direct spinal tenderness, crepitus, step-off noted.  Patient has full range of motion of upper extremities and grip strength is 5/5.  No tenderness to neck or lumbar region.  Neurological:     General: No focal deficit present.     Mental Status: She is alert and oriented to person, place, and time. Mental status is at baseline.  Psychiatric:        Mood and Affect: Mood normal.        Behavior: Behavior normal.        Thought Content: Thought content normal.        Judgment: Judgment normal.      UC Treatments / Results  Labs (all labs ordered are listed, but only abnormal results are displayed) Labs Reviewed  POCT URINALYSIS DIP (MANUAL ENTRY)    EKG   Radiology No results found.  Procedures Procedures (including critical care time)  Medications Ordered in  UC Medications - No data to display  Initial Impression / Assessment and Plan / UC Course  I have reviewed the triage vital signs and the nursing notes.  Pertinent labs & imaging results that were available during my care of the patient were reviewed by me and considered in my medical decision making (see chart for details).     UA completed which is unremarkable.  There are no adventitious lung sounds on exam as well which is reassuring that no chest imaging is necessary.  I am suspicious of muscle strain given physical exam so will trial muscle relaxer to take as needed.  Advised patient muscle relaxer can make her drowsy and do not drive or drink alcohol with taking it.  I have a low suspicion that the symptoms are related to recent radiation treatment but encouraged her to follow-up with her oncologist to discuss symptoms to see if they have any further concerns.  Patient verbalized understanding and was agreeable with plan. Final Clinical Impressions(s) / UC Diagnoses   Final diagnoses:  Acute left-sided thoracic back pain     Discharge Instructions      I have prescribed you a muscle relaxer  to take as needed for suspicion of muscle strain.  Please be advised that it can make you drowsy.  Follow-up with your oncologist tomorrow as well.    ED Prescriptions     Medication Sig Dispense Auth. Provider   methocarbamol (ROBAXIN) 500 MG tablet Take 1 tablet (500 mg total) by mouth 2 (two) times daily as needed for muscle spasms. 20 tablet Newman, Acie Fredrickson, [place]      PDMP not reviewed this encounter.   Gustavus Bryant, [place] 09/26/22 1028

## 2022-09-26 NOTE — Discharge Instructions (Signed)
I have prescribed you a muscle relaxer to take as needed for suspicion of muscle strain.  Please be advised that it can make you drowsy.  Follow-up with your oncologist tomorrow as well.

## 2022-10-02 ENCOUNTER — Telehealth: Payer: Self-pay | Admitting: Hematology and Oncology

## 2022-10-11 ENCOUNTER — Ambulatory Visit (HOSPITAL_COMMUNITY)
Admission: RE | Admit: 2022-10-11 | Discharge: 2022-10-11 | Disposition: A | Discharge status: Home / Self Care | Payer: Medicare Other | Source: Ambulatory Visit | Attending: Adult Health | Admitting: Adult Health

## 2022-10-11 ENCOUNTER — Inpatient Hospital Stay: Payer: Medicare Other | Attending: Hematology and Oncology | Admitting: Adult Health

## 2022-10-11 ENCOUNTER — Encounter: Payer: Self-pay | Admitting: Adult Health

## 2022-10-11 VITALS — BP 170/83 | HR 62 | Temp 98.0°F | Resp 18 | Ht 60.0 in | Wt 147.2 lb

## 2022-10-11 DIAGNOSIS — I890 Lymphedema, not elsewhere classified: Secondary | ICD-10-CM | POA: Insufficient documentation

## 2022-10-11 DIAGNOSIS — R0789 Other chest pain: Secondary | ICD-10-CM | POA: Insufficient documentation

## 2022-10-11 DIAGNOSIS — C50212 Malignant neoplasm of upper-inner quadrant of left female breast: Secondary | ICD-10-CM | POA: Insufficient documentation

## 2022-10-11 DIAGNOSIS — Z79811 Long term (current) use of aromatase inhibitors: Secondary | ICD-10-CM | POA: Diagnosis not present

## 2022-10-11 DIAGNOSIS — Z170 Estrogen receptor positive status [ER+]: Secondary | ICD-10-CM | POA: Insufficient documentation

## 2022-10-11 MED ORDER — METHOCARBAMOL 500 MG PO TABS: 500.0000 mg | ORAL_TABLET | Freq: Two times a day (BID) | ORAL | 0 refills | Status: DC | PRN

## 2022-10-11 NOTE — Assessment & Plan Note (Signed)
Janice Gross is a 67 year old woman with history of stage Ia left breast invasive ductal carcinoma status postlumpectomy, adjuvant radiation, and antiestrogen therapy with aromatase inhibitors now taking exemestane daily.  Left breast stage Ia invasive ductal carcinoma: She will continue on exemestane daily.  I suggested she change the time of day that she takes this to see if it will ameliorate some of her sleep issues.  Her mammogram is ordered for next month. Left chest wall pain: I refilled the Robaxin that she can take twice daily as needed.  I let her know that she should need this less and less and if not to please let us know.  We will also obtain x-rays of the left chest wall ribs along with a chest x-ray today.  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 Sleep pattern disturbance: We discussed sleep hygiene in detail.  She will change the exemestane to take in the morning instead of in the evening to see if that helps as well. Hair loss: I okayed her to take biotin.  I also suggested she get a satin pillowcase and do things to avoid hair breakage. Hypertension: She will continue to check her blood pressure at home.  This seems to be isolated to blood pressures taken here at the cancer center due to anxiety she experiences when coming here. She requested some handouts on diet.  I gave her the [employer] food is medicine jumpstart guide. RTC in 02/2023 for follow-up with Dr. Al Pimple.

## 2022-10-11 NOTE — Progress Notes (Signed)
[facility] Cancer Follow up:    [place], [facility] [address], [address] [place] [address]   DIAGNOSIS:  Cancer Staging  Malignant neoplasm of upper-inner quadrant of left breast in female, estrogen receptor positive (HCC) Staging form: Breast, AJCC 8th Edition - Clinical stage from 11/04/2021: Stage IA (cT1c, cN0, cM0, G2, ER+, PR+, HER2-) - Signed by Ronny Bacon, PA-C on 11/04/2021 Stage prefix: Initial diagnosis Method of lymph node assessment: Clinical Histologic grading system: 3 grade system - Pathologic: Stage IA (pT2, pN0(sn), cM0, G2, ER+, PR+, HER2-, Oncotype DX score: 19) - Signed by Ronny Bacon, PA-C on 12/21/2021 Stage prefix: Initial diagnosis Method of lymph node assessment: Sentinel lymph node biopsy Multigene prognostic tests performed: Oncotype DX Recurrence score range: Greater than or equal to 11 Histologic grading system: 3 grade system   SUMMARY OF ONCOLOGIC HISTORY: Oncology History  Malignant neoplasm of upper-inner quadrant of left breast in female, estrogen receptor positive (HCC)  10/10/2021 Mammogram   Screening mammogram showed loosely grouped calcifications in the right breast at 3:00 posterior depth, indeterminate, additional views are recommended.  Asymmetry in the left breast posterior depth superior region seen on the mediolateral oblique view only is indeterminate.  Diagnostic mammogram showed a 1 cm irregular mass with calcifications in the left breast, ultrasound was recommended.  Ultrasound confirmed a 1.2 x 0.5 cm irregular mass suspicious for malignancy.  No significant abnormalities in the axilla   10/29/2021 Pathology Results   Pathology results from the left breast biopsy showed invasive ductal carcinoma overall grade 2, prognostic showed ER 95% positive strong staining PR 40% positive strong staining, Ki-67 of 10% tumor cells are negative for HER2 1+ by [facility]   11/04/2021 Cancer  Staging   Staging form: Breast, AJCC 8th Edition - Clinical stage from 11/04/2021: Stage IA (cT1c, cN0, cM0, G2, ER+, PR+, HER2-) - Signed by Ronny Bacon, PA-C on 11/04/2021 Stage prefix: Initial diagnosis Method of lymph node assessment: Clinical Histologic grading system: 3 grade system   11/12/2021 Surgery   Left breast lumpectomy: IDC, g2, 3.6cm, margins neg, 9 SLN negative, T2, N0   11/12/2021 Oncotype testing   19/6%   12/21/2021 Cancer Staging   Staging form: Breast, AJCC 8th Edition - Pathologic: Stage IA (pT2, pN0(sn), cM0, G2, ER+, PR+, HER2-, Oncotype DX score: 19) - Signed by Ronny Bacon, PA-C on 12/21/2021 Stage prefix: Initial diagnosis Method of lymph node assessment: Sentinel lymph node biopsy Multigene prognostic tests performed: Oncotype DX Recurrence score range: Greater than or equal to 11 Histologic grading system: 3 grade system   01/05/2022 - 02/01/2022 Radiation Therapy   Site Technique Total Dose (Gy) Dose per Fx (Gy) Completed Fx Beam Energies  Breast, Left: Breast_L 3D 42.56/42.56 2.66 16/16 10XFFF  Breast, Left: Breast_L_Bst 3D 8/8 2 4/4 6X, 10X     02/2022 -  Anti-estrogen oral therapy   Anastrozole     CURRENT THERAPY: Exemestane  INTERVAL HISTORY: [name] 67 y.o. female returns for follow-up after recent urgent care visit on September 26, 2022 due to left chest wall pain and discomfort not relieved by Tylenol or Advil.  Urgent care suspected muscle strain and prescribed Robaxin 500 mg p.o. twice daily as needed #20.  He continues to have the pain however she has been taking the Robaxin less and less.  She notes that it did help with the discomfort.  She notes that her left breast is swollen and tender.  She is very nervous about her upcoming mammogram due to the fact that it is more swollen.  Her blood pressure is elevated today at 170/83.  She says that it is only like this in this office and not in other healthcare  facilities.  She also checks her blood pressure at home and notes that it is less than 140/80.  Of note she is taking exemestane daily and since her last visit she underwent bone density testing on August 18, 2022 demonstrating normal bone density.  She endorses some mild arthralgias with her exemestane.  She also has experienced difficulty sleeping.  She takes the exemestane at night.   Patient Active Problem List   Diagnosis Date Noted   Malignant neoplasm of upper-inner quadrant of left breast in female, estrogen receptor positive (HCC) 11/03/2021   Mitral valve prolapse 12/03/2015   Anxiety 05/08/2015   Essential hypertension 05/08/2015   Genetic testing 11/23/2013   Family history of breast cancer in first degree relative 08/09/2012   Vaginal atrophy 08/09/2012   Menopause syndrome 08/09/2012   Dyspepsia and other specified disorders of function of stomach 05/03/2012   History of neurological disorder 04/04/2012   Overweight 07/05/2011   Atrophic vaginitis 07/05/2011   Family history of breast cancer in mother 07/05/2011   Recurrent yeast infection 07/02/2011   ANEMIA 12/25/2008   CONSTIPATION 12/25/2008   NECK PAIN 12/25/2008   HEADACHE 12/25/2008   PALPITATIONS 12/25/2008   ANXIETY 03/24/2007   ALLERGIC RHINITIS 03/24/2007   GERD 03/24/2007   IRRITABLE BOWEL SYNDROME 03/24/2007   GESTATIONAL DIABETES 03/24/2007   MITRAL VALVE PROLAPSE, HX OF 03/24/2007    is allergic to ace inhibitors, aspirin, doxycycline, fluogen [influenza virus vaccine], hydrochlorothiazide, iodine, and other.  MEDICAL HISTORY: Past Medical History:  Diagnosis Date   Allergic rhinitis    Anemia    pt states she has never heard that she had this   Anxiety    Constipation    Family history of adverse reaction to anesthesia    niece is hard to put to sleep and "sensitive to anesthesia"   GERD (gastroesophageal reflux disease)    Gestational diabetes    Headache(784.0)    Hypertension    IBS  (irritable bowel syndrome)    Mitral valve prolapse    Neck pain    Neuromuscular disorder (HCC)    Pt reports takign FLU shot in the 90s which caused leg numbmess which has gone away, no tingling "from time to time"   Palpitations     SURGICAL HISTORY: Past Surgical History:  Procedure Laterality Date   BREAST LUMPECTOMY WITH RADIOACTIVE SEED AND SENTINEL LYMPH NODE BIOPSY Left 11/12/2021   Procedure: LEFT BREAST BRACKETED LUMPECTOMY WITH RADIOACTIVE SEED X2 AND SENTINEL LYMPH NODE BIOPSY;  Surgeon: Abigail Miyamoto, MD;  Location: [facility] OR;  Service: General;  Laterality: Left;  60 MIN ROOM 2   c section x 1     COLONOSCOPY  2006   Dr. Gloriajean Dell    LAPAROSCOPY     surgery to remove ovarian cyst      SOCIAL HISTORY: Social History   Socioeconomic History   Marital status: Divorced    Spouse name: Not on file   Number of children: 1   Years of education: Not on file   Highest education level: Not on file  Occupational History   Occupation: Biochemist, clinical: [employer]  Tobacco Use   Smoking status: Never   Smokeless tobacco: Never   Tobacco comments:  tried as a teenager  Vaping Use   Vaping status: Never Used  Substance and Sexual Activity   Alcohol use: No    Alcohol/week: 0.0 standard drinks of alcohol   Drug use: No   Sexual activity: Not Currently    Birth control/protection: None  Other Topics Concern   Not on file  Social History Narrative   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 - reports loves her job      Home Situation: lives alone      Spiritual Beliefs: Christian      Lifestyle:walking on a regular basis; diet is fair         Social Determinants of Corporate investment banker Strain: Low Risk  (11/04/2021)   Overall Financial Resource Strain (CARDIA)    Difficulty of Paying Living Expenses: Not very hard  Food Insecurity: No Food Insecurity (11/04/2021)   Hunger Vital Sign     Worried About Running Out of Food in the Last Year: Never true    Ran Out of Food in the Last Year: Never true  Transportation Needs: No Transportation Needs (11/04/2021)   PRAPARE - Administrator, Civil Service (Medical): No    Lack of Transportation (Non-Medical): No  Physical Activity: Not on file  Stress: Not on file  Social Connections: Not on file  Intimate Partner Violence: Not on file    FAMILY HISTORY: Family History  Problem Relation Age of Onset   Breast cancer Sister 77   Diabetes Sister    Breast cancer Sister 12   Diabetes Sister    Breast cancer Mother    Ovarian cancer Sister 44   Heart disease Father    Diabetes Brother    Lung cancer Brother    Breast cancer Other        dx in her 50s/dx in her 32s   Colon cancer Other 23   Lung cancer Other        dx in his 47s   Stomach cancer Neg Hx     Review of Systems  Constitutional:  Positive for fatigue. Negative for appetite change, chills, fever and unexpected weight change.  HENT:   Negative for hearing loss, lump/mass, mouth sores, sore throat and trouble swallowing.   Eyes:  Negative for eye problems and icterus.  Respiratory:  Negative for chest tightness, cough, hemoptysis, shortness of breath and wheezing.   Cardiovascular:  Negative for chest pain, leg swelling and palpitations.  Gastrointestinal:  Negative for abdominal distention, abdominal pain, constipation, diarrhea, nausea and vomiting.  Endocrine: Negative for hot flashes.  Genitourinary:  Negative for difficulty urinating.   Musculoskeletal:  Positive for arthralgias.  Skin:  Negative for itching and rash.  Neurological:  Negative for dizziness, extremity weakness, headaches and numbness.  Hematological:  Negative for adenopathy. Does not bruise/bleed easily.  Psychiatric/Behavioral:  Negative for depression. The patient is not nervous/anxious.       PHYSICAL EXAMINATION   Onc Performance Status - 10/11/22 1037       ECOG  Perf Status   ECOG Perf Status Restricted in physically strenuous activity but ambulatory and able to carry out work of a light or sedentary nature, e.g., light house work, office work      KPS SCALE   KPS % SCORE Able to carry on normal activity, minor s/s of disease             Vitals:   10/11/22  1033  BP: (!) 170/83  Pulse: 62  Resp: 18  Temp: 98 F (36.7 C)  SpO2: 100%    Physical Exam Constitutional:      General: She is not in acute distress.    Appearance: Normal appearance. She is not toxic-appearing.  HENT:     Head: Normocephalic and atraumatic.     Mouth/Throat:     Mouth: Mucous membranes are moist.     Pharynx: Oropharynx is clear. No oropharyngeal exudate or posterior oropharyngeal erythema.  Eyes:     General: No scleral icterus. Cardiovascular:     Rate and Rhythm: Normal rate and regular rhythm.     Pulses: Normal pulses.     Heart sounds: Normal heart sounds.  Pulmonary:     Effort: Pulmonary effort is normal.     Breath sounds: Normal breath sounds.  Chest:     Comments: Left breast status postlumpectomy and radiation there is some swelling present consistent with breast lymphedema, no erythema or warmth suggesting cellulitis.  Right breast is benign, Abdominal:     General: Abdomen is flat. Bowel sounds are normal. There is no distension.     Palpations: Abdomen is soft.     Tenderness: There is no abdominal tenderness.  Musculoskeletal:        General: No swelling.     Cervical back: Neck supple.  Lymphadenopathy:     Cervical: No cervical adenopathy.     Upper Body:     Right upper body: No supraclavicular or axillary adenopathy.     Left upper body: No supraclavicular or axillary adenopathy.  Skin:    General: Skin is warm and dry.     Findings: No rash.  Neurological:     General: No focal deficit present.     Mental Status: She is alert.  Psychiatric:        Mood and Affect: Mood normal.        Behavior: Behavior normal.      LABORATORY DATA:  CBC    Component Value Date/Time   WBC 2.9 (L) 11/04/2021 0840   WBC 5.3 04/04/2012 1520   RBC 4.70 11/04/2021 0840   HGB 14.7 11/04/2021 0840   HCT 42.6 11/04/2021 0840   PLT 200 11/04/2021 0840   MCV 90.6 11/04/2021 0840   MCH 31.3 11/04/2021 0840   MCHC 34.5 11/04/2021 0840   RDW 12.1 11/04/2021 0840   LYMPHSABS 1.2 11/04/2021 0840   MONOABS 0.2 11/04/2021 0840   EOSABS 0.1 11/04/2021 0840   BASOSABS 0.0 11/04/2021 0840    CMP     Component Value Date/Time   NA 142 11/04/2021 0840   K 3.9 11/04/2021 0840   CL 107 11/04/2021 0840   CO2 29 11/04/2021 0840   GLUCOSE 127 (H) 11/04/2021 0840   BUN 15 11/04/2021 0840   CREATININE 0.79 11/04/2021 0840   CALCIUM 9.5 11/04/2021 0840   PROT 6.7 11/04/2021 0840   ALBUMIN 4.2 11/04/2021 0840   AST 20 11/04/2021 0840   ALT 12 11/04/2021 0840   ALKPHOS 49 11/04/2021 0840   BILITOT 0.8 11/04/2021 0840   GFRNONAA >60 11/04/2021 0840   GFRAA 113 03/01/2006 0854       ASSESSMENT and THERAPY PLAN:   Malignant neoplasm of upper-inner quadrant of left breast in female, estrogen receptor positive (HCC) Janice Gross is a 67 year old woman with history of stage Ia left breast invasive ductal carcinoma status postlumpectomy, adjuvant radiation, and antiestrogen therapy with aromatase inhibitors now taking exemestane daily.  Left breast stage Ia invasive ductal carcinoma: She will continue on exemestane daily.  I suggested she change the time of day that she takes this to see if it will ameliorate some of her sleep issues.  Her mammogram is ordered for next month. Left chest wall pain: I refilled the Robaxin that she can take twice daily as needed.  I let her know that she should need this less and less and if not to please let us know.  We will also obtain x-rays of the left chest wall ribs along with a chest x-ray today.  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 Sleep pattern  disturbance: We discussed sleep hygiene in detail.  She will change the exemestane to take in the morning instead of in the evening to see if that helps as well. Hair loss: I okayed her to take biotin.  I also suggested she get a satin pillowcase and do things to avoid hair breakage. Hypertension: She will continue to check her blood pressure at home.  This seems to be isolated to blood pressures taken here at the cancer center due to anxiety she experiences when coming here. She requested some handouts on diet.  I gave her the [facility] food is medicine jumpstart guide. RTC in 02/2023 for follow-up with Dr. Al Pimple.   All questions were answered. The patient knows to call the clinic with any problems, questions or concerns. We can certainly see the patient much sooner if necessary.  Total encounter time:45 minutes*in face-to-face visit time, chart review, lab review, care coordination, order entry, and documentation of the encounter time.    Lillard Anes, NP 10/11/22 11:41 AM Medical Oncology and Hematology [facility] [address] [place], [place] [address] Tel. [phone]    Fax. [phone]  *Total Encounter Time as defined by the Centers for Medicare and Medicaid Services includes, in addition to the face-to-face time of a patient visit (documented in the note above) non-face-to-face time: obtaining and reviewing outside history, ordering and reviewing medications, tests or procedures, care coordination (communications with other health care professionals or caregivers) and documentation in the medical record.

## 2022-10-14 ENCOUNTER — Telehealth: Payer: Self-pay | Admitting: *Deleted

## 2022-10-14 NOTE — Telephone Encounter (Signed)
Pt came in this past Monday with [facility]/DNP - pt has changed aromatase inhibitor now on exemestane due to bp elevations while on anastrozole.  Pt has been monitoring pressures at home this week and noted readings maintaining at 140/85 approximately.  This note will be forwarded to [facility]/DNP for review and further recommendation.

## 2022-10-16 NOTE — Telephone Encounter (Signed)
How is this in comparison to previous BPs?  Is she having any new symptoms?

## 2022-10-18 ENCOUNTER — Telehealth: Payer: Self-pay

## 2022-10-18 NOTE — Telephone Encounter (Signed)
Pt called for follow up regarding hypertension as it relates to her use of Exemestane. Pt reports she spoke with PCP and they are not willing to make any dose changes to her medication at this time without her resolving issues with AI therapy.  She is currently taking Olmesartan 20 mg daily as well as atenolol 50 mg daily.  Khalidah reports before starting AI therapy, her b/p with antihypertensives would run around 130/80, and now with the use of AI her B/Ps have been steady In the systolic 140's and diastolic 90's. She is asking for advice regarding this. Message forwarded to APP for further assistance.

## 2022-10-19 ENCOUNTER — Telehealth: Payer: Self-pay

## 2022-10-19 NOTE — Telephone Encounter (Signed)
Called pt per Np to see how comparison to previous BP. Pt states that her symptoms are the same. She says she was having chest pain and shaking from the treatment. Her last recording of her BP was 170/84.

## 2022-10-20 NOTE — Telephone Encounter (Signed)
Spoke with pt per MD message. She was advised to stop exemestane and keep a BP log for twice a day for 2 weeks. Advised pt to send [place] a MyChart message in 2 weeks with her BP readings to see if her BP has resolved since stopping AI.  She is in agreement with this plan and will MyChart message [place] in 2 weeks.

## 2022-10-23 NOTE — Therapy (Signed)
OUTPATIENT PHYSICAL THERAPY  UPPER EXTREMITY ONCOLOGY EVALUATION  Patient Name: Janice Gross MRN: [id number] DOB:[birth year], 67 y.o., female Today's Date: 10/25/2022  END OF SESSION:  PT End of Session - 10/25/22 1655     Visit Number 1    Number of Visits 12    Date for PT Re-Evaluation 12/06/22    PT Start Time 1600    PT Stop Time 1654    PT Time Calculation (min) 54 min    Activity Tolerance Patient tolerated treatment well    Behavior During Therapy WFL for tasks assessed/performed             Past Medical History:  Diagnosis Date   Allergic rhinitis    Anemia    pt states she has never heard that she had this   Anxiety    Constipation    Family history of adverse reaction to anesthesia    niece is hard to put to sleep and "sensitive to anesthesia"   GERD (gastroesophageal reflux disease)    Gestational diabetes    Headache(784.0)    Hypertension    IBS (irritable bowel syndrome)    Mitral valve prolapse    Neck pain    Neuromuscular disorder (HCC)    Pt reports takign FLU shot in the 90s which caused leg numbmess which has gone away, no tingling "from time to time"   Palpitations    Past Surgical History:  Procedure Laterality Date   BREAST LUMPECTOMY WITH RADIOACTIVE SEED AND SENTINEL LYMPH NODE BIOPSY Left 11/12/2021   Procedure: LEFT BREAST BRACKETED LUMPECTOMY WITH RADIOACTIVE SEED X2 AND SENTINEL LYMPH NODE BIOPSY;  Surgeon: Abigail Miyamoto, MD;  Location: [facility] OR;  Service: General;  Laterality: Left;  60 MIN ROOM 2   c section x 1     COLONOSCOPY  2006   Dr. Gloriajean Dell    LAPAROSCOPY     surgery to remove ovarian cyst     Patient Active Problem List   Diagnosis Date Noted   Malignant neoplasm of upper-inner quadrant of left breast in female, estrogen receptor positive (HCC) 11/03/2021   Mitral valve prolapse 12/03/2015   Anxiety 05/08/2015   Essential hypertension 05/08/2015   Genetic testing 11/23/2013   Family history of breast  cancer in first degree relative 08/09/2012   Vaginal atrophy 08/09/2012   Menopause syndrome 08/09/2012   Dyspepsia and other specified disorders of function of stomach 05/03/2012   History of neurological disorder 04/04/2012   Overweight 07/05/2011   Atrophic vaginitis 07/05/2011   Family history of breast cancer in mother 07/05/2011   Recurrent yeast infection 07/02/2011   ANEMIA 12/25/2008   CONSTIPATION 12/25/2008   NECK PAIN 12/25/2008   HEADACHE 12/25/2008   PALPITATIONS 12/25/2008   ANXIETY 03/24/2007   ALLERGIC RHINITIS 03/24/2007   GERD 03/24/2007   IRRITABLE BOWEL SYNDROME 03/24/2007   GESTATIONAL DIABETES 03/24/2007   MITRAL VALVE PROLAPSE, HX OF 03/24/2007      REFERRING PROVIDER: Lillard Anes, NP  REFERRING DIAG: Left Breast lymphedema  THERAPY DIAG:  Malignant neoplasm of upper-inner quadrant of left female breast, unspecified estrogen receptor status (HCC)  Lymphedema of breast  ONSET DATE: September  Rationale for Evaluation and Treatment: Rehabilitation  SUBJECTIVE:  SUBJECTIVE STATEMENT:  Pt with increased complaints of left breast swelling and tightness since September 2024.The swelling and tightness happened around the time she was taking the Cancer drugs.  She found an area under her left breast that she thinks may be a cord. She had  prior cording in her left UE. She has a compression bra  but feels like it is too loose so she wears a sports bra that she likes better.  PERTINENT HISTORY:  Patient was diagnosed on 10/10/2021 with left grade 2 invasive ductal carcinoma breast cancer. It measures 1.2 cm and is located in the upper inner quadrant. It is ER/PR positive and HER2 negative with a Ki67 of 10%. 11/12/21- L breast lumpectomy and SLNB (0/9) . She has been having  Sozo screens and had to wear a sleeve for 30 days. She is 0.1 below the yellow zone and she was  instructed to start wearing her sleeve again   PAIN:  Are you having pain? No, just discomfort  PRECAUTIONS: Left UE lymphedema risk  RED FLAGS: None   WEIGHT BEARING RESTRICTIONS: No  FALLS:  Has patient fallen in last 6 months? 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  HAND DOMINANCE: right   PRIOR LEVEL OF FUNCTION: Independent  PATIENT GOALS:  Decrease left breast swelling, check cording under breast   OBJECTIVE: Note: Objective measures were completed at Evaluation unless otherwise noted.  COGNITION: Overall cognitive status: Within functional limits for tasks assessed   PALPATION: Breast incision restricted with nodular area above incision, mildly tender  OBSERVATIONS / OTHER ASSESSMENTS: palpable cord under left breast running vertically, generalized left breast swelling greatest laterally, no significant enlarged pores but bra marks present  SENSATION: Light touch: Deficits     POSTURE: Forward head and rounded shoulders posture   UPPER EXTREMITY AROM/PROM:  A/PROM RIGHT   eval   Shoulder extension 64  Shoulder flexion 141  Shoulder abduction 180  Shoulder internal rotation   Shoulder external rotation     (Blank rows = not tested)  A/PROM LEFT   eval  Shoulder extension 70  Shoulder flexion 147  Shoulder abduction 165  Shoulder internal rotation   Shoulder external rotation     (Blank rows = not tested)  CERVICAL AROM: All within functional  limits:    UPPER EXTREMITY STRENGTH:   LYMPHEDEMA ASSESSMENTS:   SURGERY TYPE/DATE: Left Breast Lumpectomy with SLNB  NUMBER OF LYMPH NODES REMOVED: 0/9  CHEMOTHERAPY: NO  RADIATION:YES  HORMONE  TREATMENT: Exemestane  INFECTIONS: NO   LYMPHEDEMA ASSESSMENTS:   LANDMARK RIGHT  eval  At axilla  26.7  15 cm proximal to olecranon process   10 cm proximal to olecranon process 24.0  Olecranon process 22.6  15 cm proximal to ulnar styloid process 22.7  10 cm proximal to ulnar styloid process 20.45  Just proximal to ulnar styloid process 15.35  Across hand at thumb web space 19.1  At base of 2nd digit 6.5  (Blank rows = not tested)  LANDMARK LEFT  eval  At axilla  26.2  15 cm proximal to olecranon process   10 cm proximal to olecranon process 25.3  Olecranon process 23.55  15 cm proximal to ulnar styloid process 23.7  10 cm proximal to ulnar styloid process 21.0  Just proximal to  ulnar styloid process 15.9  Across hand at thumb web space 18.8  At base of 2nd digit 6.8  (Blank rows = not tested)   FUNCTIONAL TESTS:    GAIT:WNL  L-DEX LYMPHEDEMA SCREENING: The patient was assessed using the L-Dex machine today to produce a lymphedema index baseline score. The patient will be reassessed on a regular basis (typically every 3 months) to obtain new L-Dex scores. If the score is > 6.5 points away from his/her baseline score indicating onset of subclinical lymphedema, it will be recommended to wear a compression garment for 4 weeks, 12 hours per day and then be reassessed. If the score continues to be > 6.5 points from baseline at reassessment, we will initiate lymphedema treatment. Assessing in this manner has a 95% rate of preventing clinically significant lymphedema. 10/25/2022   6.4 above baseline making her on the borderline of green and yellow;Advised to start wearing her sleeve daily before we retest again  QUICK DASH SURVEY: NA  BREAST COMPLAINTS QUESTIONNAIRE Pain:2 Heaviness:7 Swollen feeling:8 Tense Skin:8 Redness:0 Bra Print:4 Size of Pores:3 Hard feeling: 6 Total:   38  /80 A Score over 9 indicates lymphedema issues in the breast    TODAY'S TREATMENT:                                                                                                                                           DATE:  Discussed POC, visit number, MLD to breast, MFR to cording    PATIENT EDUCATION:  Education details: POC, visit number, MLD to breast, MFR to cording, SOZO screen and start wearing sleeve again Person educated: Patient Education method: Explanation Education comprehension: verbalized understanding  HOME EXERCISE PROGRAM:   ASSESSMENT:  CLINICAL IMPRESSION: Patient is a 67 y.o. female who was seen today for physical therapy evaluation and treatment for complaints of left breast swelling and discomfort.. She also  presents with limitation in left shoulder abduction. Breast is with generalized swelling mostly laterally.  There is nodular area proximal to her incision that is firm and mildly tender.There is a vertical cord noted below the breast running vertically and a small cord present in the left axilla that does not really bother her. We repeated her SOZO screen and she is only 0.1 away from the yellow zone so she was instructed to start wearing her sleeve again. She feels that her compression bra is too large for her so she has been wearing a sports bra. She was aadvised to wear one so we can assess it.She will benefit from skilled PT to address deficits and return to PLOF.   OBJECTIVE IMPAIRMENTS: decreased knowledge of condition, decreased ROM, increased edema, increased fascial restrictions, and postural dysfunction.   ACTIVITY LIMITATIONS: reach over head  PARTICIPATION LIMITATIONS:  able to do all but with breast swelling  PERSONAL FACTORS: 1-2 comorbidities: left breast cancer s/p radiation  are  also affecting patient's functional outcome.   REHAB POTENTIAL: Good  CLINICAL DECISION MAKING: Stable/uncomplicated  EVALUATION COMPLEXITY: Low  GOALS: Goals reviewed with patient? Yes  SHORT TERM GOALS=LONG TERM GOALS: Target date:  12/06/2022  Pt will be compliant with compression bra/sports bra to decrease swelling Baseline: Goal status: INITIAL  2.  Pt will be independent with MLD to decrease left breast swelling Baseline:  Goal status: INITIAL  3.  Pt will improve left shoulder abduction to atleast 175 degrees  Baseline;165  Goal status: initial  4.  Pt will have decreased breast swelling by 50% Baseline:  Goal status: INITIAL  5.  Breast Complaints survey will decrease to 15 to demonstrate improvement in swelling Baseline:  Goal status: INITIAL  6.  Pt will be independent with HEP to improve Shoulder ROM Baseline:  Goal status: INITIAL  PLAN:  PT FREQUENCY: 2x/week  PT DURATION: 6 weeks  PLANNED INTERVENTIONS: 97164- PT Re-evaluation, 97110-Therapeutic exercises, 97535- Self Care, 96045- Manual therapy, 97760- Orthotic Fit/training, Patient/Family education, Balance training, Joint mobilization, Manual lymph drainage, Scar mobilization, Therapeutic exercises, Therapeutic activity, Neuromuscular re-education, Gait training, and Self Care  PLAN FOR NEXT SESSION: give wall slide for abduction, is she wearing her sleeve?, check sports bra/compression bra, initiate MLD to left breast, MFR to cording under left breast  Waynette Buttery, PT 10/25/2022, 4:58 PM

## 2022-10-25 ENCOUNTER — Other Ambulatory Visit: Payer: Self-pay

## 2022-10-25 ENCOUNTER — Ambulatory Visit: Payer: Medicare Other | Attending: Adult Health

## 2022-10-25 DIAGNOSIS — C50212 Malignant neoplasm of upper-inner quadrant of left female breast: Secondary | ICD-10-CM | POA: Diagnosis present

## 2022-10-25 DIAGNOSIS — C50512 Malignant neoplasm of lower-outer quadrant of left female breast: Secondary | ICD-10-CM | POA: Diagnosis present

## 2022-10-25 DIAGNOSIS — M25612 Stiffness of left shoulder, not elsewhere classified: Secondary | ICD-10-CM | POA: Insufficient documentation

## 2022-10-25 DIAGNOSIS — R293 Abnormal posture: Secondary | ICD-10-CM | POA: Diagnosis present

## 2022-10-25 DIAGNOSIS — I890 Lymphedema, not elsewhere classified: Secondary | ICD-10-CM | POA: Diagnosis present

## 2022-10-25 DIAGNOSIS — Z483 Aftercare following surgery for neoplasm: Secondary | ICD-10-CM | POA: Insufficient documentation

## 2022-10-25 DIAGNOSIS — Z170 Estrogen receptor positive status [ER+]: Secondary | ICD-10-CM | POA: Diagnosis present

## 2022-10-29 ENCOUNTER — Ambulatory Visit: Payer: Medicare Other | Admitting: Hematology and Oncology

## 2022-11-02 ENCOUNTER — Telehealth: Payer: Self-pay

## 2022-11-02 ENCOUNTER — Ambulatory Visit: Payer: Medicare Other | Admitting: Rehabilitation

## 2022-11-02 NOTE — Telephone Encounter (Signed)
-----   Message from Noreene Filbert sent at 11/02/2022  1:26 PM EDT ----- Regarding: FW: Please tell patient that her X-ray is negative ----- Message ----- From: Interface, Rad Results In Sent: 11/02/2022  11:51 AM EDT To: Loa Socks, NP

## 2022-11-02 NOTE — Telephone Encounter (Signed)
Called pt to make aware of results. She verbalized understanding and states she went to see her PCP todayt to discuss BP readings. Since stopping exemestane, pt's Bps have been fluctuating from normal to elevated. Pt reports her PCP is reluctant to change her antihypertensives until we know what we will do about AI therapy. Pt also states she is apprehensive about restarting exemestane d/t the way it made her feel; ie, joint pain, fatigue and reportedly feels her BP is also high d/t this med. She was offered appt with Mardella Layman, NP to further discuss and she accepted appt for 11/09/22 at 0945.

## 2022-11-03 ENCOUNTER — Encounter: Payer: Self-pay | Admitting: Hematology and Oncology

## 2022-11-04 ENCOUNTER — Encounter: Payer: Self-pay | Admitting: Rehabilitation

## 2022-11-04 ENCOUNTER — Ambulatory Visit: Payer: Medicare Other | Admitting: Rehabilitation

## 2022-11-04 DIAGNOSIS — I890 Lymphedema, not elsewhere classified: Secondary | ICD-10-CM

## 2022-11-04 DIAGNOSIS — C50512 Malignant neoplasm of lower-outer quadrant of left female breast: Secondary | ICD-10-CM

## 2022-11-04 DIAGNOSIS — Z170 Estrogen receptor positive status [ER+]: Secondary | ICD-10-CM

## 2022-11-04 DIAGNOSIS — C50212 Malignant neoplasm of upper-inner quadrant of left female breast: Secondary | ICD-10-CM | POA: Diagnosis not present

## 2022-11-04 DIAGNOSIS — M25612 Stiffness of left shoulder, not elsewhere classified: Secondary | ICD-10-CM

## 2022-11-04 DIAGNOSIS — R293 Abnormal posture: Secondary | ICD-10-CM

## 2022-11-04 DIAGNOSIS — Z483 Aftercare following surgery for neoplasm: Secondary | ICD-10-CM

## 2022-11-04 NOTE — Therapy (Signed)
OUTPATIENT PHYSICAL THERAPY  UPPER EXTREMITY ONCOLOGY TREATMENT  Patient Name: Janice Gross MRN: [id number] DOB:1955-02-28, 67 y.o., female Today's Date: 11/04/2022  END OF SESSION:  PT End of Session - 11/04/22 1456     Visit Number 2    Number of Visits 12    Date for PT Re-Evaluation 12/06/22    PT Start Time 1415   arrived late   PT Stop Time 1456    PT Time Calculation (min) 41 min    Activity Tolerance Patient tolerated treatment well    Behavior During Therapy WFL for tasks assessed/performed              Past Medical History:  Diagnosis Date   Allergic rhinitis    Anemia    pt states she has never heard that she had this   Anxiety    Constipation    Family history of adverse reaction to anesthesia    niece is hard to put to sleep and "sensitive to anesthesia"   GERD (gastroesophageal reflux disease)    Gestational diabetes    Headache(784.0)    Hypertension    IBS (irritable bowel syndrome)    Mitral valve prolapse    Neck pain    Neuromuscular disorder (HCC)    Pt reports takign FLU shot in the 90s which caused leg numbmess which has gone away, no tingling "from time to time"   Palpitations    Past Surgical History:  Procedure Laterality Date   BREAST LUMPECTOMY WITH RADIOACTIVE SEED AND SENTINEL LYMPH NODE BIOPSY Left 11/12/2021   Procedure: LEFT BREAST BRACKETED LUMPECTOMY WITH RADIOACTIVE SEED X2 AND SENTINEL LYMPH NODE BIOPSY;  Surgeon: Abigail Miyamoto, MD;  Location: [facility] OR;  Service: General;  Laterality: Left;  60 MIN ROOM 2   c section x 1     COLONOSCOPY  2006   Dr. Gloriajean Dell    LAPAROSCOPY     surgery to remove ovarian cyst     Patient Active Problem List   Diagnosis Date Noted   Malignant neoplasm of upper-inner quadrant of left breast in female, estrogen receptor positive (HCC) 11/03/2021   Mitral valve prolapse 12/03/2015   Anxiety 05/08/2015   Essential hypertension 05/08/2015   Genetic testing 11/23/2013   Family history  of breast cancer in first degree relative 08/09/2012   Vaginal atrophy 08/09/2012   Menopause syndrome 08/09/2012   Dyspepsia and other specified disorders of function of stomach 05/03/2012   History of neurological disorder 04/04/2012   Overweight 07/05/2011   Atrophic vaginitis 07/05/2011   Family history of breast cancer in mother 07/05/2011   Recurrent yeast infection 07/02/2011   ANEMIA 12/25/2008   CONSTIPATION 12/25/2008   NECK PAIN 12/25/2008   HEADACHE 12/25/2008   PALPITATIONS 12/25/2008   ANXIETY 03/24/2007   ALLERGIC RHINITIS 03/24/2007   GERD 03/24/2007   IRRITABLE BOWEL SYNDROME 03/24/2007   GESTATIONAL DIABETES 03/24/2007   MITRAL VALVE PROLAPSE, HX OF 03/24/2007      REFERRING PROVIDER: Lillard Anes, NP  REFERRING DIAG: Left Breast lymphedema  THERAPY DIAG:  Malignant neoplasm of upper-inner quadrant of left female breast, unspecified estrogen receptor status (HCC)  Lymphedema of breast  Stiffness of left shoulder, not elsewhere classified  Aftercare following surgery for neoplasm  Abnormal posture  Malignant neoplasm of lower-outer quadrant of left breast of female, estrogen receptor positive (HCC)  ONSET DATE: September  Rationale for Evaluation and Treatment: Rehabilitation  SUBJECTIVE:  SUBJECTIVE STATEMENT:  It is a little tender from the mammogram.    EVAL: Pt with increased complaints of left breast swelling and tightness since September 2024.The swelling and tightness happened around the time she was taking the Cancer drugs.  She found an area under her left breast that she thinks may be a cord. She had  prior cording in her left UE. She has a compression bra  but feels like it is too loose so she wears a sports bra that she likes better.  PERTINENT HISTORY:   Patient was diagnosed on 10/10/2021 with left grade 2 invasive ductal carcinoma breast cancer. It measures 1.2 cm and is located in the upper inner quadrant. It is ER/PR positive and HER2 negative with a Ki67 of 10%. 11/12/21- L breast lumpectomy and SLNB (0/9) . She has been having Sozo screens and had to wear a sleeve for 30 days. She is 0.1 below the yellow zone and she was  instructed to start wearing her sleeve again   PAIN:  Are you having pain? No, just discomfort  PRECAUTIONS: Left UE lymphedema risk  RED FLAGS: None   WEIGHT BEARING RESTRICTIONS: No  FALLS:  Has patient fallen in last 6 months? 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  HAND DOMINANCE: right   PRIOR LEVEL OF FUNCTION: Independent  PATIENT GOALS:  Decrease left breast swelling, check cording under breast   OBJECTIVE: Note: Objective measures were completed at Evaluation unless otherwise noted.  COGNITION: Overall cognitive status: Within functional limits for tasks assessed   PALPATION: Breast incision restricted with nodular area above incision, mildly tender  OBSERVATIONS / OTHER ASSESSMENTS: palpable cord under left breast running vertically, generalized left breast swelling greatest laterally, no significant enlarged pores but bra marks present  SENSATION: Light touch: Deficits     POSTURE: Forward head and rounded shoulders posture   UPPER EXTREMITY AROM/PROM:  A/PROM RIGHT   eval   Shoulder extension 64  Shoulder flexion 141  Shoulder abduction 180  Shoulder internal rotation   Shoulder external rotation     (Blank rows = not tested)  A/PROM LEFT   eval  Shoulder extension 70  Shoulder flexion 147  Shoulder abduction 165  Shoulder internal rotation   Shoulder external rotation      (Blank rows = not tested)  CERVICAL AROM: All within functional  limits:    UPPER EXTREMITY STRENGTH:   LYMPHEDEMA ASSESSMENTS:   SURGERY TYPE/DATE: Left Breast Lumpectomy with SLNB  NUMBER OF LYMPH NODES REMOVED: 0/9  CHEMOTHERAPY: NO  RADIATION:YES  HORMONE TREATMENT: Exemestane  INFECTIONS: NO   LYMPHEDEMA ASSESSMENTS:   LANDMARK RIGHT  eval  At axilla  26.7  15 cm proximal to olecranon process   10 cm proximal to olecranon process 24.0  Olecranon process 22.6  15 cm proximal to ulnar styloid process 22.7  10 cm proximal to ulnar styloid process 20.45  Just proximal to ulnar styloid process 15.35  Across hand at thumb web space 19.1  At base of 2nd digit 6.5  (Blank rows = not tested)  LANDMARK LEFT  eval  At axilla  26.2  15 cm proximal to olecranon process   10 cm proximal to olecranon process 25.3  Olecranon process 23.55  15 cm proximal to ulnar styloid process 23.7  10 cm  proximal to ulnar styloid process 21.0  Just proximal to ulnar styloid process 15.9  Across hand at thumb web space 18.8  At base of 2nd digit 6.8  (Blank rows = not tested)   FUNCTIONAL TESTS:    GAIT:WNL  L-DEX LYMPHEDEMA SCREENING: The patient was assessed using the L-Dex machine today to produce a lymphedema index baseline score. The patient will be reassessed on a regular basis (typically every 3 months) to obtain new L-Dex scores. If the score is > 6.5 points away from his/her baseline score indicating onset of subclinical lymphedema, it will be recommended to wear a compression garment for 4 weeks, 12 hours per day and then be reassessed. If the score continues to be > 6.5 points from baseline at reassessment, we will initiate lymphedema treatment. Assessing in this manner has a 95% rate of preventing clinically significant lymphedema. 10/25/2022   6.4 above baseline making her on the borderline of green and yellow;Advised to start wearing her sleeve daily before we  retest again  QUICK DASH SURVEY: NA  BREAST COMPLAINTS QUESTIONNAIRE Pain:2 Heaviness:7 Swollen feeling:8 Tense Skin:8 Redness:0 Bra Print:4 Size of Pores:3 Hard feeling: 6 Total:   38  /80 A Score over 9 indicates lymphedema issues in the breast    TODAY'S TREATMENT:                                                                                                                                         Pt permission and consent throughout each step of examination and treatment with modification and draping if requested when working on sensitive areas  DATE:  11/04/22 With pt permission for breast MLD Education on lymphatic anatomy and drainage patterns of the breast and principles of MLD Performed each step in supine per instruction section below with PT reading and then performing each step and then with hand over hand cueing and performance by pt with modification as needed - noted below.  MFR and STM with cocoa butter to the Lt axilla with arm propped overhead.   Discussed POC, visit number, MLD to breast, MFR to cording    PATIENT EDUCATION:  Education details: POC, visit number, MLD to breast, MFR to cording, SOZO screen and start wearing sleeve again Person educated: Patient Education method: Explanation Education comprehension: verbalized understanding  HOME EXERCISE PROGRAM:   ASSESSMENT:  CLINICAL IMPRESSION: Started self MLD education and started cording release work in the axilla.  With talking to pt during MT it was noted that she had gone back to the gym and done pushup and planks before the cording started.  Educated her to avoid these and to make sure she uses her compression for exercies.   OBJECTIVE IMPAIRMENTS: decreased knowledge of condition, decreased ROM, increased edema, increased fascial restrictions, and postural dysfunction.   ACTIVITY LIMITATIONS: reach over head  PARTICIPATION LIMITATIONS:  able to do all but with breast  swelling  PERSONAL FACTORS: 1-2 comorbidities: left breast cancer s/p radiation  are also affecting patient's functional outcome.   REHAB POTENTIAL: Good  CLINICAL DECISION MAKING: Stable/uncomplicated  EVALUATION COMPLEXITY: Low  GOALS: Goals reviewed with patient? Yes  SHORT TERM GOALS=LONG TERM GOALS: Target date: 12/06/2022  Pt will be compliant with compression bra/sports bra to decrease swelling Baseline: Goal status: INITIAL  2.  Pt will be independent with MLD to decrease left breast swelling Baseline:  Goal status: INITIAL  3.  Pt will improve left shoulder abduction to atleast 175 degrees  Baseline;165  Goal status: initial  4.  Pt will have decreased breast swelling by 50% Baseline:  Goal status: INITIAL  5.  Breast Complaints survey will decrease to 15 to demonstrate improvement in swelling Baseline:  Goal status: INITIAL  6.  Pt will be independent with HEP to improve Shoulder ROM Baseline:  Goal status: INITIAL  PLAN:  PT FREQUENCY: 2x/week  PT DURATION: 6 weeks  PLANNED INTERVENTIONS: 97164- PT Re-evaluation, 97110-Therapeutic exercises, 97535- Self Care, [id number]- Manual therapy, 97760- Orthotic Fit/training, Patient/Family education, Balance training, Joint mobilization, Manual lymph drainage, Scar mobilization, Therapeutic exercises, Therapeutic activity, Neuromuscular re-education, Gait training, and Self Care  PLAN FOR NEXT SESSION: give wall slide for abduction, cont MLD to left breast, MFR to cording under left breast, check bras as she gets new ones.   Idamae Lusher, PT 11/04/2022, 2:56 PM   Manual Lymph Drainage for Left Breast.   Do daily.  Do slowly. Use flat hands with just enough pressure to stretch the skin. Do not slide over the skin, stretch the skin with the hand. (Stretch  Relax  Move) Lie down or sit comfortably (in a recliner, for example) to do this.   Do circles at each collar bone near the neck 5-7 times (to "wake up"  lots of lymph nodes in this area).  Take slow deep breaths, allowing your belly to balloon out as you breathe in, 5x (to "wake up" abdominal lymph nodes).  Do circles in both armpits--stretch skin in small circles to stimulate intact lymph nodes there, 5-7x.  Do Circles in the left groin area, at panty line--stretch skin to stimulate lymph nodes 5-7x.  Redirect fluid from left chest toward right armpit (stretch skin starting at left chest in 3-4 spots working toward right armpit) 3-4x across the chest.  Redirect fluid from left armpit toward left groin (cup your hand around the curve of your left side and do 3-4 "pumps" from armpit to groin) 3-4x down your side.  Draw an imaginary diagonal line from upper outer breast through the nipple area toward lower inner breast.  Direct fluid above this line upward and inward toward the pathway across your chest (established in #5).  Then direct the fluid below this line down and out toward the pathway down your side going towards the left groin (established in #6). Do this for a few minutes or until you feel any improvements.   Then repeat your pathways 2-3x  (#5 and #6)  End with repeating #3 and #4 above  (circles in both armpits and the left groin)

## 2022-11-09 ENCOUNTER — Inpatient Hospital Stay: Payer: Medicare Other | Attending: Hematology and Oncology | Admitting: Adult Health

## 2022-11-09 ENCOUNTER — Encounter: Payer: Self-pay | Admitting: Adult Health

## 2022-11-09 VITALS — BP 146/88 | HR 55 | Temp 97.5°F | Resp 18 | Ht 60.0 in | Wt 145.1 lb

## 2022-11-09 DIAGNOSIS — C50212 Malignant neoplasm of upper-inner quadrant of left female breast: Secondary | ICD-10-CM | POA: Diagnosis not present

## 2022-11-09 DIAGNOSIS — Z170 Estrogen receptor positive status [ER+]: Secondary | ICD-10-CM | POA: Insufficient documentation

## 2022-11-09 MED ORDER — TAMOXIFEN CITRATE 10 MG PO TABS
ORAL_TABLET | ORAL | 0 refills | Status: DC
Start: 2022-11-09 — End: 2022-12-20

## 2022-11-09 NOTE — Patient Instructions (Signed)
 Tamoxifen Solution What is this medication? TAMOXIFEN (ta MOX i fen) prevents and treats breast cancer. It works by blocking the hormone estrogen in breast tissue, which prevents breast cancer cells from spreading or growing. This medicine may be used for other purposes; ask your health care provider or pharmacist if you have questions. COMMON BRAND NAME(S): Soltamox What should I tell my care team before I take this medication? They need to know if you have any of these conditions: Blood clots Endometriosis High cholesterol Irregular menstrual cycles Liver disease Stroke Uterine cancer Uterine fibroids An unusual reaction to tamoxifen, other medications, foods, dyes, or preservatives Pregnant or trying to get pregnant Breast-feeding How should I use this medication? Take this medication by mouth. Take it as directed on the prescription label at the same time every day. You can take it with or without food. If it upsets your stomach, take it with food. Keep taking it unless your care team tells you to stop. A special MedGuide will be given to you by the pharmacist with each prescription and refill. Be sure to read this information carefully each time. Talk to your care team about the use of this medication in children. Special care may be needed. Overdosage: If you think you have taken too much of this medicine contact a poison control center or emergency room at once. NOTE: This medicine is only for you. Do not share this medicine with others. What if I miss a dose? If you miss a dose, take it as soon as you can. If it is almost time for your next dose, take only that dose. Do not take double or extra doses. What may interact with this medication? Do not take this medication with any of the following: Cisapride Dronedarone Pimozide Thioridazine This medication may also interact with the following: Anastrozole Certain medications for seizures, such as carbamazepine, phenobarbital,  phenytoin Letrozole Other medications that cause heart rhythm changes Paroxetine Rifampin Warfarin This list may not describe all possible interactions. Give your health care provider a list of all the medicines, herbs, non-prescription drugs, or dietary supplements you use. Also tell them if you smoke, drink alcohol, or use illegal drugs. Some items may interact with your medicine. What should I watch for while using this medication? Visit your care team for regular checks on your progress. You will need regular pelvic exams, breast exams, and mammograms. Talk to your care team about your risk for uterine cancer. You may be more at risk for uterine cancer if you take this medication. Talk to your care team if you may be pregnant. Serious birth defect can occur if you take this medication during pregnancy and for 2 months after the last dose. You will need a negative pregnancy test before starting this medication. Contraception is recommended while taking this medication and for 2 months after the last dose. Your care team can help you find the option that works for you. Do not breastfeed while taking this medication and for 3 months the last dose, This medication may cause infertility. Talk to your care team if you are concerned about your fertility. What side effects may I notice from receiving this medication? Side effects that you should report to your care team as soon as possible: Allergic reactions--skin rash, itching, hives, swelling of the face, lips, tongue, or throat Blood clot--pain, swelling, or warmth in the leg, shortness of breath, chest pain High calcium level--increased thirst or amount of urine, nausea, vomiting, confusion, unusual weakness or fatigue, bone  pain Infection--fever, chills, cough, or sore throat Irregular menstrual cycles or spotting Liver injury--right upper belly pain, loss of appetite, nausea, light-colored stool, dark yellow or brown urine, yellowing skin or  eyes, unusual weakness or fatigue Low red blood cell count--unusual weakness or fatigue, dizziness, headache, trouble breathing Stroke--sudden numbness or weakness in the face, arm, or leg, trouble speaking, confusion, trouble walking, loss of balance or coordination, dizziness, severe headache, change in vision Unusual bruising or bleeding Vaginal bleeding after menopause, pelvic pain Side effects that usually do not require medical attention (report to your care team if they continue or are bothersome): Hot flashes Mood swings Vaginal discharge This list may not describe all possible side effects. Call your doctor for medical advice about side effects. You may report side effects to FDA at 1-800-FDA-1088. Where should I keep my medication? Keep out of the reach of children and pets. Store at room temperature between 20 and 25 degrees C (68 and 77 degrees F). Do not freeze or refrigerate. Keep this medication in the original container. Protect from light. Get rid of any unused medication 3 months after opening. To get rid of medicines that are no longer needed or have expired: Take the medication to a medication take-back program. Check with your pharmacy or law enforcement to find a location. If you cannot return the medication, check the label or package insert to see if the medication should be thrown out in the garbage or flushed down the toilet. If you are not sure, ask your care team. If it is safe to put in the trash, pour the medication out of the container. Mix the medication with cat litter, dirt, coffee grounds, or other unwanted substance. Seal the mixture in a bag or container. Put it in the trash. NOTE: This sheet is a summary. It may not cover all possible information. If you have questions about this medicine, talk to your doctor, pharmacist, or health care provider.  2024 Elsevier/Gold Standard (2022-05-26 00:00:00)

## 2022-11-09 NOTE — Assessment & Plan Note (Signed)
Janice Gross is a 67 year old woman with history of stage Ia left breast invasive ductal carcinoma status postlumpectomy, adjuvant radiation, and antiestrogen therapy with aromatase inhibitors which she has not been able to tolerate--including exemestane and Anastrozole.  She is here to discuss other options.   Breast Cancer Patient stopped taking Exemestane due to side effects and reports feeling better since discontinuation. Discussed the risks/benefits of starting Tamoxifen vs Letrozole vs no treatment. Patient has no history of blood clots. -Start Tamoxifen 10mg  daily for 1-2 weeks, then increase to 20mg  daily if tolerated. -Contact office if experiencing significant side effects or to request refill of Tamoxifen 20mg  daily. -Annual gynecology follow-up recommended due to slight risk of endometrial cancer with Tamoxifen. -Handout on Tamoxifen given in her AVS.   Sleep Disturbance Improvement noted since stopping Exemestane. Currently getting 5-6 hours of sleep per night, with goal of 7 hours. -Continue current sleep hygiene practices.  Physical Activity Patient reports sedentary job and plans to join a gym. Discussed the benefits of regular movement and standing at work. -Recommend standing and moving around for 5 minutes every hour at work. -Consider use of a standing desk at [facility] [employer] written to her employer in support of this option.     RTC in 02/2023 for follow-up with Dr. Al Pimple.

## 2022-11-09 NOTE — Progress Notes (Signed)
[facility] Cancer Follow up:    [place] [address], [address] [place] [address]   DIAGNOSIS:  Cancer Staging  Malignant neoplasm of upper-inner quadrant of left breast in female, estrogen receptor positive (HCC) Staging form: Breast, AJCC 8th Edition - Clinical stage from 11/04/2021: Stage IA (cT1c, cN0, cM0, G2, ER+, PR+, HER2-) - Signed by Ronny Bacon, PA-C on 11/04/2021 Stage prefix: Initial diagnosis Method of lymph node assessment: Clinical Histologic grading system: 3 grade system - Pathologic: Stage IA (pT2, pN0(sn), cM0, G2, ER+, PR+, HER2-, Oncotype DX score: 19) - Signed by Ronny Bacon, PA-C on 12/21/2021 Stage prefix: Initial diagnosis Method of lymph node assessment: Sentinel lymph node biopsy Multigene prognostic tests performed: Oncotype DX Recurrence score range: Greater than or equal to 11 Histologic grading system: 3 grade system   SUMMARY OF ONCOLOGIC HISTORY: Oncology History  Malignant neoplasm of upper-inner quadrant of left breast in female, estrogen receptor positive (HCC)  10/10/2021 Mammogram   Screening mammogram showed loosely grouped calcifications in the right breast at 3:00 posterior depth, indeterminate, additional views are recommended.  Asymmetry in the left breast posterior depth superior region seen on the mediolateral oblique view only is indeterminate.  Diagnostic mammogram showed a 1 cm irregular mass with calcifications in the left breast, ultrasound was recommended.  Ultrasound confirmed a 1.2 x 0.5 cm irregular mass suspicious for malignancy.  No significant abnormalities in the axilla   10/29/2021 Pathology Results   Pathology results from the left breast biopsy showed invasive ductal carcinoma overall grade 2, prognostic showed ER 95% positive strong staining PR 40% positive strong staining, Ki-67 of 10% tumor cells are negative for HER2 1+ by [facility]   11/04/2021 Cancer  Staging   Staging form: Breast, AJCC 8th Edition - Clinical stage from 11/04/2021: Stage IA (cT1c, cN0, cM0, G2, ER+, PR+, HER2-) - Signed by Ronny Bacon, PA-C on 11/04/2021 Stage prefix: Initial diagnosis Method of lymph node assessment: Clinical Histologic grading system: 3 grade system   11/12/2021 Surgery   Left breast lumpectomy: IDC, g2, 3.6cm, margins neg, 9 SLN negative, T2, N0   11/12/2021 Oncotype testing   19/6%   12/21/2021 Cancer Staging   Staging form: Breast, AJCC 8th Edition - Pathologic: Stage IA (pT2, pN0(sn), cM0, G2, ER+, PR+, HER2-, Oncotype DX score: 19) - Signed by Ronny Bacon, PA-C on 12/21/2021 Stage prefix: Initial diagnosis Method of lymph node assessment: Sentinel lymph node biopsy Multigene prognostic tests performed: Oncotype DX Recurrence score range: Greater than or equal to 11 Histologic grading system: 3 grade system   01/05/2022 - 02/01/2022 Radiation Therapy   Site Technique Total Dose (Gy) Dose per Fx (Gy) Completed Fx Beam Energies  Breast, Left: Breast_L 3D 42.56/42.56 2.66 16/16 10XFFF  Breast, Left: Breast_L_Bst 3D 8/8 2 4/4 6X, 10X     02/2022 -  Anti-estrogen oral therapy   Anastrozole     CURRENT THERAPY:  INTERVAL HISTORY:  Discussed the use of AI scribe software for clinical note transcription with the patient, who gave verbal consent to proceed.  [name] 67 y.o. female,  with a history of breast cancer, reports discontinuation of exemestane due to adverse effects. Since stopping the medication, she has experienced an improvement in sleep and resolution of previous discomfort. She has also been attending physical therapy sessions, which began last week.  The patient's sleep has improved, although she is not yet achieving a full eight hours. She reports  sleeping for five to six hours without waking, an improvement from previous patterns of interrupted sleep. Despite this, she still experiences some nights  where she is wide awake for several hours. On nights where she achieves at least five hours of uninterrupted sleep, she reports feeling less tired the following day.  The patient has recently joined a gym and is due to start attending. Her current job involves prolonged periods of sitting, which she finds challenging. She is considering seeking a part-time role that allows for more movement. She reports some swelling in her legs, which she attributes to lymphedema.  Previously, the patient was on anastrozole, which was also discontinued due to side effects, primarily elevated blood pressure. Both anastrozole and exemestane caused elevated blood pressure and joint aches. The patient is considering starting tamoxifen, another medication for breast cancer, and is aware of the potential side effects, including hot flashes, vaginal wetness, joint aches, and risks of blood clots and endometrial cancer. She has no history of blood clots. She is also considering letrozole, another antiestrogen pill, but is aware it could have similar side effects to anastrozole and exemestane.   Patient Active Problem List   Diagnosis Date Noted   Malignant neoplasm of upper-inner quadrant of left breast in female, estrogen receptor positive (HCC) 11/03/2021   Mitral valve prolapse 12/03/2015   Anxiety 05/08/2015   Essential hypertension 05/08/2015   Genetic testing 11/23/2013   Family history of breast cancer in first degree relative 08/09/2012   Vaginal atrophy 08/09/2012   Menopause syndrome 08/09/2012   Dyspepsia and other specified disorders of function of stomach 05/03/2012   History of neurological disorder 04/04/2012   Overweight 07/05/2011   Atrophic vaginitis 07/05/2011   Family history of breast cancer in mother 07/05/2011   Recurrent yeast infection 07/02/2011   ANEMIA 12/25/2008   CONSTIPATION 12/25/2008   NECK PAIN 12/25/2008   HEADACHE 12/25/2008   PALPITATIONS 12/25/2008   ANXIETY 03/24/2007    ALLERGIC RHINITIS 03/24/2007   GERD 03/24/2007   IRRITABLE BOWEL SYNDROME 03/24/2007   GESTATIONAL DIABETES 03/24/2007   MITRAL VALVE PROLAPSE, HX OF 03/24/2007    is allergic to ace inhibitors, aspirin, doxycycline, fluogen [influenza virus vaccine], hydrochlorothiazide, iodine, and other.  MEDICAL HISTORY: Past Medical History:  Diagnosis Date   Allergic rhinitis    Anemia    pt states she has never heard that she had this   Anxiety    Constipation    Family history of adverse reaction to anesthesia    niece is hard to put to sleep and "sensitive to anesthesia"   GERD (gastroesophageal reflux disease)    Gestational diabetes    Headache(784.0)    Hypertension    IBS (irritable bowel syndrome)    Mitral valve prolapse    Neck pain    Neuromuscular disorder (HCC)    Pt reports takign FLU shot in the 90s which caused leg numbmess which has gone away, no tingling "from time to time"   Palpitations     SURGICAL HISTORY: Past Surgical History:  Procedure Laterality Date   BREAST LUMPECTOMY WITH RADIOACTIVE SEED AND SENTINEL LYMPH NODE BIOPSY Left 11/12/2021   Procedure: LEFT BREAST BRACKETED LUMPECTOMY WITH RADIOACTIVE SEED X2 AND SENTINEL LYMPH NODE BIOPSY;  Surgeon: Abigail Miyamoto, MD;  Location: [facility] OR;  Service: General;  Laterality: Left;  60 MIN ROOM 2   c section x 1     COLONOSCOPY  2006   Dr. Gloriajean Dell    LAPAROSCOPY  surgery to remove ovarian cyst      SOCIAL HISTORY: Social History   Socioeconomic History   Marital status: Divorced    Spouse name: Not on file   Number of children: 1   Years of education: Not on file   Highest education level: Not on file  Occupational History   Occupation: Biochemist, clinical: [employer]  Tobacco Use   Smoking status: Never   Smokeless tobacco: Never   Tobacco comments:    tried as a teenager  Advertising account planner   Vaping status: Never Used  Substance and Sexual Activity   Alcohol use: No    Alcohol/week:  0.0 standard drinks of alcohol   Drug use: No   Sexual activity: Not Currently    Birth control/protection: None  Other Topics Concern   Not on file  Social History Narrative   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 - reports loves her job      Home Situation: lives alone      Spiritual Beliefs: Christian      Lifestyle:walking on a regular basis; diet is fair         Social Determinants of Health   Financial Resource Strain: Low Risk  (11/04/2021)   Overall Financial Resource Strain (CARDIA)    Difficulty of Paying Living Expenses: Not very hard  Food Insecurity: No Food Insecurity (11/04/2021)   Hunger Vital Sign    Worried About Running Out of Food in the Last Year: Never true    Ran Out of Food in the Last Year: Never true  Transportation Needs: No Transportation Needs (11/04/2021)   PRAPARE - Administrator, Civil Service (Medical): No    Lack of Transportation (Non-Medical): No  Physical Activity: Not on file  Stress: Not on file  Social Connections: Not on file  Intimate Partner Violence: Not on file    FAMILY HISTORY: Family History  Problem Relation Age of Onset   Breast cancer Sister 40   Diabetes Sister    Breast cancer Sister 68   Diabetes Sister    Breast cancer Mother    Ovarian cancer Sister 51   Heart disease Father    Diabetes Brother    Lung cancer Brother    Breast cancer Other        dx in her 50s/dx in her 43s   Colon cancer Other 23   Lung cancer Other        dx in his 41s   Stomach cancer Neg Hx     Review of Systems  Constitutional:  Negative for appetite change, chills, fatigue, fever and unexpected weight change.  HENT:   Negative for hearing loss, lump/mass and trouble swallowing.   Eyes:  Negative for eye problems and icterus.  Respiratory:  Negative for chest tightness, cough and shortness of breath.   Cardiovascular:  Negative for chest pain, leg swelling and  palpitations.  Gastrointestinal:  Negative for abdominal distention, abdominal pain, constipation, diarrhea, nausea and vomiting.  Endocrine: Negative for hot flashes.  Genitourinary:  Negative for difficulty urinating.   Musculoskeletal:  Negative for arthralgias.  Skin:  Negative for itching and rash.  Neurological:  Negative for dizziness, extremity weakness, headaches and numbness.  Hematological:  Negative for adenopathy. Does not bruise/bleed easily.  Psychiatric/Behavioral:  Negative for depression. The patient is not nervous/anxious.       PHYSICAL EXAMINATION    Vitals:   11/09/22  0947  BP: (!) 146/88  Pulse: (!) 55  Resp: 18  Temp: (!) 97.5 F (36.4 C)  SpO2: 100%  Exam deferred in lieu of counseling  ASSESSMENT and THERAPY PLAN:   Malignant neoplasm of upper-inner quadrant of left breast in female, estrogen receptor positive (HCC) Janice Gross is a 67 year old woman with history of stage Ia left breast invasive ductal carcinoma status postlumpectomy, adjuvant radiation, and antiestrogen therapy with aromatase inhibitors which she has not been able to tolerate--including exemestane and Anastrozole.  She is here to discuss other options.   Breast Cancer Patient stopped taking Exemestane due to side effects and reports feeling better since discontinuation. Discussed the risks/benefits of starting Tamoxifen vs Letrozole vs no treatment. Patient has no history of blood clots. -Start Tamoxifen 10mg  daily for 1-2 weeks, then increase to 20mg  daily if tolerated. -Contact office if experiencing significant side effects or to request refill of Tamoxifen 20mg  daily. -Annual gynecology follow-up recommended due to slight risk of endometrial cancer with Tamoxifen. -Handout on Tamoxifen given in her AVS.   Sleep Disturbance Improvement noted since stopping Exemestane. Currently getting 5-6 hours of sleep per night, with goal of 7 hours. -Continue current sleep hygiene  practices.  Physical Activity Patient reports sedentary job and plans to join a gym. Discussed the benefits of regular movement and standing at work. -Recommend standing and moving around for 5 minutes every hour at work. -Consider use of a standing desk at [facility] written to her employer in support of this option.     RTC in 02/2023 for follow-up with Dr. Al Pimple.   All questions were answered. The patient knows to call the clinic with any problems, questions or concerns. We can certainly see the patient much sooner if necessary.  Total encounter time:40 minutes*in face-to-face visit time, chart review, lab review, care coordination, order entry, and documentation of the encounter time.  Lillard Anes, NP 11/09/22 10:34 AM Medical Oncology and Hematology [facility] [address] [place], [place] [address] Tel. [phone]    Fax. [phone]  *Total Encounter Time as defined by the Centers for Medicare and Medicaid Services includes, in addition to the face-to-face time of a patient visit (documented in the note above) non-face-to-face time: obtaining and reviewing outside history, ordering and reviewing medications, tests or procedures, care coordination (communications with other health care professionals or caregivers) and documentation in the medical record.

## 2022-11-15 ENCOUNTER — Ambulatory Visit: Payer: Medicare Other

## 2022-11-15 ENCOUNTER — Ambulatory Visit: Payer: Medicare Other | Attending: Adult Health

## 2022-11-15 DIAGNOSIS — C50212 Malignant neoplasm of upper-inner quadrant of left female breast: Secondary | ICD-10-CM | POA: Insufficient documentation

## 2022-11-15 DIAGNOSIS — M25612 Stiffness of left shoulder, not elsewhere classified: Secondary | ICD-10-CM | POA: Insufficient documentation

## 2022-11-15 DIAGNOSIS — I890 Lymphedema, not elsewhere classified: Secondary | ICD-10-CM | POA: Diagnosis present

## 2022-11-15 NOTE — Therapy (Signed)
OUTPATIENT PHYSICAL THERAPY  UPPER EXTREMITY ONCOLOGY TREATMENT  Patient Name: Janice Gross MRN: [id number] DOB:1955/09/20, 67 y.o., female Today's Date: 11/15/2022  END OF SESSION:  PT End of Session - 11/15/22 1359     Visit Number 3    Number of Visits 12    Date for PT Re-Evaluation 12/06/22    PT Start Time 1402    PT Stop Time 1500    PT Time Calculation (min) 58 min    Activity Tolerance Patient tolerated treatment well    Behavior During Therapy WFL for tasks assessed/performed              Past Medical History:  Diagnosis Date   Allergic rhinitis    Anemia    pt states she has never heard that she had this   Anxiety    Constipation    Family history of adverse reaction to anesthesia    niece is hard to put to sleep and "sensitive to anesthesia"   GERD (gastroesophageal reflux disease)    Gestational diabetes    Headache(784.0)    Hypertension    IBS (irritable bowel syndrome)    Mitral valve prolapse    Neck pain    Neuromuscular disorder (HCC)    Pt reports takign FLU shot in the 90s which caused leg numbmess which has gone away, no tingling "from time to time"   Palpitations    Past Surgical History:  Procedure Laterality Date   BREAST LUMPECTOMY WITH RADIOACTIVE SEED AND SENTINEL LYMPH NODE BIOPSY Left 11/12/2021   Procedure: LEFT BREAST BRACKETED LUMPECTOMY WITH RADIOACTIVE SEED X2 AND SENTINEL LYMPH NODE BIOPSY;  Surgeon: Abigail Miyamoto, MD;  Location: [facility] OR;  Service: General;  Laterality: Left;  60 MIN ROOM 2   c section x 1     COLONOSCOPY  2006   Dr. Gloriajean Dell    LAPAROSCOPY     surgery to remove ovarian cyst     Patient Active Problem List   Diagnosis Date Noted   Malignant neoplasm of upper-inner quadrant of left breast in female, estrogen receptor positive (HCC) 11/03/2021   Mitral valve prolapse 12/03/2015   Anxiety 05/08/2015   Essential hypertension 05/08/2015   Genetic testing 11/23/2013   Family history of breast  cancer in first degree relative 08/09/2012   Vaginal atrophy 08/09/2012   Menopause syndrome 08/09/2012   Dyspepsia and other specified disorders of function of stomach 05/03/2012   History of neurological disorder 04/04/2012   Overweight 07/05/2011   Atrophic vaginitis 07/05/2011   Family history of breast cancer in mother 07/05/2011   Recurrent yeast infection 07/02/2011   ANEMIA 12/25/2008   CONSTIPATION 12/25/2008   NECK PAIN 12/25/2008   HEADACHE 12/25/2008   PALPITATIONS 12/25/2008   ANXIETY 03/24/2007   ALLERGIC RHINITIS 03/24/2007   GERD 03/24/2007   IRRITABLE BOWEL SYNDROME 03/24/2007   GESTATIONAL DIABETES 03/24/2007   MITRAL VALVE PROLAPSE, HX OF 03/24/2007      REFERRING PROVIDER: Lillard Anes, NP  REFERRING DIAG: Left Breast lymphedema  THERAPY DIAG:  Malignant neoplasm of upper-inner quadrant of left female breast, unspecified estrogen receptor status (HCC)  Lymphedema of breast  Stiffness of left shoulder, not elsewhere classified  ONSET DATE: September  Rationale for Evaluation and Treatment: Rehabilitation  SUBJECTIVE:  SUBJECTIVE STATEMENT:  The cord under the breast is better. The soreness in the axillary region is better. I have been doing the MLD and I am not feeling the discomfort I did when I first came in.  The little hard spot is still there around the incision, and sometimes its a little tender. I went back to my compression bra but it doesn't help I have been wearing my sleeve every day since you did my screen.Marland Kitchen  EVAL: Pt with increased complaints of left breast swelling and tightness since September 2024.The swelling and tightness happened around the time she was taking the Cancer drugs.  She found an area under her left breast that she thinks may be a cord. She  had  prior cording in her left UE. She has a compression bra  but feels like it is too loose so she wears a sports bra that she likes better.  PERTINENT HISTORY:  Patient was diagnosed on 10/10/2021 with left grade 2 invasive ductal carcinoma breast cancer. It measures 1.2 cm and is located in the upper inner quadrant. It is ER/PR positive and HER2 negative with a Ki67 of 10%. 11/12/21- L breast lumpectomy and SLNB (0/9) . She has been having Sozo screens and had to wear a sleeve for 30 days. She is 0.1 below the yellow zone and she was  instructed to start wearing her sleeve again   PAIN:  Are you having pain? No, just discomfort  PRECAUTIONS: Left UE lymphedema risk  RED FLAGS: None   WEIGHT BEARING RESTRICTIONS: No  FALLS:  Has patient fallen in last 6 months? 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  HAND DOMINANCE: right   PRIOR LEVEL OF FUNCTION: Independent  PATIENT GOALS:  Decrease left breast swelling, check cording under breast   OBJECTIVE: Note: Objective measures were completed at Evaluation unless otherwise noted.  COGNITION: Overall cognitive status: Within functional limits for tasks assessed   PALPATION: Breast incision restricted with nodular area above incision, mildly tender  OBSERVATIONS / OTHER ASSESSMENTS: palpable cord under left breast running vertically, generalized left breast swelling greatest laterally, no significant enlarged pores but bra marks present  SENSATION: Light touch: Deficits     POSTURE: Forward head and rounded shoulders posture   UPPER EXTREMITY AROM/PROM:  A/PROM RIGHT   eval   Shoulder extension 64  Shoulder flexion 141  Shoulder abduction 180  Shoulder internal rotation   Shoulder external rotation     (Blank rows = not  tested)  A/PROM LEFT   eval  Shoulder extension 70  Shoulder flexion 147  Shoulder abduction 165  Shoulder internal rotation   Shoulder external rotation     (Blank rows = not tested)  CERVICAL AROM: All within functional  limits:    UPPER EXTREMITY STRENGTH:   LYMPHEDEMA ASSESSMENTS:   SURGERY TYPE/DATE: Left Breast Lumpectomy with SLNB  NUMBER OF LYMPH NODES REMOVED: 0/9  CHEMOTHERAPY: NO  RADIATION:YES  HORMONE TREATMENT: Exemestane  INFECTIONS: NO   LYMPHEDEMA ASSESSMENTS:   LANDMARK RIGHT  eval  At axilla  26.7  15 cm proximal to olecranon process   10 cm proximal to olecranon process 24.0  Olecranon process 22.6  15 cm proximal to ulnar styloid process 22.7  10 cm proximal to ulnar styloid process 20.45  Just proximal to ulnar  styloid process 15.35  Across hand at thumb web space 19.1  At base of 2nd digit 6.5  (Blank rows = not tested)  LANDMARK LEFT  eval  At axilla  26.2  15 cm proximal to olecranon process   10 cm proximal to olecranon process 25.3  Olecranon process 23.55  15 cm proximal to ulnar styloid process 23.7  10 cm proximal to ulnar styloid process 21.0  Just proximal to ulnar styloid process 15.9  Across hand at thumb web space 18.8  At base of 2nd digit 6.8  (Blank rows = not tested)   FUNCTIONAL TESTS:    GAIT:WNL  L-DEX LYMPHEDEMA SCREENING: The patient was assessed using the L-Dex machine today to produce a lymphedema index baseline score. The patient will be reassessed on a regular basis (typically every 3 months) to obtain new L-Dex scores. If the score is > 6.5 points away from his/her baseline score indicating onset of subclinical lymphedema, it will be recommended to wear a compression garment for 4 weeks, 12 hours per day and then be reassessed. If the score continues to be > 6.5 points from baseline at reassessment, we will initiate lymphedema treatment. Assessing in this manner has a 95% rate of preventing clinically  significant lymphedema. 10/25/2022   6.4 above baseline making her on the borderline of green and yellow;Advised to start wearing her sleeve daily before we retest again  QUICK DASH SURVEY: NA  BREAST COMPLAINTS QUESTIONNAIRE Pain:2 Heaviness:7 Swollen feeling:8 Tense Skin:8 Redness:0 Bra Print:4 Size of Pores:3 Hard feeling: 6 Total:   38  /80 A Score over 9 indicates lymphedema issues in the breast    TODAY'S TREATMENT:                                                                                                                                         Pt permission and consent throughout each step of examination and treatment with modification and draping if requested when working on sensitive areas  DATE:  11/15/2022 Wall slides for abduction x4 on left In supine: Short neck, 5 diaphragmatic breaths, R axillary nodes and establishment of interaxillary pathway, L inguinal nodes and establishment of axilloinguinal pathway, then L breast moving fluid towards pathways spending extra time in any areas of fibrosis then retracing all steps. Pt instructed in anatomy and was shown pictures of pathways. Therapist performed all aspects and then had pt practice same technqiue. Practiced axillo-inguinal pathway in supine and SL and found SL the easiest to reach MFR to left axillary region of cording. No cording palpated under breast today Pt given script to go to [facility] [place] to get a compression bra, also gave illustrated handout for MLD  11/04/22 With pt permission for breast MLD Education on lymphatic anatomy and drainage patterns of the breast and principles of MLD Performed each step in supine per instruction section below with PT reading and  then performing each step and then with hand over hand cueing and performance by pt with modification as needed - noted below.  MFR and STM with cocoa butter to the Lt axilla with arm propped overhead.   Discussed POC, visit number, MLD to  breast, MFR to cording    PATIENT EDUCATION:  Education details: POC, visit number, MLD to breast, MFR to cording, SOZO screen and start wearing sleeve again Person educated: Patient Education method: Explanation Education comprehension: verbalized understanding  HOME EXERCISE PROGRAM:   ASSESSMENT:  CLINICAL IMPRESSION: Continued education in MLD and pt noted that she had been doing the interaxillary pathway in the wrong direction. She did well with techniques today but had some difficulty with sliding on occasion improved with slight amount of lotion on her hand. 2 firm nodular areas noted above and below the breast incision. Pt noted it was checked with her mammogram and they called it scar tissue. No tenderness at axillary border of pectorals.   OBJECTIVE IMPAIRMENTS: decreased knowledge of condition, decreased ROM, increased edema, increased fascial restrictions, and postural dysfunction.   ACTIVITY LIMITATIONS: reach over head  PARTICIPATION LIMITATIONS:  able to do all but with breast swelling  PERSONAL FACTORS: 1-2 comorbidities: left breast cancer s/p radiation  are also affecting patient's functional outcome.   REHAB POTENTIAL: Good  CLINICAL DECISION MAKING: Stable/uncomplicated  EVALUATION COMPLEXITY: Low  GOALS: Goals reviewed with patient? Yes  SHORT TERM GOALS=LONG TERM GOALS: Target date: 12/06/2022  Pt will be compliant with compression bra/sports bra to decrease swelling Baseline: Goal status: INITIAL  2.  Pt will be independent with MLD to decrease left breast swelling Baseline:  Goal status: INITIAL  3.  Pt will improve left shoulder abduction to atleast 175 degrees  Baseline;165  Goal status: initial  4.  Pt will have decreased breast swelling by 50% Baseline:  Goal status: INITIAL  5.  Breast Complaints survey will decrease to 15 to demonstrate improvement in swelling Baseline:  Goal status: INITIAL  6.  Pt will be independent with HEP to  improve Shoulder ROM Baseline:  Goal status: INITIAL  PLAN:  PT FREQUENCY: 2x/week  PT DURATION: 6 weeks  PLANNED INTERVENTIONS: 97164- PT Re-evaluation, 97110-Therapeutic exercises, 97535- Self Care, [id number]- Manual therapy, 97760- Orthotic Fit/training, Patient/Family education, Balance training, Joint mobilization, Manual lymph drainage, Scar mobilization, Therapeutic exercises, Therapeutic activity, Neuromuscular re-education, Gait training, and Self Care  PLAN FOR NEXT SESSION: give wall slide for abduction, cont MLD to left breast, MFR to cording under left breast, check bras as she gets new ones.   Waynette Buttery, PT 11/15/2022, 3:10 PM   Manual Lymph Drainage for Left Breast.   Do daily.  Do slowly. Use flat hands with just enough pressure to stretch the skin. Do not slide over the skin, stretch the skin with the hand. (Stretch  Relax  Move) Lie down or sit comfortably (in a recliner, for example) to do this.   Do circles at each collar bone near the neck 5-7 times (to "wake up" lots of lymph nodes in this area).  Take slow deep breaths, allowing your belly to balloon out as you breathe in, 5x (to "wake up" abdominal lymph nodes).  Do circles in both armpits--stretch skin in small circles to stimulate intact lymph nodes there, 5-7x.  Do Circles in the left groin area, at panty line--stretch skin to stimulate lymph nodes 5-7x.  Redirect fluid from left chest toward right armpit (stretch skin starting at left chest in 3-4  spots working toward right armpit) 3-4x across the chest.  Redirect fluid from left armpit toward left groin (cup your hand around the curve of your left side and do 3-4 "pumps" from armpit to groin) 3-4x down your side.  Draw an imaginary diagonal line from upper outer breast through the nipple area toward lower inner breast.  Direct fluid above this line upward and inward toward the pathway across your chest (established in #5).  Then direct the fluid  below this line down and out toward the pathway down your side going towards the left groin (established in #6). Do this for a few minutes or until you feel any improvements.   Then repeat your pathways 2-3x  (#5 and #6)  End with repeating #3 and #4 above  (circles in both armpits and the left groin)

## 2022-11-17 ENCOUNTER — Encounter: Payer: Medicare Other | Admitting: Rehabilitation

## 2022-11-29 ENCOUNTER — Ambulatory Visit: Payer: Medicare Other | Attending: Adult Health

## 2022-11-29 DIAGNOSIS — I890 Lymphedema, not elsewhere classified: Secondary | ICD-10-CM | POA: Diagnosis present

## 2022-11-29 DIAGNOSIS — C50212 Malignant neoplasm of upper-inner quadrant of left female breast: Secondary | ICD-10-CM | POA: Insufficient documentation

## 2022-11-29 DIAGNOSIS — M25612 Stiffness of left shoulder, not elsewhere classified: Secondary | ICD-10-CM | POA: Insufficient documentation

## 2022-11-29 NOTE — Therapy (Signed)
OUTPATIENT PHYSICAL THERAPY  UPPER EXTREMITY ONCOLOGY TREATMENT  Patient Name: Janice Gross MRN: [id number] DOB:1955/02/26, 67 y.o., female Today's Date: 11/29/2022  END OF SESSION:  PT End of Session - 11/29/22 1500     Visit Number 4    Number of Visits 12    Date for PT Re-Evaluation 12/06/22    PT Start Time 1501    PT Stop Time 1556    PT Time Calculation (min) 55 min    Activity Tolerance Patient tolerated treatment well    Behavior During Therapy WFL for tasks assessed/performed              Past Medical History:  Diagnosis Date   Allergic rhinitis    Anemia    pt states she has never heard that she had this   Anxiety    Constipation    Family history of adverse reaction to anesthesia    niece is hard to put to sleep and "sensitive to anesthesia"   GERD (gastroesophageal reflux disease)    Gestational diabetes    Headache(784.0)    Hypertension    IBS (irritable bowel syndrome)    Mitral valve prolapse    Neck pain    Neuromuscular disorder (HCC)    Pt reports takign FLU shot in the 90s which caused leg numbmess which has gone away, no tingling "from time to time"   Palpitations    Past Surgical History:  Procedure Laterality Date   BREAST LUMPECTOMY WITH RADIOACTIVE SEED AND SENTINEL LYMPH NODE BIOPSY Left 11/12/2021   Procedure: LEFT BREAST BRACKETED LUMPECTOMY WITH RADIOACTIVE SEED X2 AND SENTINEL LYMPH NODE BIOPSY;  Surgeon: Abigail Miyamoto, MD;  Location: [facility] OR;  Service: General;  Laterality: Left;  60 MIN ROOM 2   c section x 1     COLONOSCOPY  2006   Dr. Gloriajean Dell    LAPAROSCOPY     surgery to remove ovarian cyst     Patient Active Problem List   Diagnosis Date Noted   Malignant neoplasm of upper-inner quadrant of left breast in female, estrogen receptor positive (HCC) 11/03/2021   Mitral valve prolapse 12/03/2015   Anxiety 05/08/2015   Essential hypertension 05/08/2015   Genetic testing 11/23/2013   Family history of breast  cancer in first degree relative 08/09/2012   Vaginal atrophy 08/09/2012   Menopause syndrome 08/09/2012   Dyspepsia and other specified disorders of function of stomach 05/03/2012   History of neurological disorder 04/04/2012   Overweight 07/05/2011   Atrophic vaginitis 07/05/2011   Family history of breast cancer in mother 07/05/2011   Recurrent yeast infection 07/02/2011   ANEMIA 12/25/2008   CONSTIPATION 12/25/2008   NECK PAIN 12/25/2008   HEADACHE 12/25/2008   PALPITATIONS 12/25/2008   ANXIETY 03/24/2007   ALLERGIC RHINITIS 03/24/2007   GERD 03/24/2007   IRRITABLE BOWEL SYNDROME 03/24/2007   GESTATIONAL DIABETES 03/24/2007   MITRAL VALVE PROLAPSE, HX OF 03/24/2007      REFERRING PROVIDER: Lillard Anes, NP  REFERRING DIAG: Left Breast lymphedema  THERAPY DIAG:  Malignant neoplasm of upper-inner quadrant of left female breast, unspecified estrogen receptor status (HCC)  Lymphedema of breast  Stiffness of left shoulder, not elsewhere classified  ONSET DATE: September  Rationale for Evaluation and Treatment: Rehabilitation  SUBJECTIVE:  SUBJECTIVE STATEMENT:   .I have not gotten new compression bras yet. I called but never heard back from that. I am wearing a regular bra but without the underwire. I am doing the MLD myself. My chest feels tight when I move in certain directions. Swelling is better.  EVAL: Pt with increased complaints of left breast swelling and tightness since September 2024.The swelling and tightness happened around the time she was taking the Cancer drugs.  She found an area under her left breast that she thinks may be a cord. She had  prior cording in her left UE. She has a compression bra  but feels like it is too loose so she wears a sports bra that she likes  better.  PERTINENT HISTORY:  Patient was diagnosed on 10/10/2021 with left grade 2 invasive ductal carcinoma breast cancer. It measures 1.2 cm and is located in the upper inner quadrant. It is ER/PR positive and HER2 negative with a Ki67 of 10%. 11/12/21- L breast lumpectomy and SLNB (0/9) . She has been having Sozo screens and had to wear a sleeve for 30 days. She is 0.1 below the yellow zone and she was  instructed to start wearing her sleeve again   PAIN:  Are you having pain? No, just discomfort  PRECAUTIONS: Left UE lymphedema risk  RED FLAGS: None   WEIGHT BEARING RESTRICTIONS: No  FALLS:  Has patient fallen in last 6 months? 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  HAND DOMINANCE: right   PRIOR LEVEL OF FUNCTION: Independent  PATIENT GOALS:  Decrease left breast swelling, check cording under breast   OBJECTIVE: Note: Objective measures were completed at Evaluation unless otherwise noted.  COGNITION: Overall cognitive status: Within functional limits for tasks assessed   PALPATION: Breast incision restricted with nodular area above incision, mildly tender  OBSERVATIONS / OTHER ASSESSMENTS: palpable cord under left breast running vertically, generalized left breast swelling greatest laterally, no significant enlarged pores but bra marks present  SENSATION: Light touch: Deficits     POSTURE: Forward head and rounded shoulders posture   UPPER EXTREMITY AROM/PROM:  A/PROM RIGHT   eval  RIGHT 11/29/2022  Shoulder extension 64   Shoulder flexion 141 157  Shoulder abduction 180 168  Shoulder internal rotation    Shoulder external rotation      (Blank rows = not tested)  A/PROM LEFT   eval LEFT 11/29/2022  Shoulder extension 70   Shoulder flexion 147 155  Shoulder  abduction 165 169  Shoulder internal rotation    Shoulder external rotation      (Blank rows = not tested)  CERVICAL AROM: All within functional  limits:    UPPER EXTREMITY STRENGTH:   LYMPHEDEMA ASSESSMENTS:   SURGERY TYPE/DATE: Left Breast Lumpectomy with SLNB  NUMBER OF LYMPH NODES REMOVED: 0/9  CHEMOTHERAPY: NO  RADIATION:YES  HORMONE TREATMENT: Exemestane  INFECTIONS: NO   LYMPHEDEMA ASSESSMENTS:   LANDMARK RIGHT  eval  At axilla  26.7  15 cm proximal to olecranon process   10 cm proximal to olecranon process 24.0  Olecranon process 22.6  15 cm proximal to ulnar styloid process 22.7  10 cm proximal to ulnar styloid process 20.45  Just proximal to ulnar styloid process 15.35  Across hand at thumb web space 19.1  At base of 2nd  digit 6.5  (Blank rows = not tested)  LANDMARK LEFT  eval  At axilla  26.2  15 cm proximal to olecranon process   10 cm proximal to olecranon process 25.3  Olecranon process 23.55  15 cm proximal to ulnar styloid process 23.7  10 cm proximal to ulnar styloid process 21.0  Just proximal to ulnar styloid process 15.9  Across hand at thumb web space 18.8  At base of 2nd digit 6.8  (Blank rows = not tested)   FUNCTIONAL TESTS:    GAIT:WNL  L-DEX LYMPHEDEMA SCREENING: The patient was assessed using the L-Dex machine today to produce a lymphedema index baseline score. The patient will be reassessed on a regular basis (typically every 3 months) to obtain new L-Dex scores. If the score is > 6.5 points away from his/her baseline score indicating onset of subclinical lymphedema, it will be recommended to wear a compression garment for 4 weeks, 12 hours per day and then be reassessed. If the score continues to be > 6.5 points from baseline at reassessment, we will initiate lymphedema treatment. Assessing in this manner has a 95% rate of preventing clinically significant lymphedema. 10/25/2022   6.4 above baseline making her on the  borderline of green and yellow;Advised to start wearing her sleeve daily before we retest again  QUICK DASH SURVEY: NA  BREAST COMPLAINTS QUESTIONNAIRE Pain:2 Heaviness:7 Swollen feeling:8 Tense Skin:8 Redness:0 Bra Print:4 Size of Pores:3 Hard feeling: 6 Total:   38  /80 A Score over 9 indicates lymphedema issues in the breast    TODAY'S TREATMENT:                                                                                                                                         Pt permission and consent throughout each step of examination and treatment with modification and draping if requested when working on sensitive areas  DATE:   11/29/2022  Wall slides flex and abd x 5 Measured AROM B MFR to left axillary region of cording n supine: Short neck, 5 diaphragmatic breaths, R axillary nodes and establishment of interaxillary pathway, L inguinal nodes and establishment of axilloinguinal pathway, then L breast moving fluid towards pathways spending extra time in any areas of fibrosis then retracing all steps. Pt instructed in anatomy and was shown pictures of pathways. Therapist performed all aspects and then had pt practice same technqiue. Practiced axillo-inguinal pathway in supine and SL and found SL the easiest to reach 11/15/2022 Wall slides for abduction x4 on left In supine: Short neck, 5 diaphragmatic breaths, R axillary nodes and establishment of interaxillary pathway, L inguinal nodes and establishment of axilloinguinal pathway, then L breast moving fluid towards pathways spending extra time in any areas of fibrosis then retracing all steps. Pt instructed in anatomy and was shown pictures of pathways. Therapist performed all aspects and then had pt practice same technqiue. Practiced  axillo-inguinal pathway in supine and SL and found SL the easiest to reach MFR to left axillary region of cording. No cording palpated under breast today Pt given script to go to [facility] to get a compression bra, also gave illustrated handout for MLD  11/04/22 With pt permission for breast MLD Education on lymphatic anatomy and drainage patterns of the breast and principles of MLD Performed each step in supine per instruction section below with PT reading and then performing each step and then with hand over hand cueing and performance by pt with modification as needed - noted below.  MFR and STM with cocoa butter to the Lt axilla with arm propped overhead.   Discussed POC, visit number, MLD to breast, MFR to cording    PATIENT EDUCATION:  Education details: POC, visit number, MLD to breast, MFR to cording, SOZO screen and start wearing sleeve again Person educated: Patient Education method: Explanation Education comprehension: verbalized understanding  HOME EXERCISE PROGRAM:   ASSESSMENT:  CLINICAL IMPRESSION:  Pt improved with shoulder ROM and compliant with compression sleeve. Pt still requires review of MLD techniques for the breast in regards to technique and direction of stretch. Advised pt to try in the mirror. Pt also advised to try and go by [facility] today to see if she can get compression bra.  OBJECTIVE IMPAIRMENTS: decreased knowledge of condition, decreased ROM, increased edema, increased fascial restrictions, and postural dysfunction.   ACTIVITY LIMITATIONS: reach over head  PARTICIPATION LIMITATIONS:  able to do all but with breast swelling  PERSONAL FACTORS: 1-2 comorbidities: left breast cancer s/p radiation  are also affecting patient's functional outcome.   REHAB POTENTIAL: Good  CLINICAL DECISION MAKING: Stable/uncomplicated  EVALUATION COMPLEXITY: Low  GOALS: Goals reviewed with patient? Yes  SHORT TERM GOALS=LONG TERM GOALS: Target date: 12/06/2022  Pt will be compliant with compression bra/sports bra to decrease swelling Baseline: Goal status: INITIAL  2.  Pt will be independent with MLD to decrease left breast  swelling Baseline:  Goal status: INITIAL  3.  Pt will improve left shoulder abduction to atleast 175 degrees  Baseline;165  Goal status: initial  4.  Pt will have decreased breast swelling by 50% Baseline:  Goal status: MET 11/2032  5.  Breast Complaints survey will decrease to 15 to demonstrate improvement in swelling Baseline:  Goal status: INITIAL  6.  Pt will be independent with HEP to improve Shoulder ROM Baseline:  Goal status: MET 11/29/2022  PLAN:  PT FREQUENCY: 2x/week  PT DURATION: 6 weeks  PLANNED INTERVENTIONS: 97164- PT Re-evaluation, 97110-Therapeutic exercises, 97535- Self Care, [id number]- Manual therapy, 97760- Orthotic Fit/training, Patient/Family education, Balance training, Joint mobilization, Manual lymph drainage, Scar mobilization, Therapeutic exercises, Therapeutic activity, Neuromuscular re-education, Gait training, and Self Care  PLAN FOR NEXT SESSION: SOZO before DC,give wall slide for abduction, cont MLD to left breast, MFR to cording under left breast, check bras as she gets new ones.   Waynette Buttery, PT 11/29/2022, 3:57 PM   Manual Lymph Drainage for Left Breast.   Do daily.  Do slowly. Use flat hands with just enough pressure to stretch the skin. Do not slide over the skin, stretch the skin with the hand. (Stretch  Relax  Move) Lie down or sit comfortably (in a recliner, for example) to do this.   Do circles at each collar bone near the neck 5-7 times (to "wake up" lots of lymph nodes in this area).  Take slow deep breaths, allowing your  belly to balloon out as you breathe in, 5x (to "wake up" abdominal lymph nodes).  Do circles in both armpits--stretch skin in small circles to stimulate intact lymph nodes there, 5-7x.  Do Circles in the left groin area, at panty line--stretch skin to stimulate lymph nodes 5-7x.  Redirect fluid from left chest toward right armpit (stretch skin starting at left chest in 3-4 spots working toward right  armpit) 3-4x across the chest.  Redirect fluid from left armpit toward left groin (cup your hand around the curve of your left side and do 3-4 "pumps" from armpit to groin) 3-4x down your side.  Draw an imaginary diagonal line from upper outer breast through the nipple area toward lower inner breast.  Direct fluid above this line upward and inward toward the pathway across your chest (established in #5).  Then direct the fluid below this line down and out toward the pathway down your side going towards the left groin (established in #6). Do this for a few minutes or until you feel any improvements.   Then repeat your pathways 2-3x  (#5 and #6)  End with repeating #3 and #4 above  (circles in both armpits and the left groin)

## 2022-12-07 ENCOUNTER — Ambulatory Visit: Payer: Medicare Other | Attending: Surgery

## 2022-12-07 DIAGNOSIS — C50212 Malignant neoplasm of upper-inner quadrant of left female breast: Secondary | ICD-10-CM

## 2022-12-07 DIAGNOSIS — I890 Lymphedema, not elsewhere classified: Secondary | ICD-10-CM | POA: Diagnosis present

## 2022-12-07 DIAGNOSIS — M25612 Stiffness of left shoulder, not elsewhere classified: Secondary | ICD-10-CM

## 2022-12-07 NOTE — Therapy (Signed)
OUTPATIENT PHYSICAL THERAPY  UPPER EXTREMITY ONCOLOGY TREATMENT  Patient Name: Janice Gross MRN: [id number] DOB:December 02, 1955, 67 y.o., female Today's Date: 12/07/2022  END OF SESSION:  PT End of Session - 12/07/22 1211     Visit Number 5    Number of Visits 12    Date for PT Re-Evaluation 12/07/22    PT Start Time 1211    PT Stop Time 1306    PT Time Calculation (min) 55 min    Activity Tolerance Patient tolerated treatment well    Behavior During Therapy WFL for tasks assessed/performed              Past Medical History:  Diagnosis Date   Allergic rhinitis    Anemia    pt states she has never heard that she had this   Anxiety    Constipation    Family history of adverse reaction to anesthesia    niece is hard to put to sleep and "sensitive to anesthesia"   GERD (gastroesophageal reflux disease)    Gestational diabetes    Headache(784.0)    Hypertension    IBS (irritable bowel syndrome)    Mitral valve prolapse    Neck pain    Neuromuscular disorder (HCC)    Pt reports takign FLU shot in the 90s which caused leg numbmess which has gone away, no tingling "from time to time"   Palpitations    Past Surgical History:  Procedure Laterality Date   BREAST LUMPECTOMY WITH RADIOACTIVE SEED AND SENTINEL LYMPH NODE BIOPSY Left 11/12/2021   Procedure: LEFT BREAST BRACKETED LUMPECTOMY WITH RADIOACTIVE SEED X2 AND SENTINEL LYMPH NODE BIOPSY;  Surgeon: Abigail Miyamoto, MD;  Location: [facility] OR;  Service: General;  Laterality: Left;  60 MIN ROOM 2   c section x 1     COLONOSCOPY  2006   Dr. Gloriajean Dell    LAPAROSCOPY     surgery to remove ovarian cyst     Patient Active Problem List   Diagnosis Date Noted   Malignant neoplasm of upper-inner quadrant of left breast in female, estrogen receptor positive (HCC) 11/03/2021   Mitral valve prolapse 12/03/2015   Anxiety 05/08/2015   Essential hypertension 05/08/2015   Genetic testing 11/23/2013   Family history of breast  cancer in first degree relative 08/09/2012   Vaginal atrophy 08/09/2012   Menopause syndrome 08/09/2012   Dyspepsia and other specified disorders of function of stomach 05/03/2012   History of neurological disorder 04/04/2012   Overweight 07/05/2011   Atrophic vaginitis 07/05/2011   Family history of breast cancer in mother 07/05/2011   Recurrent yeast infection 07/02/2011   ANEMIA 12/25/2008   CONSTIPATION 12/25/2008   NECK PAIN 12/25/2008   HEADACHE 12/25/2008   PALPITATIONS 12/25/2008   ANXIETY 03/24/2007   ALLERGIC RHINITIS 03/24/2007   GERD 03/24/2007   IRRITABLE BOWEL SYNDROME 03/24/2007   GESTATIONAL DIABETES 03/24/2007   MITRAL VALVE PROLAPSE, HX OF 03/24/2007      REFERRING PROVIDER: Lillard Anes, NP  REFERRING DIAG: Left Breast lymphedema  THERAPY DIAG:  Malignant neoplasm of upper-inner quadrant of left female breast, unspecified estrogen receptor status (HCC)  Lymphedema of breast  Stiffness of left shoulder, not elsewhere classified  ONSET DATE: September  Rationale for Evaluation and Treatment: Rehabilitation  SUBJECTIVE:  SUBJECTIVE STATEMENT:  I am feeling good. I am going to continue to do the MLD. I think I am doing better with it. I think swelling is much better. My breast was tight and achy and now feels pretty normal. I [facility]'t [facility] an appt at [facility] until [facility] 31 so I am using the bras that I got before the surgery.  EVAL: Pt with increased complaints of left breast swelling and tightness since September 2024.The swelling and tightness happened around the time she was taking the Cancer drugs.  She found an area under her left breast that she thinks may be a cord. She had  prior cording in her left UE. She has a compression bra  but feels like it is too loose so  she wears a sports bra that she likes better.  PERTINENT HISTORY:  Patient was diagnosed on 10/10/2021 with left grade 2 invasive ductal carcinoma breast cancer. It measures 1.2 cm and is located in the upper inner quadrant. It is ER/PR positive and HER2 negative with a Ki67 of 10%. 11/12/21- L breast lumpectomy and SLNB (0/9) . She has been having Sozo screens and had to wear a sleeve for 30 days. She is 0.1 below the yellow zone and she was  instructed to start wearing her sleeve again   PAIN:  Are you having pain? No, just discomfort  PRECAUTIONS: Left UE lymphedema risk  RED FLAGS: None   WEIGHT BEARING RESTRICTIONS: No  FALLS:  Has patient fallen in last 6 months? 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  HAND DOMINANCE: right   PRIOR LEVEL OF FUNCTION: Independent  PATIENT GOALS:  Decrease left breast swelling, check cording under breast   OBJECTIVE: Note: Objective measures were completed at Evaluation unless otherwise noted.  COGNITION: Overall cognitive status: Within functional limits for tasks assessed   PALPATION: Breast incision restricted with nodular area above incision, mildly tender  OBSERVATIONS / OTHER ASSESSMENTS: palpable cord under left breast running vertically, generalized left breast swelling greatest laterally, no significant enlarged pores but bra marks present  SENSATION: Light touch: Deficits     POSTURE: Forward head and rounded shoulders posture   UPPER EXTREMITY AROM/PROM:  A/PROM RIGHT   eval  RIGHT 11/29/2022  Shoulder extension 64   Shoulder flexion 141 157  Shoulder abduction 180 168  Shoulder internal rotation    Shoulder external rotation      (Blank rows = not tested)  A/PROM LEFT   eval LEFT 11/29/2022 LEFT 12/07/2022   Shoulder extension 70    Shoulder flexion 147 155 153  Shoulder abduction 165 169 168  Shoulder internal rotation     Shoulder external rotation       (Blank rows = not tested)  CERVICAL AROM: All within functional  limits:    UPPER EXTREMITY STRENGTH:   LYMPHEDEMA ASSESSMENTS:   SURGERY TYPE/DATE: Left Breast Lumpectomy with SLNB  NUMBER OF LYMPH NODES REMOVED: 0/9  CHEMOTHERAPY: NO  RADIATION:YES  HORMONE TREATMENT: Exemestane  INFECTIONS: NO   LYMPHEDEMA ASSESSMENTS:   LANDMARK RIGHT  eval  At axilla  26.7  15 cm proximal to olecranon process   10 cm proximal to olecranon process 24.0  Olecranon process 22.6  15 cm proximal to ulnar styloid process 22.7  10 cm proximal to ulnar styloid  process 20.45  Just proximal to ulnar styloid process 15.35  Across hand at thumb web space 19.1  At base of 2nd digit 6.5  (Blank rows = not tested)  LANDMARK LEFT  eval  At axilla  26.2  15 cm proximal to olecranon process   10 cm proximal to olecranon process 25.3  Olecranon process 23.55  15 cm proximal to ulnar styloid process 23.7  10 cm proximal to ulnar styloid process 21.0  Just proximal to ulnar styloid process 15.9  Across hand at thumb web space 18.8  At base of 2nd digit 6.8  (Blank rows = not tested)   FUNCTIONAL TESTS:    GAIT:WNL  L-DEX LYMPHEDEMA SCREENING: The patient was assessed using the L-Dex machine today to produce a lymphedema index baseline score. The patient will be reassessed on a regular basis (typically every 3 months) to obtain new L-Dex scores. If the score is > 6.5 points away from his/her baseline score indicating onset of subclinical lymphedema, it will be recommended to wear a compression garment for 4 weeks, 12 hours per day and then be reassessed. If the score continues to be > 6.5 points from baseline at reassessment, we will initiate lymphedema treatment. Assessing in this manner has a 95% rate of preventing clinically  significant lymphedema. 10/25/2022   6.4 above baseline making her on the borderline of green and yellow;Advised to start wearing her sleeve daily before we retest again  QUICK DASH SURVEY: NA  BREAST COMPLAINTS QUESTIONNAIRE Pain:2 Heaviness:7 Swollen feeling:8 Tense Skin:8 Redness:0 Bra Print:4 Size of Pores:3 Hard feeling: 6 Total:   38  /80 A Score over 9 indicates lymphedema issues in the breast BREAST COMPLAINTS QUESTIONNAIRE (12/07/2022) Pain:2 Heaviness:1 Swollen feeling:2 Tense Skin:2 Redness:0 Bra Print:2 Size of Pores:2 Hard feeling: 5 Total:  16   /80 A Score over 9 indicates lymphedema issues in the breast    TODAY'S TREATMENT:                                                                                                                                         Pt permission and consent throughout each step of examination and treatment with modification and draping if requested when working on sensitive areas  DATE:   12/07/2022 Wall slides flexion and abduction x 5 Completed breast complaints survey Measured AROM n supine: Short neck, 5 diaphragmatic breaths, R axillary nodes and establishment of interaxillary pathway, L inguinal nodes and establishment of axilloinguinal pathway, then L breast moving fluid towards pathways spending extra time in any areas of fibrosis and then repeated pathways and ended with LN's. Performed AI pathway in SL. Ptperformed all with intermittent VC's and TC's Performed SOZO screen; Well in the green at .1 11/29/2022 Measured AROM B MFR to left axillary region of cording n supine: Short neck, 5 diaphragmatic breaths, R axillary nodes and establishment of interaxillary pathway, L inguinal nodes  and establishment of axilloinguinal pathway, then L breast moving fluid towards pathways spending extra time in any areas of fibrosis then retracing all steps. Pt instructed in anatomy and was shown pictures of pathways. Therapist performed  all aspects and then had pt practice same technqiue. Practiced axillo-inguinal pathway in supine and SL and found SL the easiest to reach 11/15/2022 Wall slides for abduction x4 on left In supine: Short neck, 5 diaphragmatic breaths, R axillary nodes and establishment of interaxillary pathway, L inguinal nodes and establishment of axilloinguinal pathway, then L breast moving fluid towards pathways spending extra time in any areas of fibrosis then retracing all steps. Pt instructed in anatomy and was shown pictures of pathways. Therapist performed all aspects and then had pt practice same technqiue. Practiced axillo-inguinal pathway in supine and SL and found SL the easiest to reach MFR to left axillary region of cording. No cording palpated under breast today Pt given script to go to [facility] to get a compression bra, also gave illustrated handout for MLD  11/04/22 With pt permission for breast MLD Education on lymphatic anatomy and drainage patterns of the breast and principles of MLD Performed each step in supine per instruction section below with PT reading and then performing each step and then with hand over hand cueing and performance by pt with modification as needed - noted below.  MFR and STM with cocoa butter to the Lt axilla with arm propped overhead.   Discussed POC, visit number, MLD to breast, MFR to cording    PATIENT EDUCATION:  Education details: POC, visit number, MLD to breast, MFR to cording, SOZO screen and start wearing sleeve again Person educated: Patient Education method: Explanation Education comprehension: verbalized understanding  HOME EXERCISE PROGRAM:   ASSESSMENT:  CLINICAL IMPRESSION:  Pt has achieved 4/6 Goals established at initial evaluation. She missed achieving the breast complaints survey by 1 point, and her left shoulder ROM is still mildly limited. She will continue to work on shoulder ROM at home. She has been compliant with her self  MLD to the left breast, and wearing a compression bra. She was given the name of the new compression bra, Wear Ease Kasandra Knudsen and she will be fit for a new bra at the end of this month. She will continue her SOZO screens every 3 months, and does see the benefit of wearing her compression sleeve. She feels ready to be discharged. OBJECTIVE IMPAIRMENTS: decreased knowledge of condition, decreased ROM, increased edema, increased fascial restrictions, and postural dysfunction.   ACTIVITY LIMITATIONS: reach over head  PARTICIPATION LIMITATIONS:  able to do all but with breast swelling  PERSONAL FACTORS: 1-2 comorbidities: left breast cancer s/p radiation  are also affecting patient's functional outcome.   REHAB POTENTIAL: Good  CLINICAL DECISION MAKING: Stable/uncomplicated  EVALUATION COMPLEXITY: Low  GOALS: Goals reviewed with patient? Yes  SHORT TERM GOALS=LONG TERM GOALS: Target date: 12/06/2022  Pt will be compliant with compression bra/sports bra to decrease swelling Baseline: Goal status: MET 12/07/2022  2.  Pt will be independent with MLD to decrease left breast swelling Baseline:  Goal status:MET 12/07/2022 3.  Pt will improve left shoulder abduction to atleast 175 degrees  Baseline;165  Goal status: NOT MET 12/07/2022 4.  Pt will have decreased breast swelling by 50% Baseline:  Goal status: MET 11/2022  5.  Breast Complaints survey will decrease to 15 to demonstrate improvement in swelling Baseline:  Goal status: NOT MET, 16 today 6.  Pt will be independent with  HEP to improve Shoulder ROM Baseline:  Goal status: MET 11/29/2022  PLAN:  PT FREQUENCY: 2x/week  PT DURATION: 6 weeks  PLANNED INTERVENTIONS: 97164- PT Re-evaluation, 97110-Therapeutic exercises, 97535- Self Care, 31517- Manual therapy, 97760- Orthotic Fit/training, Patient/Family education, Balance training, Joint mobilization, Manual lymph drainage, Scar mobilization, Therapeutic exercises, Therapeutic  activity, Neuromuscular re-education, Gait training, and Self Care  PLAN FOR NEXT SESSION: Pt is discharged to continue with independent self management PHYSICAL THERAPY DISCHARGE SUMMARY  Visits from [facility]: 5  Current functional level related to goals / functional outcomes: Achieved 4 out of 6 goals   Remaining deficits: Shoulder ROM goals not achieved. Still lacking some end range motion, Breast complaints survey score today 16, with goal of 15, so nearly achieved   Education / Equipment: HEP,   Patient agrees to discharge. Patient goals were partially met. Patient is being discharged due to being pleased with the current functional level.  Waynette Buttery, PT 12/07/2022, 1:08 PM   Manual Lymph Drainage for Left Breast.   Do daily.  Do slowly. Use flat hands with just enough pressure to stretch the skin. Do not slide over the skin, stretch the skin with the hand. (Stretch  Relax  Move) Lie down or sit comfortably (in a recliner, for example) to do this.   Do circles at each collar bone near the neck 5-7 times (to "wake up" lots of lymph nodes in this area).  Take slow deep breaths, allowing your belly to balloon out as you breathe in, 5x (to "wake up" abdominal lymph nodes).  Do circles in both armpits--stretch skin in small circles to stimulate intact lymph nodes there, 5-7x.  Do Circles in the left groin area, at panty line--stretch skin to stimulate lymph nodes 5-7x.  Redirect fluid from left chest toward right armpit (stretch skin starting at left chest in 3-4 spots working toward right armpit) 3-4x across the chest.  Redirect fluid from left armpit toward left groin (cup your hand around the curve of your left side and do 3-4 "pumps" from armpit to groin) 3-4x down your side.  Draw an imaginary diagonal line from upper outer breast through the nipple area toward lower inner breast.  Direct fluid above this line upward and inward toward the pathway across your  chest (established in #5).  Then direct the fluid below this line down and out toward the pathway down your side going towards the left groin (established in #6). Do this for a few minutes or until you feel any improvements.   Then repeat your pathways 2-3x  (#5 and #6)  End with repeating #3 and #4 above  (circles in both armpits and the left groin)

## 2022-12-18 ENCOUNTER — Other Ambulatory Visit: Payer: Self-pay | Admitting: Adult Health

## 2022-12-18 DIAGNOSIS — Z170 Estrogen receptor positive status [ER+]: Secondary | ICD-10-CM

## 2023-01-04 ENCOUNTER — Encounter: Payer: Self-pay | Admitting: Family Medicine

## 2023-01-12 DIAGNOSIS — J069 Acute upper respiratory infection, unspecified: Secondary | ICD-10-CM | POA: Diagnosis not present

## 2023-01-12 DIAGNOSIS — I10 Essential (primary) hypertension: Secondary | ICD-10-CM | POA: Diagnosis not present

## 2023-01-17 ENCOUNTER — Ambulatory Visit: Payer: Medicare Other

## 2023-01-29 ENCOUNTER — Ambulatory Visit (INDEPENDENT_AMBULATORY_CARE_PROVIDER_SITE_OTHER): Payer: Medicare Other

## 2023-01-29 ENCOUNTER — Ambulatory Visit
Admission: EM | Admit: 2023-01-29 | Discharge: 2023-01-29 | Disposition: A | Discharge status: Home / Self Care | Payer: Medicare Other | Attending: Family Medicine | Admitting: Family Medicine

## 2023-01-29 DIAGNOSIS — R051 Acute cough: Secondary | ICD-10-CM

## 2023-01-29 DIAGNOSIS — R059 Cough, unspecified: Secondary | ICD-10-CM | POA: Diagnosis not present

## 2023-01-29 DIAGNOSIS — R509 Fever, unspecified: Secondary | ICD-10-CM

## 2023-01-29 LAB — POC COVID19/FLU A&B COMBO
Covid Antigen, POC: NEGATIVE
Influenza A Antigen, POC: NEGATIVE
Influenza B Antigen, POC: NEGATIVE

## 2023-01-29 LAB — POCT RAPID STREP A (OFFICE): Rapid Strep A Screen: NEGATIVE

## 2023-01-29 MED ORDER — HYDROCODONE BIT-HOMATROP MBR 5-1.5 MG/5ML PO SOLN: 2.5000 mL | Freq: Four times a day (QID) | ORAL | 0 refills | Status: DC | PRN

## 2023-01-29 NOTE — ED Triage Notes (Signed)
Patient presents with chills, cough, runny, sore and scratchy throat. Patient states she has had a cough and congestion x 1 month. Treated with otc Decongestants and Tylenol with minimal relief.

## 2023-02-02 NOTE — ED Provider Notes (Signed)
[facility]   [id number] 01/29/23 Arrival Time: 1230  ASSESSMENT & PLAN:  1. Acute cough   2. Subjective fever    I have personally viewed and independently interpreted the imaging studies ordered this visit. CXR: no acute changes.  Discussed typical duration of likely viral illness. Results for orders placed or performed during the hospital encounter of 01/29/23  POCT rapid strep A   Collection Time: 01/29/23  1:21 PM  Result Value Ref Range   Rapid Strep A Screen Negative   POC Covid19/Flu A&B Antigen   Collection Time: 01/29/23  1:31 PM  Result Value Ref Range   Influenza A Antigen, POC Negative    Influenza B Antigen, POC Negative    Covid Antigen, POC Negative    OTC symptom care as needed.  Discharge Medication List as of 01/29/2023  3:39 PM     START taking these medications   Details  HYDROcodone bit-homatropine (HYCODAN) 5-1.5 MG/5ML syrup Take 2.5-5 mLs by mouth every 6 (six) hours as needed for cough., Starting Sat 01/29/2023, Normal         Follow-up Information     [facility].   Why: As needed. Contact information: [address], [address] [place] [address] [phone]                 Reviewed expectations re: course of current medical issues. Questions answered. Outlined signs and symptoms indicating need for more acute intervention. Understanding verbalized. After Visit Summary given.   SUBJECTIVE: History from: Patient. Janice Gross is a 68 y.o. female. Patient presents with chills, cough, runny, sore and scratchy throat. Patient states she has had a cough and congestion x 1 month. Treated with otc Decongestants and Tylenol with minimal relief.  Normal PO intake without n/v/d.  OBJECTIVE:  Vitals:   01/29/23 1301 01/29/23 1302  BP:  (!) 181/89  Pulse:  60  Resp:  18  Temp:  98.4 F (36.9 C)  TempSrc:  Oral  SpO2:  100%  Weight: 65.8 kg   Height: 5\' 1"  (1.549 m)      General appearance: alert; no distress Eyes: PERRLA; EOMI; conjunctiva normal HENT: [place]; AT; with nasal congestion Neck: supple  Lungs: speaks full sentences without difficulty; unlabored; slightly coarse breath sounds bilat Extremities: no edema Skin: warm and dry Neurologic: normal gait Psychological: alert and cooperative; normal mood and affect  Labs: Results for orders placed or performed during the hospital encounter of 01/29/23  POCT rapid strep A   Collection Time: 01/29/23  1:21 PM  Result Value Ref Range   Rapid Strep A Screen Negative   POC Covid19/Flu A&B Antigen   Collection Time: 01/29/23  1:31 PM  Result Value Ref Range   Influenza A Antigen, POC Negative    Influenza B Antigen, POC Negative    Covid Antigen, POC Negative    Labs Reviewed  POCT RAPID STREP A (OFFICE) - Normal  POC COVID19/FLU A&B COMBO - Normal    Imaging: DG Chest 2 View Result Date: 01/29/2023 CLINICAL DATA:  Cough, fever, and chills for 1 month. EXAM: CHEST - 2 VIEW COMPARISON:  10/11/2022 FINDINGS: The heart size and mediastinal contours are within normal limits. Both lungs are clear. Surgical clips noted in left breast and axilla. IMPRESSION: No active cardiopulmonary disease. Electronically Signed   By: Danae Orleans M.D.   On: 01/29/2023 15:41    Allergies  Allergen Reactions   Ace Inhibitors Cough   Aspirin  Other (See Comments)    Makes her lower back hurt   Doxycycline Nausea And Vomiting   Fluogen [Influenza Virus Vaccine] Other (See Comments)    Caused leg numbness and tingling in the 90s after flu shot   Hydrochlorothiazide     Other Reaction(s): hair loss   Iodine Other (See Comments)    shaking   Other Nausea And Vomiting    "Strong pain medications" cause nausea and vomiting and constipation    Past Medical History:  Diagnosis Date   Allergic rhinitis    Anemia    pt states she has never heard that she had this   Anxiety    Constipation    Family history of adverse  reaction to anesthesia    niece is hard to put to sleep and "sensitive to anesthesia"   GERD (gastroesophageal reflux disease)    Gestational diabetes    Headache(784.0)    Hypertension    IBS (irritable bowel syndrome)    Mitral valve prolapse    Neck pain    Neuromuscular disorder (HCC)    Pt reports takign FLU shot in the 90s which caused leg numbmess which has gone away, no tingling "from time to time"   Palpitations    Social History   Socioeconomic History   Marital status: Divorced    Spouse name: Not on file   Number of children: 1   Years of education: Not on file   Highest education level: Not on file  Occupational History   Occupation: Biochemist, clinical: [employer]  Tobacco Use   Smoking status: Never   Smokeless tobacco: Never   Tobacco comments:    tried as a teenager  Vaping Use   Vaping status: Never Used  Substance and Sexual Activity   Alcohol use: No    Alcohol/week: 0.0 standard drinks of alcohol   Drug use: No   Sexual activity: Not Currently    Birth control/protection: None  Other Topics Concern   Not on file  Social History Narrative   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 - reports loves her job      Home Situation: lives alone      Spiritual Beliefs: Christian      Lifestyle:walking on a regular basis; diet is fair         Social Drivers of Corporate investment banker Strain: Low Risk  (11/04/2021)   Overall Financial Resource Strain (CARDIA)    Difficulty of Paying Living Expenses: Not very hard  Food Insecurity: No Food Insecurity (11/04/2021)   Hunger Vital Sign    Worried About Running Out of Food in the Last Year: Never true    Ran Out of Food in the Last Year: Never true  Transportation Needs: No Transportation Needs (11/04/2021)   PRAPARE - Administrator, Civil Service (Medical): No    Lack of Transportation (Non-Medical): No  Physical Activity: Not on  file  Stress: Not on file  Social Connections: Not on file  Intimate Partner Violence: Not on file   Family History  Problem Relation Age of Onset   Breast cancer Sister 52   Diabetes Sister    Breast cancer Sister 83   Diabetes Sister    Breast cancer Mother    Ovarian cancer Sister 28   Heart disease Father    Diabetes Brother    Lung cancer Brother    Breast cancer  Other        dx in her 50s/dx in her 42s   Colon cancer Other 23   Lung cancer Other        dx in his 46s   Stomach cancer Neg Hx    Past Surgical History:  Procedure Laterality Date   BREAST LUMPECTOMY WITH RADIOACTIVE SEED AND SENTINEL LYMPH NODE BIOPSY Left 11/12/2021   Procedure: LEFT BREAST BRACKETED LUMPECTOMY WITH RADIOACTIVE SEED X2 AND SENTINEL LYMPH NODE BIOPSY;  Surgeon: Abigail Miyamoto, MD;  Location: [facility] OR;  Service: General;  Laterality: Left;  60 MIN ROOM 2   c section x 1     COLONOSCOPY  2006   Dr. Gloriajean Dell    LAPAROSCOPY     surgery to remove ovarian cyst       Mardella Layman, MD 02/02/23 1201

## 2023-02-28 ENCOUNTER — Telehealth: Payer: Self-pay

## 2023-02-28 ENCOUNTER — Ambulatory Visit: Payer: Medicare Other | Admitting: Hematology and Oncology

## 2023-02-28 NOTE — Telephone Encounter (Signed)
 Spoke with patient and she stated she needed to reschedule the appointment.Marland Kitchen gave her [phone] to call and reschedule visit.Marland Kitchen Also sent message to Leonette Most to let her know to reschedule

## 2023-03-01 ENCOUNTER — Ambulatory Visit: Payer: Medicare Other | Admitting: Hematology and Oncology

## 2023-03-14 ENCOUNTER — Ambulatory Visit: Payer: Medicare Other

## 2023-03-17 ENCOUNTER — Inpatient Hospital Stay: Payer: Medicare Other | Attending: Hematology and Oncology | Admitting: Hematology and Oncology

## 2023-03-17 VITALS — BP 157/77 | HR 58 | Temp 97.9°F | Resp 16 | Wt 148.8 lb

## 2023-03-17 DIAGNOSIS — C50212 Malignant neoplasm of upper-inner quadrant of left female breast: Secondary | ICD-10-CM | POA: Diagnosis not present

## 2023-03-17 DIAGNOSIS — R102 Pelvic and perineal pain: Secondary | ICD-10-CM

## 2023-03-17 DIAGNOSIS — Z170 Estrogen receptor positive status [ER+]: Secondary | ICD-10-CM | POA: Diagnosis not present

## 2023-03-17 DIAGNOSIS — Z79811 Long term (current) use of aromatase inhibitors: Secondary | ICD-10-CM | POA: Insufficient documentation

## 2023-03-17 MED ORDER — LETROZOLE 2.5 MG PO TABS: 2.5000 mg | ORAL_TABLET | Freq: Every day | ORAL | 3 refills | Status: AC

## 2023-03-17 NOTE — Progress Notes (Signed)
 [facility] Cancer Follow up:    [facility] [address], [address] [place] [address]   DIAGNOSIS:  Cancer Staging  Malignant neoplasm of upper-inner quadrant of left breast in female, estrogen receptor positive (HCC) Staging form: Breast, AJCC 8th Edition - Clinical stage from 11/04/2021: Stage IA (cT1c, cN0, cM0, G2, ER+, PR+, HER2-) - Signed by Ronny Bacon, PA-C on 11/04/2021 Stage prefix: Initial diagnosis Method of lymph node assessment: Clinical Histologic grading system: 3 grade system - Pathologic: Stage IA (pT2, pN0(sn), cM0, G2, ER+, PR+, HER2-, Oncotype DX score: 19) - Signed by Ronny Bacon, PA-C on 12/21/2021 Stage prefix: Initial diagnosis Method of lymph node assessment: Sentinel lymph node biopsy Multigene prognostic tests performed: Oncotype DX Recurrence score range: Greater than or equal to 11 Histologic grading system: 3 grade system   SUMMARY OF ONCOLOGIC HISTORY: Oncology History  Malignant neoplasm of upper-inner quadrant of left breast in female, estrogen receptor positive (HCC)  10/10/2021 Mammogram   Screening mammogram showed loosely grouped calcifications in the right breast at 3:00 posterior depth, indeterminate, additional views are recommended.  Asymmetry in the left breast posterior depth superior region seen on the mediolateral oblique view only is indeterminate.  Diagnostic mammogram showed a 1 cm irregular mass with calcifications in the left breast, ultrasound was recommended.  Ultrasound confirmed a 1.2 x 0.5 cm irregular mass suspicious for malignancy.  No significant abnormalities in the axilla   10/29/2021 Pathology Results   Pathology results from the left breast biopsy showed invasive ductal carcinoma overall grade 2, prognostic showed ER 95% positive strong staining PR 40% positive strong staining, Ki-67 of 10% tumor cells are negative for HER2 1+ by [facility]   11/04/2021 Cancer  Staging   Staging form: Breast, AJCC 8th Edition - Clinical stage from 11/04/2021: Stage IA (cT1c, cN0, cM0, G2, ER+, PR+, HER2-) - Signed by Ronny Bacon, PA-C on 11/04/2021 Stage prefix: Initial diagnosis Method of lymph node assessment: Clinical Histologic grading system: 3 grade system   11/12/2021 Surgery   Left breast lumpectomy: IDC, g2, 3.6cm, margins neg, 9 SLN negative, T2, N0   11/12/2021 Oncotype testing   19/6%   12/21/2021 Cancer Staging   Staging form: Breast, AJCC 8th Edition - Pathologic: Stage IA (pT2, pN0(sn), cM0, G2, ER+, PR+, HER2-, Oncotype DX score: 19) - Signed by Ronny Bacon, PA-C on 12/21/2021 Stage prefix: Initial diagnosis Method of lymph node assessment: Sentinel lymph node biopsy Multigene prognostic tests performed: Oncotype DX Recurrence score range: Greater than or equal to 11 Histologic grading system: 3 grade system   01/05/2022 - 02/01/2022 Radiation Therapy   Site Technique Total Dose (Gy) Dose per Fx (Gy) Completed Fx Beam Energies  Breast, Left: Breast_L 3D 42.56/42.56 2.66 16/16 10XFFF  Breast, Left: Breast_L_Bst 3D 8/8 2 4/4 6X, 10X     02/2022 -  Anti-estrogen oral therapy   Anastrozole     CURRENT THERAPY:  INTERVAL HISTORY:  Discussed the use of AI scribe software for clinical note transcription with the patient, who gave verbal consent to proceed.  Janice Gross 68 y.o. female,  with a history of breast cancer while on tamoxifen.  The patient, with polycystic ovarian syndrome, presents with pelvic pain and bloating associated with tamoxifen use.  She experiences significant pelvic pain and bloating, describing it as feeling inflamed and swollen, particularly when taking tamoxifen. The pain is severe, similar to menstrual cycle pain, and intensifies with two pills of  tamoxifen. She finds the pain more tolerable with a single pill, indicating better tolerance to a lower dose. She has a history of polycystic  ovarian syndrome, causing irregular menstrual cycles in the past. No history of fibroids or endometriosis. She still has her uterus and has not undergone any surgeries related to these conditions.  She has previously tried anastrozole, which resulted in consistently elevated blood pressure, making it unsuitable. Exemestane (Aromasin) was also tried but gave her some side effects. Letrozole has not yet been tried.  In addition to pelvic pain, she reports a clear, minimal vaginal discharge but denies any heavy discharge or bleeding.  She mentions experiencing leg swelling, attributed to a past snake bite on both legs, with more venom affecting the left leg. This swelling tends to occur during the summer months.  She describes a sensation of a lump near the site of a previous surgery, which becomes more noticeable during exercise. She is concerned about this but has not experienced any breast surgeries in the past.  Patient Active Problem List   Diagnosis Date Noted   Malignant neoplasm of upper-inner quadrant of left breast in female, estrogen receptor positive (HCC) 11/03/2021   Mitral valve prolapse 12/03/2015   Anxiety 05/08/2015   Essential hypertension 05/08/2015   Genetic testing 11/23/2013   Family history of breast cancer in first degree relative 08/09/2012   Vaginal atrophy 08/09/2012   Menopause syndrome 08/09/2012   Dyspepsia and other specified disorders of function of stomach 05/03/2012   History of neurological disorder 04/04/2012   Overweight 07/05/2011   Atrophic vaginitis 07/05/2011   Family history of breast cancer in mother 07/05/2011   Recurrent yeast infection 07/02/2011   ANEMIA 12/25/2008   CONSTIPATION 12/25/2008   NECK PAIN 12/25/2008   HEADACHE 12/25/2008   PALPITATIONS 12/25/2008   ANXIETY 03/24/2007   ALLERGIC RHINITIS 03/24/2007   GERD 03/24/2007   IRRITABLE BOWEL SYNDROME 03/24/2007   GESTATIONAL DIABETES 03/24/2007   MITRAL VALVE PROLAPSE, HX OF  03/24/2007    is allergic to ace inhibitors, aspirin, doxycycline, fluogen [influenza virus vaccine], hydrochlorothiazide, iodine, and other.  MEDICAL HISTORY: Past Medical History:  Diagnosis Date   Allergic rhinitis    Anemia    pt states she has never heard that she had this   Anxiety    Constipation    Family history of adverse reaction to anesthesia    niece is hard to put to sleep and "sensitive to anesthesia"   GERD (gastroesophageal reflux disease)    Gestational diabetes    Headache(784.0)    Hypertension    IBS (irritable bowel syndrome)    Mitral valve prolapse    Neck pain    Neuromuscular disorder (HCC)    Pt reports takign FLU shot in the 90s which caused leg numbmess which has gone away, no tingling "from time to time"   Palpitations     SURGICAL HISTORY: Past Surgical History:  Procedure Laterality Date   BREAST LUMPECTOMY WITH RADIOACTIVE SEED AND SENTINEL LYMPH NODE BIOPSY Left 11/12/2021   Procedure: LEFT BREAST BRACKETED LUMPECTOMY WITH RADIOACTIVE SEED X2 AND SENTINEL LYMPH NODE BIOPSY;  Surgeon: Abigail Miyamoto, MD;  Location: [facility] OR;  Service: General;  Laterality: Left;  60 MIN ROOM 2   c section x 1     COLONOSCOPY  2006   Dr. Gloriajean Dell    LAPAROSCOPY     surgery to remove ovarian cyst      SOCIAL HISTORY: Social History   Socioeconomic History  Marital status: Divorced    Spouse name: Not on file   Number of children: 1   Years of education: Not on file   Highest education level: Not on file  Occupational History   Occupation: Biochemist, clinical: [employer]  Tobacco Use   Smoking status: Never   Smokeless tobacco: Never   Tobacco comments:    tried as a teenager  Vaping Use   Vaping status: Never Used  Substance and Sexual Activity   Alcohol use: No    Alcohol/week: 0.0 standard drinks of alcohol   Drug use: No   Sexual activity: Not Currently    Birth control/protection: None  Other Topics Concern   Not on file   Social History Narrative   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 - reports loves her job      Home Situation: lives alone      Spiritual Beliefs: Christian      Lifestyle:walking on a regular basis; diet is fair         Social Drivers of Corporate investment banker Strain: Low Risk  (11/04/2021)   Overall Financial Resource Strain (CARDIA)    Difficulty of Paying Living Expenses: Not very hard  Food Insecurity: No Food Insecurity (11/04/2021)   Hunger Vital Sign    Worried About Running Out of Food in the Last Year: Never true    Ran Out of Food in the Last Year: Never true  Transportation Needs: No Transportation Needs (11/04/2021)   PRAPARE - Administrator, Civil Service (Medical): No    Lack of Transportation (Non-Medical): No  Physical Activity: Not on file  Stress: Not on file  Social Connections: Not on file  Intimate Partner Violence: Not on file    FAMILY HISTORY: Family History  Problem Relation Age of Onset   Breast cancer Sister 91   Diabetes Sister    Breast cancer Sister 49   Diabetes Sister    Breast cancer Mother    Ovarian cancer Sister 28   Heart disease Father    Diabetes Brother    Lung cancer Brother    Breast cancer Other        dx in her 50s/dx in her 88s   Colon cancer Other 23   Lung cancer Other        dx in his 39s   Stomach cancer Neg Hx     Review of Systems  Constitutional:  Negative for appetite change, chills, fatigue, fever and unexpected weight change.  HENT:   Negative for hearing loss, lump/mass and trouble swallowing.   Eyes:  Negative for eye problems and icterus.  Respiratory:  Negative for chest tightness, cough and shortness of breath.   Cardiovascular:  Negative for chest pain, leg swelling and palpitations.  Gastrointestinal:  Negative for abdominal distention, abdominal pain, constipation, diarrhea, nausea and vomiting.  Endocrine: Negative for hot flashes.   Genitourinary:  Negative for difficulty urinating.   Musculoskeletal:  Negative for arthralgias.  Skin:  Negative for itching and rash.  Neurological:  Negative for dizziness, extremity weakness, headaches and numbness.  Hematological:  Negative for adenopathy. Does not bruise/bleed easily.  Psychiatric/Behavioral:  Negative for depression. The patient is not nervous/anxious.       PHYSICAL EXAMINATION    Vitals:   03/17/23 1040  BP: (!) 157/77  Pulse: (!) 58  Resp: 16  Temp: 97.9 F (36.6 C)  SpO2:  100%    General: Alert, oriented and in no acute distress BREAST: Small seroma next to scar, soft, cystic, mobile. This is likely a seroma No palpable masses No regional adenopathy No LE edema  ASSESSMENT and THERAPY PLAN:   Malignant neoplasm of upper-inner quadrant of left breast in female, estrogen receptor positive (HCC) Pelvic pain Severe pelvic pain and bloating associated with tamoxifen use. Differential includes tamoxifen-induced uterine changes. Ultrasound needed to evaluate uterine and ovarian causes. - Order pelvic ultrasound to assess uterine and ovarian causes, including tamoxifen-induced uterine thickening. - Schedule pelvic ultrasound within two weeks.  Tamoxifen intolerance Significant side effects from tamoxifen, better tolerance at half dose. Letrozole considered due to potentially better side effect profile. - Prescribe letrozole, discontinue tamoxifen during letrozole trial. - Discuss letrozole side effects:  hot flashes, aches, dryness, bone density loss; potentially better tolerated than anastrozole. - Reassess letrozole response in 1-2 months via phone.  Seroma Small, mobile, non-cancerous seroma at previous surgery site, requires no intervention. - Reassured seroma is non-cancerous and requires no intervention. -Repeat mammogram in 1 yr from the last one.  Baseline bone density normal, encouraged weight bearing exercises, calcium and vit D  supplementation.  Rachel Moulds MD   All questions were answered. The patient knows to call the clinic with any problems, questions or concerns. We can certainly see the patient much sooner if necessary.  *Total Encounter Time as defined by the Centers for Medicare and Medicaid Services includes, in addition to the face-to-face time of a patient visit (documented in the note above) non-face-to-face time: obtaining and reviewing outside history, ordering and reviewing medications, tests or procedures, care coordination (communications with other health care professionals or caregivers) and documentation in the medical record.

## 2023-03-17 NOTE — Assessment & Plan Note (Signed)
 Pelvic pain Severe pelvic pain and bloating associated with tamoxifen use. Differential includes tamoxifen-induced uterine changes. Ultrasound needed to evaluate uterine and ovarian causes. - Order pelvic ultrasound to assess uterine and ovarian causes, including tamoxifen-induced uterine thickening. - Schedule pelvic ultrasound within two weeks.  Tamoxifen intolerance Significant side effects from tamoxifen, better tolerance at half dose. Letrozole considered due to potentially better side effect profile. - Prescribe letrozole, discontinue tamoxifen during letrozole trial. - Discuss letrozole side effects:  hot flashes, aches, dryness, bone density loss; potentially better tolerated than anastrozole. - Reassess letrozole response in 1-2 months via phone.  Seroma Small, mobile, non-cancerous seroma at previous surgery site, requires no intervention. - Reassured seroma is non-cancerous and requires no intervention. -Repeat mammogram in 1 yr from the last one.  Baseline bone density normal, encouraged weight bearing exercises, calcium and vit D supplementation.  Rachel Moulds MD

## 2023-04-01 DIAGNOSIS — I10 Essential (primary) hypertension: Secondary | ICD-10-CM | POA: Diagnosis not present

## 2023-04-04 ENCOUNTER — Ambulatory Visit (HOSPITAL_COMMUNITY)

## 2023-04-05 DIAGNOSIS — I10 Essential (primary) hypertension: Secondary | ICD-10-CM | POA: Diagnosis not present

## 2023-04-05 DIAGNOSIS — Z0000 Encounter for general adult medical examination without abnormal findings: Secondary | ICD-10-CM | POA: Diagnosis not present

## 2023-04-05 DIAGNOSIS — C50212 Malignant neoplasm of upper-inner quadrant of left female breast: Secondary | ICD-10-CM | POA: Diagnosis not present

## 2023-04-05 DIAGNOSIS — E876 Hypokalemia: Secondary | ICD-10-CM | POA: Diagnosis not present

## 2023-04-05 DIAGNOSIS — Z1211 Encounter for screening for malignant neoplasm of colon: Secondary | ICD-10-CM | POA: Diagnosis not present

## 2023-04-05 DIAGNOSIS — K5900 Constipation, unspecified: Secondary | ICD-10-CM | POA: Diagnosis not present

## 2023-04-05 DIAGNOSIS — Z8669 Personal history of other diseases of the nervous system and sense organs: Secondary | ICD-10-CM | POA: Diagnosis not present

## 2023-04-05 DIAGNOSIS — E7800 Pure hypercholesterolemia, unspecified: Secondary | ICD-10-CM | POA: Diagnosis not present

## 2023-04-06 ENCOUNTER — Ambulatory Visit (HOSPITAL_COMMUNITY)
Admission: RE | Admit: 2023-04-06 | Discharge: 2023-04-06 | Disposition: A | Discharge status: Home / Self Care | Source: Ambulatory Visit | Attending: Hematology and Oncology | Admitting: Hematology and Oncology

## 2023-04-06 DIAGNOSIS — R102 Pelvic and perineal pain: Secondary | ICD-10-CM | POA: Diagnosis present

## 2023-04-07 ENCOUNTER — Telehealth: Payer: Self-pay

## 2023-04-07 NOTE — Telephone Encounter (Signed)
Attempted to call pt to discuss results. No answer. LVM for call back.

## 2023-04-07 NOTE — Telephone Encounter (Signed)
-----   Message from [place] sent at 04/07/2023  1:09 PM EDT ----- Please discuss the results of the [place] with the patient.

## 2023-05-02 ENCOUNTER — Ambulatory Visit: Attending: Surgery

## 2023-05-02 VITALS — Wt 148.4 lb

## 2023-05-02 DIAGNOSIS — Z483 Aftercare following surgery for neoplasm: Secondary | ICD-10-CM

## 2023-05-02 NOTE — Therapy (Signed)
 OUTPATIENT PHYSICAL THERAPY SOZO SCREENING NOTE   Patient Name: Janice Gross MRN: [id number] DOB:11/29/1955, 68 y.o., female Today's Date: 05/02/2023  PCP: [facility] REFERRING PROVIDER: Oza Blumenthal, MD   PT End of Session - 05/02/23 1601     Visit Number 5   # unchnaged due to screen only   PT Start Time 1558    PT Stop Time 1602    PT Time Calculation (min) 4 min    Activity Tolerance Patient tolerated treatment well    Behavior During Therapy WFL for tasks assessed/performed             Past Medical History:  Diagnosis Date   Allergic rhinitis    Anemia    pt states she has never heard that she had this   Anxiety    Constipation    Family history of adverse reaction to anesthesia    niece is hard to put to sleep and "sensitive to anesthesia"   GERD (gastroesophageal reflux disease)    Gestational diabetes    Headache(784.0)    Hypertension    IBS (irritable bowel syndrome)    Mitral valve prolapse    Neck pain    Neuromuscular disorder (HCC)    Pt reports takign FLU shot in the 90s which caused leg numbmess which has gone away, no tingling "from time to time"   Palpitations    Past Surgical History:  Procedure Laterality Date   BREAST LUMPECTOMY WITH RADIOACTIVE SEED AND SENTINEL LYMPH NODE BIOPSY Left 11/12/2021   Procedure: LEFT BREAST BRACKETED LUMPECTOMY WITH RADIOACTIVE SEED X2 AND SENTINEL LYMPH NODE BIOPSY;  Surgeon: Oza Blumenthal, MD;  Location: [facility] OR;  Service: General;  Laterality: Left;  60 MIN ROOM 2   c section x 1     COLONOSCOPY  2006   Dr. Frankey Isle    LAPAROSCOPY     surgery to remove ovarian cyst     Patient Active Problem List   Diagnosis Date Noted   Malignant neoplasm of upper-inner quadrant of left breast in female, estrogen receptor positive (HCC) 11/03/2021   Mitral valve prolapse 12/03/2015   Anxiety 05/08/2015   Essential hypertension 05/08/2015   Genetic testing 11/23/2013   Family  history of breast cancer in first degree relative 08/09/2012   Vaginal atrophy 08/09/2012   Menopause syndrome 08/09/2012   Dyspepsia and other specified disorders of function of stomach 05/03/2012   History of neurological disorder 04/04/2012   Overweight 07/05/2011   Atrophic vaginitis 07/05/2011   Family history of breast cancer in mother 07/05/2011   Recurrent yeast infection 07/02/2011   ANEMIA 12/25/2008   CONSTIPATION 12/25/2008   NECK PAIN 12/25/2008   HEADACHE 12/25/2008   PALPITATIONS 12/25/2008   ANXIETY 03/24/2007   ALLERGIC RHINITIS 03/24/2007   GERD 03/24/2007   IRRITABLE BOWEL SYNDROME 03/24/2007   GESTATIONAL DIABETES 03/24/2007   MITRAL VALVE PROLAPSE, HX OF 03/24/2007    REFERRING DIAG: left breast cancer at risk for lymphedema  THERAPY DIAG:  Aftercare following surgery for neoplasm  PERTINENT HISTORY: Patient was diagnosed on 10/10/2021 with left grade 2 invasive ductal carcinoma breast cancer. It measures 1.2 cm and is located in the upper inner quadrant. It is ER/PR positive and HER2 negative with a Ki67 of 10%. 11/12/21- L breast lumpectomy and SLNB (0/9) . She has been having Sozo screens and had to wear a sleeve for 30 days. She is 0.1 below the yellow zone and she was  instructed to start wearing  her sleeve again   PRECAUTIONS: left UE Lymphedema risk, None  SUBJECTIVE: Pt returns for her 3 month L-Dex screen. "I still wear my sleeve every day."  PAIN:  Are you having pain? No  SOZO SCREENING: Patient was assessed today using the SOZO machine to determine the lymphedema index score. This was compared to her baseline score. It was determined that she is within the recommended range when compared to her baseline and no further action is needed at this time. She will continue SOZO screenings. These are done every 3 months for 2 years post operatively followed by every 6 months for 2 years, and then annually.  Educated pt that since her SOZO has been a  negative change past 2 times she doesn't have to wear her sleeve every day. More so during periods of highly repetitive activities. Pt verbalized understanding.    L-DEX FLOWSHEETS - 05/02/23 1600       L-DEX LYMPHEDEMA SCREENING   Measurement Type Unilateral    L-DEX MEASUREMENT EXTREMITY Upper Extremity    POSITION  Standing    DOMINANT SIDE Right    At Risk Side Left    BASELINE SCORE (UNILATERAL) 5    L-DEX SCORE (UNILATERAL) 1.9    VALUE CHANGE (UNILAT) -3.1               [name], PTA 05/02/2023, 4:03 PM

## 2023-05-12 ENCOUNTER — Telehealth: Admitting: Hematology and Oncology

## 2023-05-26 ENCOUNTER — Telehealth: Payer: Self-pay

## 2023-05-27 ENCOUNTER — Inpatient Hospital Stay: Attending: Hematology and Oncology | Admitting: Hematology and Oncology

## 2023-05-27 ENCOUNTER — Encounter: Payer: Self-pay | Admitting: Hematology and Oncology

## 2023-05-27 VITALS — BP 150/82 | HR 57 | Temp 97.6°F | Resp 18 | Wt 148.5 lb

## 2023-05-27 DIAGNOSIS — Z170 Estrogen receptor positive status [ER+]: Secondary | ICD-10-CM | POA: Insufficient documentation

## 2023-05-27 DIAGNOSIS — C50212 Malignant neoplasm of upper-inner quadrant of left female breast: Secondary | ICD-10-CM | POA: Diagnosis not present

## 2023-05-27 DIAGNOSIS — Z79811 Long term (current) use of aromatase inhibitors: Secondary | ICD-10-CM | POA: Diagnosis not present

## 2023-05-27 NOTE — Progress Notes (Signed)
 Janice Gross:    Pa, [facility] 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, estrogen receptor positive (HCC) Staging form: Breast, AJCC 8th Edition - Clinical stage from 11/04/2021: Stage IA (cT1c, cN0, cM0, G2, ER+, PR+, HER2-) - Signed by Bettejane Brownie, PA-C on 11/04/2021 Stage prefix: Initial diagnosis Method of lymph node assessment: Clinical Histologic grading system: 3 grade system - Pathologic: Stage IA (pT2, pN0(sn), cM0, G2, ER+, PR+, HER2-, Oncotype DX score: 19) - Signed by Bettejane Brownie, PA-C on 12/21/2021 Stage prefix: Initial diagnosis Method of lymph node assessment: Sentinel lymph node biopsy Multigene prognostic tests performed: Oncotype DX Recurrence score range: Greater than or equal to 11 Histologic grading system: 3 grade system   SUMMARY OF ONCOLOGIC HISTORY: Oncology History  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  Asymmetry in the left breast posterior depth superior region seen on the mediolateral oblique view only is indeterminate.  Diagnostic mammogram showed a 1 cm irregular mass with calcifications in the left breast, ultrasound was recommended.  Ultrasound confirmed a 1.2 x 0.5 cm irregular mass suspicious for malignancy.  No significant abnormalities in the axilla   10/29/2021 Pathology Results   Pathology results from the left breast biopsy showed invasive ductal carcinoma overall grade 2, prognostic showed ER 95% positive strong staining PR 40% positive strong staining, Ki-67 of 10% tumor cells are negative for HER2 1+ by IHC   11/04/2021 Cancer  Staging   Staging form: Breast, AJCC 8th Edition - Clinical stage from 11/04/2021: Stage IA (cT1c, cN0, cM0, G2, ER+, PR+, HER2-) - Signed by Bettejane Brownie, PA-C on 11/04/2021 Stage prefix: Initial diagnosis Method of lymph node assessment: Clinical Histologic grading system: 3 grade system   11/12/2021 Surgery   Left breast lumpectomy: IDC, g2, 3.6cm, margins neg, 9 SLN negative, T2, N0   11/12/2021 Oncotype testing   19/6%   12/21/2021 Cancer Staging   Staging form: Breast, AJCC 8th Edition - Pathologic: Stage IA (pT2, pN0(sn), cM0, G2, ER+, PR+, HER2-, Oncotype DX score: 19) - Signed by Bettejane Brownie, PA-C on 12/21/2021 Stage prefix: Initial diagnosis Method of lymph node assessment: Sentinel lymph node biopsy Multigene prognostic tests performed: Oncotype DX Recurrence score range: Greater than or equal to 11 Histologic grading system: 3 grade system   01/05/2022 - 02/01/2022 Radiation Therapy   Site Technique Total Dose (Gy) Dose per Fx (Gy) Completed Fx Beam Energies  Breast, Left: Breast_L 3D 42.56/42.56 2.66 16/16 10XFFF  Breast, Left: Breast_L_Bst 3D 8/8 2 4/4 6X, 10X     02/2022 -  Anti-estrogen oral therapy   Now on letrozole . Previously tried anastrozole , tamoxifen  and exemestane .     CURRENT THERAPY:  INTERVAL HISTORY:  Janice Gross 68 y.o. Gross,  with a history of breast cancer while on tamoxifen .  Discussed the use of AI scribe software for clinical note transcription with the patient, who gave verbal consent to proceed.  History of Present Illness Janice Gross is a 68 year old Gross with breast cancer who presents for follow-Gross regarding her current medication regimen.  Janice Gross has been on letrozole  for the past two months without any adverse effects. Previously, Janice Gross  experienced pelvic pain with tamoxifen , high blood pressure with anastrozole , and similar side effects with exemestane . An ultrasound and a mammogram in October were  normal, and her next mammogram is scheduled for October this year.  Janice Gross experienced a 'stinging type headache' that felt like a pin pricking sensation on her head. This occurred intermittently over a couple of days, lasting a few minutes each time, but has since resolved.  Janice Gross notes an increase in vaginal moisture since starting menopause, but it is not bothersome.  Janice Gross takes Miralax for constipation, which Janice Gross attributes to scar tissue, and reports regular bowel movements. Her blood pressure at home is 127/70 mmHg.  Janice Gross engages in physical activity by walking approximately half an hour daily, both in the morning and afternoon. Janice Gross reports a good appetite, normal breathing, and regular bowel movements.   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, estrogen receptor positive (HCC) 11/03/2021   Mitral valve prolapse 12/03/2015   Anxiety 05/08/2015   Essential hypertension 05/08/2015   Genetic testing 11/23/2013   Family history of breast cancer in first degree relative 08/09/2012   Vaginal atrophy 08/09/2012   Menopause syndrome 08/09/2012   Dyspepsia and other specified disorders of function of stomach 05/03/2012   History of neurological disorder 04/04/2012   Overweight 07/05/2011   Atrophic vaginitis 07/05/2011   Family history of breast cancer in mother 07/05/2011   Recurrent yeast infection 07/02/2011   ANEMIA 12/25/2008   CONSTIPATION 12/25/2008   NECK PAIN 12/25/2008   HEADACHE 12/25/2008   PALPITATIONS 12/25/2008   ANXIETY 03/24/2007   ALLERGIC RHINITIS 03/24/2007   GERD 03/24/2007   IRRITABLE BOWEL SYNDROME 03/24/2007   GESTATIONAL DIABETES 03/24/2007   MITRAL VALVE PROLAPSE, HX OF 03/24/2007    is allergic to ace inhibitors, aspirin, doxycycline, fluogen [influenza virus vaccine], hydrochlorothiazide , iodine, and other.  MEDICAL HISTORY: Past Medical History:  Diagnosis Date   Allergic  rhinitis    Anemia    pt states Janice Gross has never heard that Janice Gross had this   Anxiety    Constipation    Family history of adverse reaction to anesthesia    niece is hard to put to sleep and "sensitive to anesthesia"   GERD (gastroesophageal reflux disease)    Gestational diabetes    Headache(784.0)    Hypertension    IBS (irritable bowel syndrome)    Mitral valve prolapse    Neck pain    Neuromuscular disorder (HCC)    Pt reports takign FLU shot in the 90s which caused leg numbmess which has gone away, no tingling "from time to time"   Palpitations     SURGICAL HISTORY: Past Surgical History:  Procedure Laterality Date   BREAST LUMPECTOMY WITH RADIOACTIVE SEED AND SENTINEL LYMPH NODE BIOPSY Left 11/12/2021   Procedure: LEFT BREAST BRACKETED LUMPECTOMY WITH RADIOACTIVE SEED X2 AND SENTINEL LYMPH NODE BIOPSY;  Surgeon: Oza Blumenthal, MD;  Location: [facility] OR;  Service: General;  Laterality: Left;  60 MIN ROOM 2   c section x 1     COLONOSCOPY  2006   Dr. Frankey Isle    LAPAROSCOPY     surgery to remove ovarian cyst      SOCIAL HISTORY: Social History   Socioeconomic History   Marital status: Divorced    Spouse name: Not on file   Number of children: 1   Years of education: Not on file   Highest education level: Not on  file  Occupational History   Occupation: Biochemist, clinical: [employer]  Tobacco Use   Smoking status: Never   Smokeless tobacco: Never   Tobacco comments:    tried as a teenager  Vaping Use   Vaping status: Never Used  Substance and Sexual Activity   Alcohol use: No    Alcohol/week: 0.0 standard drinks of alcohol   Drug use: No   Sexual activity: Not Currently    Birth control/protection: None  Other Topics Concern   Not on file  Social History Narrative   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 - reports loves her job      Home Situation: lives alone      Spiritual Beliefs: Christian       Lifestyle:walking on a regular basis; diet is fair         Social Drivers of Corporate investment banker Strain: Low Risk  (11/04/2021)   Overall Financial Resource Strain (CARDIA)    Difficulty of Paying Living Expenses: Not very hard  Food Insecurity: No Food Insecurity (11/04/2021)   Hunger Vital Sign    Worried About Running Out of Food in the Last Year: Never true    Ran Out of Food in the Last Year: Never true  Transportation Needs: No Transportation Needs (11/04/2021)   PRAPARE - Administrator, Civil Service (Medical): No    Lack of Transportation (Non-Medical): No  Physical Activity: Not on file  Stress: Not on file  Social Connections: Not on file  Intimate Partner Violence: Not on file    FAMILY HISTORY: Family History  Problem Relation Age of Onset   Breast cancer Sister 64   Diabetes Sister    Breast cancer Sister 55   Diabetes Sister    Breast cancer Mother    Ovarian cancer Sister 7   Heart disease Father    Diabetes Brother    Lung cancer Brother    Breast cancer Other        dx in her 50s/dx in her 86s   Colon cancer Other 23   Lung cancer Other        dx in his 43s   Stomach cancer Neg Hx     Review of Systems  Constitutional:  Negative for appetite change, chills, fatigue, fever and unexpected weight change.  HENT:   Negative for hearing loss, lump/mass and trouble swallowing.   Eyes:  Negative for eye problems and icterus.  Respiratory:  Negative for chest tightness, cough and shortness of breath.   Cardiovascular:  Negative for chest pain, leg swelling and palpitations.  Gastrointestinal:  Negative for abdominal distention, abdominal pain, constipation, diarrhea, nausea and vomiting.  Endocrine: Negative for hot flashes.  Genitourinary:  Negative for difficulty urinating.   Musculoskeletal:  Negative for arthralgias.  Skin:  Negative for itching and rash.  Neurological:  Negative for dizziness, extremity weakness, headaches and  numbness.  Hematological:  Negative for adenopathy. Does not bruise/bleed easily.  Psychiatric/Behavioral:  Negative for depression. The patient is not nervous/anxious.       PHYSICAL EXAMINATION    Vitals:   05/27/23 1323 05/27/23 1324  BP: (!) 185/83 (!) 150/82  Pulse: (!) 57   Resp: 18   Temp: 97.6 F (36.4 C)   SpO2: 100%      General: Alert, oriented and in no acute distress BREAST: No concerns on exam. No regional adenopathy. CTA bilaterally. 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 Previous treatments discontinued due to side effects. Recent imaging satisfactory. - Continue letrozole  therapy. - Schedule mammogram for October.  Hypertension Hypertension well-controlled at 127/70 mmHg with current medication. - Continue current antihypertensive regimen. - Monitor blood pressure at home.  Constipation Constipation managed with Miralax, resulting in regular bowel movements. - Continue Miralax as needed.  Baseline bone density Aug 2024 normal. Repeat every 2 yrs.   Time spent: 30 min  All questions were answered. The patient knows to call the clinic with any problems, questions or concerns. We can certainly see the patient much sooner if necessary.  *Total Encounter Time as defined by the Centers for Medicare and Medicaid Services includes, in addition to the face-to-face time of a patient visit (documented in the note above) non-face-to-face time: obtaining and reviewing outside history, ordering and reviewing medications, tests or procedures, care coordination (communications with other health care professionals or caregivers) and documentation in the medical record.

## 2023-05-27 NOTE — Assessment & Plan Note (Addendum)
 Assessment and Plan Assessment & Plan Breast cancer Breast cancer managed with letrozole , well-tolerated and effective. Previous treatments discontinued due to side effects. Recent imaging satisfactory. - Continue letrozole  therapy. - Schedule mammogram for October.  Hypertension Hypertension well-controlled at 127/70 mmHg with current medication. - Continue current antihypertensive regimen. - Monitor blood pressure at home.  Constipation Constipation managed with Miralax, resulting in regular bowel movements. - Continue Miralax as needed.  Baseline bone density Aug 2024 normal. Repeat every 2 yrs.

## 2023-06-27 NOTE — Telephone Encounter (Signed)
 Called to confirm appt.

## 2023-08-01 ENCOUNTER — Ambulatory Visit: Attending: Surgery

## 2023-08-01 VITALS — Wt 147.2 lb

## 2023-08-01 DIAGNOSIS — Z483 Aftercare following surgery for neoplasm: Secondary | ICD-10-CM | POA: Insufficient documentation

## 2023-08-01 NOTE — Therapy (Signed)
 OUTPATIENT PHYSICAL THERAPY SOZO SCREENING NOTE   Patient Name: Janice Gross MRN: [id number] DOB:[birth year], 68 y.o., female Today's Date: 08/01/2023  PCP: [facility] REFERRING PROVIDER: Vernetta Berg, MD   PT End of Session - 08/01/23 1631     Visit Number 5   # unchanged due to screen only   PT Start Time 1630    PT Stop Time 1634    PT Time Calculation (min) 4 min    Activity Tolerance Patient tolerated treatment well    Behavior During Therapy WFL for tasks assessed/performed          Past Medical History:  Diagnosis Date   Allergic rhinitis    Anemia    pt states she has never heard that she had this   Anxiety    Constipation    Family history of adverse reaction to anesthesia    niece is hard to put to sleep and sensitive to anesthesia   GERD (gastroesophageal reflux disease)    Gestational diabetes    Headache(784.0)    Hypertension    IBS (irritable bowel syndrome)    Mitral valve prolapse    Neck pain    Neuromuscular disorder (HCC)    Pt reports takign FLU shot in the 90s which caused leg numbmess which has gone away, no tingling from time to time   Palpitations    Past Surgical History:  Procedure Laterality Date   BREAST LUMPECTOMY WITH RADIOACTIVE SEED AND SENTINEL LYMPH NODE BIOPSY Left 11/12/2021   Procedure: LEFT BREAST BRACKETED LUMPECTOMY WITH RADIOACTIVE SEED X2 AND SENTINEL LYMPH NODE BIOPSY;  Surgeon: Vernetta Berg, MD;  Location: [facility] OR;  Service: General;  Laterality: Left;  60 MIN ROOM 2   c section x 1     COLONOSCOPY  2006   Dr. Lorinda    LAPAROSCOPY     surgery to remove ovarian cyst     Patient Active Problem List   Diagnosis Date Noted   Malignant neoplasm of upper-inner quadrant of left breast in female, estrogen receptor positive (HCC) 11/03/2021   Mitral valve prolapse 12/03/2015   Anxiety 05/08/2015   Essential hypertension 05/08/2015   Genetic testing 11/23/2013   Family  history of breast cancer in first degree relative 08/09/2012   Vaginal atrophy 08/09/2012   Menopause syndrome 08/09/2012   Dyspepsia and other specified disorders of function of stomach 05/03/2012   History of neurological disorder 04/04/2012   Overweight 07/05/2011   Atrophic vaginitis 07/05/2011   Family history of breast cancer in mother 07/05/2011   Recurrent yeast infection 07/02/2011   ANEMIA 12/25/2008   CONSTIPATION 12/25/2008   NECK PAIN 12/25/2008   HEADACHE 12/25/2008   PALPITATIONS 12/25/2008   ANXIETY 03/24/2007   ALLERGIC RHINITIS 03/24/2007   GERD 03/24/2007   IRRITABLE BOWEL SYNDROME 03/24/2007   GESTATIONAL DIABETES 03/24/2007   MITRAL VALVE PROLAPSE, HX OF 03/24/2007    REFERRING DIAG: left breast cancer at risk for lymphedema  THERAPY DIAG:  Aftercare following surgery for neoplasm  PERTINENT HISTORY: Patient was diagnosed on 10/10/2021 with left grade 2 invasive ductal carcinoma breast cancer. It measures 1.2 cm and is located in the upper inner quadrant. It is ER/PR positive and HER2 negative with a Ki67 of 10%. 11/12/21- L breast lumpectomy and SLNB (0/9) . She has been having Sozo screens and had to wear a sleeve for 30 days.  PRECAUTIONS: left UE Lymphedema risk, None  SUBJECTIVE: Pt returns for her 3 month L-Dex screen.  PAIN:  Are you having pain? No  SOZO SCREENING: Patient was assessed today using the SOZO machine to determine the lymphedema index score. This was compared to her baseline score. It was determined that she is within the recommended range when compared to her baseline and no further action is needed at this time. She will continue SOZO screenings. These are done every 3 months for 2 years post operatively followed by every 6 months for 2 years, and then annually.    L-DEX FLOWSHEETS - 08/01/23 1600       L-DEX LYMPHEDEMA SCREENING   Measurement Type Unilateral    L-DEX MEASUREMENT EXTREMITY Upper Extremity    POSITION  Standing     DOMINANT SIDE Right    At Risk Side Left    BASELINE SCORE (UNILATERAL) 5    L-DEX SCORE (UNILATERAL) -2.7    VALUE CHANGE (UNILAT) -7.7          P: Begin 6 month L-Dex after next. Reminded pt she doesn't have to wear sleeve daily as her change has been low for awhile.  Aden Berwyn Caldron, PTA 08/01/2023, 4:33 PM

## 2023-08-04 DIAGNOSIS — E7800 Pure hypercholesterolemia, unspecified: Secondary | ICD-10-CM | POA: Diagnosis not present

## 2023-08-04 DIAGNOSIS — C50212 Malignant neoplasm of upper-inner quadrant of left female breast: Secondary | ICD-10-CM | POA: Diagnosis not present

## 2023-08-04 DIAGNOSIS — I10 Essential (primary) hypertension: Secondary | ICD-10-CM | POA: Diagnosis not present

## 2023-09-04 DIAGNOSIS — E7800 Pure hypercholesterolemia, unspecified: Secondary | ICD-10-CM | POA: Diagnosis not present

## 2023-09-04 DIAGNOSIS — I10 Essential (primary) hypertension: Secondary | ICD-10-CM | POA: Diagnosis not present

## 2023-09-04 DIAGNOSIS — C50212 Malignant neoplasm of upper-inner quadrant of left female breast: Secondary | ICD-10-CM | POA: Diagnosis not present

## 2023-10-04 DIAGNOSIS — E7800 Pure hypercholesterolemia, unspecified: Secondary | ICD-10-CM | POA: Diagnosis not present

## 2023-10-04 DIAGNOSIS — I10 Essential (primary) hypertension: Secondary | ICD-10-CM | POA: Diagnosis not present

## 2023-10-04 DIAGNOSIS — C50212 Malignant neoplasm of upper-inner quadrant of left female breast: Secondary | ICD-10-CM | POA: Diagnosis not present

## 2023-10-07 DIAGNOSIS — R7309 Other abnormal glucose: Secondary | ICD-10-CM | POA: Diagnosis not present

## 2023-10-07 DIAGNOSIS — E7800 Pure hypercholesterolemia, unspecified: Secondary | ICD-10-CM | POA: Diagnosis not present

## 2023-10-07 DIAGNOSIS — I10 Essential (primary) hypertension: Secondary | ICD-10-CM | POA: Diagnosis not present

## 2023-10-21 ENCOUNTER — Encounter: Payer: Self-pay | Admitting: Gastroenterology

## 2023-10-31 ENCOUNTER — Ambulatory Visit: Attending: Surgery

## 2023-10-31 VITALS — Wt 142.2 lb

## 2023-10-31 DIAGNOSIS — Z483 Aftercare following surgery for neoplasm: Secondary | ICD-10-CM | POA: Insufficient documentation

## 2023-10-31 NOTE — Therapy (Signed)
 OUTPATIENT PHYSICAL THERAPY SOZO SCREENING NOTE   Patient Name: Laurey Salser MRN: [id number] DOB:May 19, 1955, 68 y.o., female Today's Date: 10/31/2023  PCP: [facility] REFERRING PROVIDER: Vernetta Berg, MD   PT End of Session - 10/31/23 1649     Visit Number 5   # unchanged due to screen only   PT Start Time 1647    PT Stop Time 1651    PT Time Calculation (min) 4 min    Activity Tolerance Patient tolerated treatment well    Behavior During Therapy WFL for tasks assessed/performed          Past Medical History:  Diagnosis Date   Allergic rhinitis    Anemia    pt states she has never heard that she had this   Anxiety    Constipation    Family history of adverse reaction to anesthesia    niece is hard to put to sleep and sensitive to anesthesia   GERD (gastroesophageal reflux disease)    Gestational diabetes    Headache(784.0)    Hypertension    IBS (irritable bowel syndrome)    Mitral valve prolapse    Neck pain    Neuromuscular disorder (HCC)    Pt reports takign FLU shot in the 90s which caused leg numbmess which has gone away, no tingling from time to time   Palpitations    Past Surgical History:  Procedure Laterality Date   BREAST LUMPECTOMY WITH RADIOACTIVE SEED AND SENTINEL LYMPH NODE BIOPSY Left 11/12/2021   Procedure: LEFT BREAST BRACKETED LUMPECTOMY WITH RADIOACTIVE SEED X2 AND SENTINEL LYMPH NODE BIOPSY;  Surgeon: Vernetta Berg, MD;  Location: [facility] OR;  Service: General;  Laterality: Left;  60 MIN ROOM 2   c section x 1     COLONOSCOPY  2006   Dr. Lorinda    LAPAROSCOPY     surgery to remove ovarian cyst     Patient Active Problem List   Diagnosis Date Noted   Malignant neoplasm of upper-inner quadrant of left breast in female, estrogen receptor positive (HCC) 11/03/2021   Mitral valve prolapse 12/03/2015   Anxiety 05/08/2015   Essential hypertension 05/08/2015   Genetic testing 11/23/2013   Family  history of breast cancer in first degree relative 08/09/2012   Vaginal atrophy 08/09/2012   Menopause syndrome 08/09/2012   Dyspepsia and other specified disorders of function of stomach 05/03/2012   History of neurological disorder 04/04/2012   Overweight 07/05/2011   Atrophic vaginitis 07/05/2011   Family history of breast cancer in mother 07/05/2011   Recurrent yeast infection 07/02/2011   ANEMIA 12/25/2008   CONSTIPATION 12/25/2008   NECK PAIN 12/25/2008   HEADACHE 12/25/2008   PALPITATIONS 12/25/2008   ANXIETY 03/24/2007   ALLERGIC RHINITIS 03/24/2007   GERD 03/24/2007   IRRITABLE BOWEL SYNDROME 03/24/2007   GESTATIONAL DIABETES 03/24/2007   MITRAL VALVE PROLAPSE, HX OF 03/24/2007    REFERRING DIAG: left breast cancer at risk for lymphedema  THERAPY DIAG:  Aftercare following surgery for neoplasm  PERTINENT HISTORY: Patient was diagnosed on 10/10/2021 with left grade 2 invasive ductal carcinoma breast cancer. It measures 1.2 cm and is located in the upper inner quadrant. It is ER/PR positive and HER2 negative with a Ki67 of 10%. 11/12/21- L breast lumpectomy and SLNB (0/9) . She has been having Sozo screens and had to wear a sleeve for 30 days.  PRECAUTIONS: left UE Lymphedema risk, None  SUBJECTIVE: Pt returns for her last 3 month L-Dex screen.  PAIN:  Are you having pain? No  SOZO SCREENING: Patient was assessed today using the SOZO machine to determine the lymphedema index score. This was compared to her baseline score. It was determined that she is within the recommended range when compared to her baseline and no further action is needed at this time. She will continue SOZO screenings. These are done every 3 months for 2 years post operatively followed by every 6 months for 2 years, and then annually.    L-DEX FLOWSHEETS - 10/31/23 1600       L-DEX LYMPHEDEMA SCREENING   Measurement Type Unilateral    L-DEX MEASUREMENT EXTREMITY Upper Extremity    POSITION   Standing    DOMINANT SIDE Right    At Risk Side Left    BASELINE SCORE (UNILATERAL) 5    L-DEX SCORE (UNILATERAL) -0.8    VALUE CHANGE (UNILAT) -5.8          P: Cont every 6 month L-Dex screens until 11/2025 and then can transition to annual.    Aden Berwyn Caldron, PTA 10/31/2023, 4:50 PM

## 2023-11-02 DIAGNOSIS — R928 Other abnormal and inconclusive findings on diagnostic imaging of breast: Secondary | ICD-10-CM | POA: Diagnosis not present

## 2023-11-18 ENCOUNTER — Inpatient Hospital Stay: Admitting: Hematology and Oncology

## 2023-11-21 ENCOUNTER — Ambulatory Visit (AMBULATORY_SURGERY_CENTER)

## 2023-11-21 VITALS — Ht 60.0 in | Wt 145.0 lb

## 2023-11-21 DIAGNOSIS — Z1211 Encounter for screening for malignant neoplasm of colon: Secondary | ICD-10-CM

## 2023-11-21 MED ORDER — NA SULFATE-K SULFATE-MG SULF 17.5-3.13-1.6 GM/177ML PO SOLN
1.0000 | Freq: Once | ORAL | 0 refills | Status: AC
Start: 2023-11-21 — End: 2023-11-21

## 2023-11-21 NOTE — Progress Notes (Signed)
 PCP MD at time of PV: Cheryle Frees, MD  __________________________________________________________________________________________________________________________________________  No egg allergy known to patient  No soy allergy known to patient No issues known to pt with past sedation with any surgeries or procedures Patient denies ever being told they had issues or difficulty with intubation  No FH of Malignant Hyperthermia Pt is not on diet pills Pt is not on  home 02  Pt is not on blood thinners  No A fib or A flutter Have any cardiac testing pending-- no  LOA: independent  No Chew or Snuff tobacco __________________________________________________________________________________________________________________________________________  Constipation: no taking OTC miralax  Prep: suprep  __________________________________________________________________________________________________________________________________________  PV completed with patient. Prep instructions reviewed and provided during apt. Rx sent to preferred pharmacy.  __________________________________________________________________________________________________________________________________________  Patient's chart reviewed by Norleen Schillings CNRA prior to previsit and patient appropriate for the [facility].  Previsit completed and red dot placed by patient's name on their procedure day (on provider's schedule).

## 2023-11-25 ENCOUNTER — Inpatient Hospital Stay: Attending: Hematology and Oncology | Admitting: Hematology and Oncology

## 2023-11-25 VITALS — BP 146/74 | HR 86 | Temp 97.9°F | Resp 16 | Wt 146.4 lb

## 2023-11-25 DIAGNOSIS — M79603 Pain in arm, unspecified: Secondary | ICD-10-CM | POA: Diagnosis not present

## 2023-11-25 DIAGNOSIS — Z79811 Long term (current) use of aromatase inhibitors: Secondary | ICD-10-CM | POA: Diagnosis not present

## 2023-11-25 DIAGNOSIS — C50212 Malignant neoplasm of upper-inner quadrant of left female breast: Secondary | ICD-10-CM | POA: Diagnosis not present

## 2023-11-25 DIAGNOSIS — Z17411 Hormone receptor positive with human epidermal growth factor receptor 2 negative status: Secondary | ICD-10-CM | POA: Diagnosis not present

## 2023-11-25 DIAGNOSIS — Z170 Estrogen receptor positive status [ER+]: Secondary | ICD-10-CM | POA: Diagnosis not present

## 2023-11-25 NOTE — Progress Notes (Signed)
 [facility] Cancer Follow up:    [facility] [address], [address] [place] [address]   DIAGNOSIS:  Cancer Staging  Malignant neoplasm of upper-inner quadrant of left breast in female, estrogen receptor positive (HCC) Staging form: Breast, AJCC 8th Edition - Clinical stage from 11/04/2021: Stage IA (cT1c, cN0, cM0, G2, ER+, PR+, HER2-) - Signed by Lanell Donald Stagger, PA-C on 11/04/2021 Stage prefix: Initial diagnosis Method of lymph node assessment: Clinical Histologic grading system: 3 grade system - Pathologic: Stage IA (pT2, pN0(sn), cM0, G2, ER+, PR+, HER2-, Oncotype DX score: 19) - Signed by Lanell Donald Stagger, PA-C on 12/21/2021 Stage prefix: Initial diagnosis Method of lymph node assessment: Sentinel lymph node biopsy Multigene prognostic tests performed: Oncotype DX Recurrence score range: Greater than or equal to 11 Histologic grading system: 3 grade system   SUMMARY OF ONCOLOGIC HISTORY: Oncology History  Malignant neoplasm of upper-inner quadrant of left breast in female, estrogen receptor positive (HCC)  10/10/2021 Mammogram   Screening mammogram showed loosely grouped calcifications in the right breast at 3:00 posterior depth, indeterminate, additional views are recommended.  Asymmetry in the left breast posterior depth superior region seen on the mediolateral oblique view only is indeterminate.  Diagnostic mammogram showed a 1 cm irregular mass with calcifications in the left breast, ultrasound was recommended.  Ultrasound confirmed a 1.2 x 0.5 cm irregular mass suspicious for malignancy.  No significant abnormalities in the axilla   10/29/2021 Pathology Results   Pathology results from the left breast biopsy showed invasive ductal carcinoma overall grade 2, prognostic showed ER 95% positive strong staining PR 40% positive strong staining, Ki-67 of 10% tumor cells are negative for HER2 1+ by IHC   11/04/2021 Cancer  Staging   Staging form: Breast, AJCC 8th Edition - Clinical stage from 11/04/2021: Stage IA (cT1c, cN0, cM0, G2, ER+, PR+, HER2-) - Signed by Lanell Donald Stagger, PA-C on 11/04/2021 Stage prefix: Initial diagnosis Method of lymph node assessment: Clinical Histologic grading system: 3 grade system   11/12/2021 Surgery   Left breast lumpectomy: IDC, g2, 3.6cm, margins neg, 9 SLN negative, T2, N0   11/12/2021 Oncotype testing   19/6%   12/21/2021 Cancer Staging   Staging form: Breast, AJCC 8th Edition - Pathologic: Stage IA (pT2, pN0(sn), cM0, G2, ER+, PR+, HER2-, Oncotype DX score: 19) - Signed by Lanell Donald Stagger, PA-C on 12/21/2021 Stage prefix: Initial diagnosis Method of lymph node assessment: Sentinel lymph node biopsy Multigene prognostic tests performed: Oncotype DX Recurrence score range: Greater than or equal to 11 Histologic grading system: 3 grade system   01/05/2022 - 02/01/2022 Radiation Therapy   Site Technique Total Dose (Gy) Dose per Fx (Gy) Completed Fx Beam Energies  Breast, Left: Breast_L 3D 42.56/42.56 2.66 16/16 10XFFF  Breast, Left: Breast_L_Bst 3D 8/8 2 4/4 6X, 10X     02/2022 -  Anti-estrogen oral therapy   Now on letrozole . Previously tried anastrozole , tamoxifen  and exemestane .     CURRENT THERAPY: Letrozole   INTERVAL HISTORY:  Janice Gross 68 y.o. female,  with a history of breast cancer while on letrozole   Discussed the use of AI scribe software for clinical note transcription with the patient, who gave verbal consent to proceed.  History of Present Illness Janice Gross is a 68 year old female with breast cancer who presents for follow-up regarding her current treatment with letrozole .  She is tolerating letrozole  well, unlike previous medications such as tamoxifen  and exemestane , which  were not well tolerated. She takes letrozole  daily and continues her daily activities, including work.  She experiences sporadic arm pain that  occurs randomly and is not associated with any specific activity. The pain is not progressively worsening.  She was told she is not due for another bone density test until August 2026. She had a mammogram in October and was told it was all good.  She has issues with sleep, waking up frequently during the night, particularly around 3 AM. She goes to bed early, around 8 PM, which may contribute to her early waking. She feels tired in the afternoons.  She continues to work and is trying to incorporate more exercise into her routine, including riding a bike.  Rest of the pertinent 10 point ROS reviewed and neg.  Patient Active Problem List   Diagnosis Date Noted   Malignant neoplasm of upper-inner quadrant of left breast in female, estrogen receptor positive (HCC) 11/03/2021   Mitral valve prolapse 12/03/2015   Anxiety 05/08/2015   Essential hypertension 05/08/2015   Genetic testing 11/23/2013   Family history of breast cancer in first degree relative 08/09/2012   Vaginal atrophy 08/09/2012   Menopause syndrome 08/09/2012   Dyspepsia and other specified disorders of function of stomach 05/03/2012   History of neurological disorder 04/04/2012   Overweight 07/05/2011   Atrophic vaginitis 07/05/2011   Family history of breast cancer in mother 07/05/2011   Recurrent yeast infection 07/02/2011   ANEMIA 12/25/2008   CONSTIPATION 12/25/2008   NECK PAIN 12/25/2008   HEADACHE 12/25/2008   PALPITATIONS 12/25/2008   ANXIETY 03/24/2007   ALLERGIC RHINITIS 03/24/2007   GERD 03/24/2007   IRRITABLE BOWEL SYNDROME 03/24/2007   GESTATIONAL DIABETES 03/24/2007   MITRAL VALVE PROLAPSE, HX OF 03/24/2007    is allergic to ace inhibitors, aspirin, doxycycline, fluogen [influenza virus vaccine], hydrochlorothiazide , iodine, and other.  MEDICAL HISTORY: Past Medical History:  Diagnosis Date   Allergic rhinitis    Anemia    pt states she has never heard that she had this   Anxiety    Constipation     Family history of adverse reaction to anesthesia    niece is hard to put to sleep and sensitive to anesthesia   GERD (gastroesophageal reflux disease)    Gestational diabetes    Headache(784.0)    Hypertension    IBS (irritable bowel syndrome)    Mitral valve prolapse    Neck pain    Neuromuscular disorder (HCC)    Pt reports takign FLU shot in the 90s which caused leg numbmess which has gone away, no tingling from time to time   Palpitations     SURGICAL HISTORY: Past Surgical History:  Procedure Laterality Date   BREAST LUMPECTOMY WITH RADIOACTIVE SEED AND SENTINEL LYMPH NODE BIOPSY Left 11/12/2021   Procedure: LEFT BREAST BRACKETED LUMPECTOMY WITH RADIOACTIVE SEED X2 AND SENTINEL LYMPH NODE BIOPSY;  Surgeon: Vernetta Berg, MD;  Location: [facility] OR;  Service: General;  Laterality: Left;  60 MIN ROOM 2   c section x 1     COLONOSCOPY  2006   Dr. Lorinda    LAPAROSCOPY     surgery to remove ovarian cyst      SOCIAL HISTORY: Social History   Socioeconomic History   Marital status: Divorced    Spouse name: Not on file   Number of children: 1   Years of education: Not on file   Highest education level: Not on file  Occupational History   Occupation: CONTRACTOR  Employer: [employer]  Tobacco Use   Smoking status: Never   Smokeless tobacco: Never   Tobacco comments:    tried as a teenager  Vaping Use   Vaping status: Never Used  Substance and Sexual Activity   Alcohol use: No    Alcohol/week: 0.0 standard drinks of alcohol   Drug use: No   Sexual activity: Not Currently    Birth control/protection: None  Other Topics Concern   Not on file  Social History Narrative   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 - reports loves her job      Home Situation: lives alone      Spiritual Beliefs: Christian      Lifestyle:walking on a regular basis; diet is fair         Social Drivers of Manufacturing Engineer Strain: Low Risk  (11/04/2021)   Overall Financial Resource Strain (CARDIA)    Difficulty of Paying Living Expenses: Not very hard  Food Insecurity: No Food Insecurity (11/04/2021)   Hunger Vital Sign    Worried About Running Out of Food in the Last Year: Never true    Ran Out of Food in the Last Year: Never true  Transportation Needs: No Transportation Needs (11/04/2021)   PRAPARE - Administrator, Civil Service (Medical): No    Lack of Transportation (Non-Medical): No  Physical Activity: Not on file  Stress: Not on file  Social Connections: Not on file  Intimate Partner Violence: Not on file    FAMILY HISTORY: Family History  Problem Relation Age of Onset   Breast cancer Mother    Heart disease Father    Breast cancer Sister 55   Diabetes Sister    Breast cancer Sister 74   Diabetes Sister    Ovarian cancer Sister 24   Diabetes Brother    Lung cancer Brother    Breast cancer Other        dx in her 50s/dx in her 77s   Colon cancer Other 23   Lung cancer Other        dx in his 62s   Stomach cancer Neg Hx    Rectal cancer Neg Hx     Review of Systems  Constitutional:  Negative for appetite change, chills, fatigue, fever and unexpected weight change.  HENT:   Negative for hearing loss, lump/mass and trouble swallowing.   Eyes:  Negative for eye problems and icterus.  Respiratory:  Negative for chest tightness, cough and shortness of breath.   Cardiovascular:  Negative for chest pain, leg swelling and palpitations.  Gastrointestinal:  Negative for abdominal distention, abdominal pain, constipation, diarrhea, nausea and vomiting.  Endocrine: Negative for hot flashes.  Genitourinary:  Negative for difficulty urinating.   Musculoskeletal:  Negative for arthralgias.  Skin:  Negative for itching and rash.  Neurological:  Negative for dizziness, extremity weakness, headaches and numbness.  Hematological:  Negative for adenopathy. Does not  bruise/bleed easily.  Psychiatric/Behavioral:  Negative for depression. The patient is not nervous/anxious.       PHYSICAL EXAMINATION    Vitals:   11/25/23 0833  BP: (!) 146/74  Pulse: 86  Resp: 16  Temp: 97.9 F (36.6 C)  SpO2: 100%   General: Alert, oriented and in no acute distress BREAST: No concerns on exam. No regional adenopathy. CTA bilaterally. Heart RRR No LE edema  ASSESSMENT and THERAPY PLAN:   Assessment and Plan Assessment &  Plan Malignant neoplasm of upper-inner quadrant of left breast Currently tolerating letrozole  well. Sporadic arm pain noted, non-progressive. Recent mammogram normal ( will have to obtain a copy of the report from [place])  - Continue letrozole  daily. - Monitor arm pain for progression. - Request mammogram results from [facility]  Sleep disturbance Waking at 3 AM, fatigue by afternoon. No underlying pathology identified. - Advise gradual adjustment of sleep schedule to delay bedtime.  Ok to take multivitamin daily   Time spent: 20 min  All questions were answered. The patient knows to call the clinic with any problems, questions or concerns. We can certainly see the patient much sooner if necessary.  *Total Encounter Time as defined by the Centers for Medicare and Medicaid Services includes, in addition to the face-to-face time of a patient visit (documented in the note above) non-face-to-face time: obtaining and reviewing outside history, ordering and reviewing medications, tests or procedures, care coordination (communications with other health care professionals or caregivers) and documentation in the medical record.

## 2023-11-25 NOTE — Assessment & Plan Note (Signed)
 Assessment and Plan Assessment & Plan Breast cancer Breast cancer managed with letrozole , well-tolerated and effective. Previous treatments discontinued due to side effects. Recent imaging satisfactory. - Continue letrozole  therapy. - Schedule mammogram for October.   Baseline bone density Aug 2024 normal. Repeat every 2 yrs.

## 2023-11-28 ENCOUNTER — Encounter: Payer: Self-pay | Admitting: Hematology and Oncology

## 2023-12-05 ENCOUNTER — Encounter: Admitting: Gastroenterology

## 2024-01-12 ENCOUNTER — Encounter: Payer: Self-pay | Admitting: Gastroenterology

## 2024-01-23 ENCOUNTER — Ambulatory Visit: Admitting: Gastroenterology

## 2024-01-23 ENCOUNTER — Encounter: Payer: Self-pay | Admitting: Gastroenterology

## 2024-01-23 VITALS — BP 159/76 | HR 56 | Temp 97.0°F | Resp 16 | Ht 61.0 in | Wt 145.0 lb

## 2024-01-23 DIAGNOSIS — D175 Benign lipomatous neoplasm of intra-abdominal organs: Secondary | ICD-10-CM

## 2024-01-23 DIAGNOSIS — Z1211 Encounter for screening for malignant neoplasm of colon: Secondary | ICD-10-CM

## 2024-01-23 DIAGNOSIS — K5730 Diverticulosis of large intestine without perforation or abscess without bleeding: Secondary | ICD-10-CM

## 2024-01-23 DIAGNOSIS — K648 Other hemorrhoids: Secondary | ICD-10-CM | POA: Diagnosis not present

## 2024-01-23 MED ORDER — SODIUM CHLORIDE 0.9 % IV SOLN: 500.0000 mL | INTRAVENOUS | Status: DC

## 2024-01-23 NOTE — Patient Instructions (Addendum)
" -   Resume previous diet.  - Continue present medications.  - No repeat screening colonoscopy due to age,     current guidelines and low risk findings today.  YOU HAD AN ENDOSCOPIC PROCEDURE TODAY AT THE [facility]:   Refer to the procedure report that was given to you for any specific questions about what was found during the examination.  If the procedure report does not answer your questions, please call your gastroenterologist to clarify.  If you requested that your care partner not be given the details of your procedure findings, then the procedure report has been included in a sealed envelope for you to review at your convenience later.  YOU SHOULD EXPECT: Some feelings of bloating in the abdomen. Passage of more gas than usual.  Walking can help get rid of the air that was put into your GI tract during the procedure and reduce the bloating. If you had a lower endoscopy (such as a colonoscopy or flexible sigmoidoscopy) you may notice spotting of blood in your stool or on the toilet paper. If you underwent a bowel prep for your procedure, you may not have a normal bowel movement for a few days.  Please Note:  You might notice some irritation and congestion in your nose or some drainage.  This is from the oxygen used during your procedure.  There is no need for concern and it should clear up in a day or so.  SYMPTOMS TO REPORT IMMEDIATELY:  Following lower endoscopy (colonoscopy or flexible sigmoidoscopy):  Excessive amounts of blood in the stool  Significant tenderness or worsening of abdominal pains  Swelling of the abdomen that is new, acute  Fever of 100F or higher   For urgent or emergent issues, a gastroenterologist can be reached at any hour by calling [phone]. Do not use MyChart messaging for urgent concerns.    DIET:  We do recommend a small meal at first, but then you may proceed to your regular diet.  Drink plenty of fluids but you should avoid alcoholic  beverages for 24 hours.  ACTIVITY:  You should plan to take it easy for the rest of today and you should NOT DRIVE or use heavy machinery until tomorrow (because of the sedation medicines used during the test).    FOLLOW UP: Our staff will call the number listed on your records the next business day following your procedure.  We will call around 7:15- 8:00 am to check on you and address any questions or concerns that you may have regarding the information given to you following your procedure. If we do not reach you, we will leave a message.     If any biopsies were taken you will be contacted by phone or by letter within the next 1-3 weeks.  Please call us  at [phone] if you have not heard about the biopsies in 3 weeks.    SIGNATURES/CONFIDENTIALITY: You and/or your care partner have signed paperwork which will be entered into your electronic medical record.  These signatures attest to the fact that that the information above on your After Visit Summary has been reviewed and is understood.  Full responsibility of the confidentiality of this discharge information lies with you and/or your care-partner. "

## 2024-01-23 NOTE — Progress Notes (Signed)
 To pacu, VSS. Report to [email or web address]

## 2024-01-23 NOTE — Progress Notes (Signed)
 Pt's states no medical or surgical changes since previsit or office visit.

## 2024-01-23 NOTE — Op Note (Signed)
 [name] Procedure Date: 01/23/2024 9:46 AM MRN: [id number] Endoscopist: Victory L. Legrand , MD, [id number] Age: 69 Referring MD:  Date of Birth: [birth year] Gender: Female Account #: [id number] Procedure:                Colonoscopy Indications:              Screening for colorectal malignant neoplasm                           no polyps last colonoscopy Dec 2015 Medicines:                Monitored Anesthesia Care Procedure:                Pre-Anesthesia Assessment:                           - Prior to the procedure, a History and Physical                            was performed, and patient medications and                            allergies were reviewed. The patient's tolerance of                            previous anesthesia was also reviewed. The risks                            and benefits of the procedure and the sedation                            options and risks were discussed with the patient.                            All questions were answered, and informed consent                            was obtained. Prior Anticoagulants: The patient has                            taken no anticoagulant or antiplatelet agents. ASA                            Grade Assessment: II - A patient with mild systemic                            disease. After reviewing the risks and benefits,                            the patient was deemed in satisfactory condition to                            undergo the procedure.  After obtaining informed consent, the colonoscope                            was passed under direct vision. Throughout the                            procedure, the patient's blood pressure, pulse, and                            oxygen saturations were monitored continuously. The                            CF [id number] #[id number] was introduced through the anus                            and advanced to the the  cecum, identified by                            appendiceal orifice and ileocecal valve. The                            colonoscopy was performed without difficulty. The                            patient tolerated the procedure well. The quality                            of the bowel preparation was excellent. The                            ileocecal valve, appendiceal orifice, and rectum                            were photographed. Scope In: 9:59:36 AM Scope Out: 10:09:51 AM Scope Withdrawal Time: 0 hours 8 minutes 4 seconds  Total Procedure Duration: 0 hours 10 minutes 15 seconds  Findings:                 The perianal and digital rectal examinations were                            normal.                           Repeat examination of right colon under NBI                            performed.                           A few diverticula were found in the right colon.                           Internal hemorrhoids were found.  There was a small lipoma, in the sigmoid colon.                           The exam was otherwise without abnormality on                            direct and retroflexion views. Complications:            No immediate complications. Estimated Blood Loss:     Estimated blood loss: none. Impression:               - Diverticulosis in the right colon.                           - Internal hemorrhoids.                           - Small lipoma in the sigmoid colon.                           - The examination was otherwise normal on direct                            and retroflexion views.                           - No specimens collected. Recommendation:           - Patient has a contact number available for                            emergencies. The signs and symptoms of potential                            delayed complications were discussed with the                            patient. Return to normal activities tomorrow.                             Written discharge instructions were provided to the                            patient.                           - Resume previous diet.                           - Continue present medications.                           - No repeat screening colonoscopy due to age,                            current guidelines and low risk findings today. [name] L. Legrand, MD 01/23/2024 10:15:54 AM This report has been signed electronically.

## 2024-01-23 NOTE — Progress Notes (Signed)
 History and Physical:  This patient presents for endoscopic testing for: Encounter Diagnosis  Name Primary?   Special screening for malignant neoplasms, colon Yes    69 year old woman here today for screening colonoscopy.  No polyps on last colonoscopy December 2015 (Dr. Teressa)  Patient denies chronic abdominal pain, rectal bleeding, constipation or diarrhea.   Patient is otherwise without complaints or active issues today.   Past Medical History: Past Medical History:  Diagnosis Date   Allergic rhinitis    Anemia    pt states she has never heard that she had this   Anxiety    Constipation    Family history of adverse reaction to anesthesia    niece is hard to put to sleep and sensitive to anesthesia   GERD (gastroesophageal reflux disease)    Gestational diabetes    Headache(784.0)    Hypertension    IBS (irritable bowel syndrome)    Mitral valve prolapse    Neck pain    Neuromuscular disorder (HCC)    Pt reports takign FLU shot in the 90s which caused leg numbmess which has gone away, no tingling from time to time   Palpitations      Past Surgical History: Past Surgical History:  Procedure Laterality Date   BREAST LUMPECTOMY WITH RADIOACTIVE SEED AND SENTINEL LYMPH NODE BIOPSY Left 11/12/2021   Procedure: LEFT BREAST BRACKETED LUMPECTOMY WITH RADIOACTIVE SEED X2 AND SENTINEL LYMPH NODE BIOPSY;  Surgeon: Vernetta Berg, MD;  Location: [facility] OR;  Service: General;  Laterality: Left;  60 MIN ROOM 2   c section x 1     COLONOSCOPY  2006   Dr. Lorinda    LAPAROSCOPY     surgery to remove ovarian cyst      Allergies: Allergies[1]  Outpatient Meds: Current Outpatient Medications  Medication Sig Dispense Refill   atenolol  (TENORMIN ) 25 MG tablet Take 25 mg by mouth 2 (two) times daily.     Calcium Carbonate-Vitamin D  (CALTRATE 600+D) 600-400 MG-UNIT per tablet Take 1 tablet by mouth daily.     cephALEXin (KEFLEX) 500 MG capsule Take 500 mg by mouth 2 (two)  times daily.     rosuvastatin (CRESTOR) 10 MG tablet 1 tablet Orally Once a day     letrozole  (FEMARA ) 2.5 MG tablet Take 1 tablet (2.5 mg total) by mouth daily. 90 tablet 3   Misc Natural Products (ELDERBERRY IMMUNE COMPLEX) CHEW as directed Orally     Naphazoline-Pheniramine (OPCON-A OP) Place 1 drop into both eyes daily as needed (dry/irritated eyes).     Current Facility-Administered Medications  Medication Dose Route Frequency Provider Last Rate Last Admin   0.9 %  sodium chloride  infusion  500 mL Intravenous Continuous Cirigliano, Vito V, DO          ___________________________________________________________________ Objective   Exam:  BP (!) 145/85   Pulse 62   Temp (!) 97 F (36.1 C) (Temporal)   Ht 5' 1 (1.549 m)   Wt 145 lb (65.8 kg)   SpO2 99%   BMI 27.40 kg/m   CV: regular , S1/S2 Resp: clear to auscultation bilaterally, normal RR and effort noted GI: soft, no tenderness, with active bowel sounds.   Assessment: Encounter Diagnosis  Name Primary?   Special screening for malignant neoplasms, colon Yes     Plan: Colonoscopy   The benefits and risks of the planned procedure(s) were described in detail with the patient or (when appropriate) their health care proxy.  Risks were outlined as including, but  not limited to, bleeding, infection, perforation, adverse medication reaction leading to cardiac or pulmonary decompensation, pancreatitis (if ERCP).  The limitation of incomplete mucosal visualization was also discussed.  No guarantees or warranties were given.  The patient was provided an opportunity to ask questions and all were answered. The patient agreed with the plan.   The patient is appropriate for an endoscopic procedure in the ambulatory setting.   - Victory Brand, MD        [1]  Allergies Allergen Reactions   Fluogen [Influenza Virus Vaccine] Other (See Comments)    Caused leg numbness and tingling in the 90s after flu shot    Hydrochlorothiazide  Other (See Comments)    Other Reaction(s): hair loss   Ace Inhibitors Cough   Aspirin Other (See Comments)    Makes her lower back hurt   Doxycycline Nausea And Vomiting   Iodine Other (See Comments)    shaking   Other Nausea And Vomiting    Strong pain medications cause nausea and vomiting and constipation

## 2024-01-24 ENCOUNTER — Telehealth: Payer: Self-pay

## 2024-01-24 NOTE — Telephone Encounter (Signed)
 Attempted to reach patient for follow up phone call. No answer, left voicemail to contact Dr. Clayburn office with any questions or concerns.

## 2024-04-30 ENCOUNTER — Ambulatory Visit: Payer: Self-pay | Attending: Surgery

## 2024-11-16 ENCOUNTER — Inpatient Hospital Stay: Admitting: Hematology and Oncology
